# Patient Record
Sex: Female | Born: 1965
Health system: Southern US, Community
[De-identification: ages and names within clinical notes are randomized; demographics above are authoritative.]

## PROBLEM LIST (undated history)

## (undated) DIAGNOSIS — F32A Depression, unspecified: Secondary | ICD-10-CM

## (undated) DIAGNOSIS — K219 Gastro-esophageal reflux disease without esophagitis: Secondary | ICD-10-CM

## (undated) DIAGNOSIS — K5140 Inflammatory polyps of colon without complications: Secondary | ICD-10-CM

## (undated) DIAGNOSIS — I10 Essential (primary) hypertension: Secondary | ICD-10-CM

## (undated) DIAGNOSIS — C44310 Basal cell carcinoma of skin of unspecified parts of face: Secondary | ICD-10-CM

## (undated) DIAGNOSIS — G4730 Sleep apnea, unspecified: Secondary | ICD-10-CM

## (undated) DIAGNOSIS — C4359 Malignant melanoma of other part of trunk: Secondary | ICD-10-CM

## (undated) DIAGNOSIS — F329 Major depressive disorder, single episode, unspecified: Secondary | ICD-10-CM

## (undated) HISTORY — DX: Basal cell carcinoma of skin of unspecified parts of face: C44.310

## (undated) HISTORY — DX: Malignant melanoma of other part of trunk: C43.59

## (undated) HISTORY — DX: Inflammatory polyps of colon without complications: K51.40

## (undated) HISTORY — DX: Gastro-esophageal reflux disease without esophagitis: K21.9

## (undated) HISTORY — DX: Depression, unspecified: F32.A

## (undated) HISTORY — DX: Essential (primary) hypertension: I10

## (undated) HISTORY — DX: Major depressive disorder, single episode, unspecified: F32.9

## (undated) HISTORY — DX: Sleep apnea, unspecified: G47.30

---
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
## 2003-06-21 HISTORY — PX: CHOLECYSTECTOMY: SHX55

## 2008-11-17 ENCOUNTER — Ambulatory Visit: Payer: Self-pay | Admitting: Unknown Physician Specialty

## 2009-04-18 ENCOUNTER — Ambulatory Visit: Payer: Self-pay | Admitting: Internal Medicine

## 2009-04-29 ENCOUNTER — Ambulatory Visit: Payer: Self-pay | Admitting: Internal Medicine

## 2009-07-14 ENCOUNTER — Ambulatory Visit: Payer: Self-pay | Admitting: Internal Medicine

## 2009-08-12 ENCOUNTER — Ambulatory Visit: Payer: Self-pay | Admitting: Family Medicine

## 2010-01-26 ENCOUNTER — Ambulatory Visit: Payer: Self-pay | Admitting: Sports Medicine

## 2014-04-25 LAB — HM COLONOSCOPY

## 2014-06-24 DIAGNOSIS — R159 Full incontinence of feces: Secondary | ICD-10-CM | POA: Insufficient documentation

## 2014-06-24 DIAGNOSIS — C44319 Basal cell carcinoma of skin of other parts of face: Secondary | ICD-10-CM | POA: Insufficient documentation

## 2014-06-24 DIAGNOSIS — K6289 Other specified diseases of anus and rectum: Secondary | ICD-10-CM | POA: Insufficient documentation

## 2014-12-25 ENCOUNTER — Ambulatory Visit: Payer: Self-pay | Admitting: Urology

## 2014-12-29 ENCOUNTER — Ambulatory Visit: Payer: Self-pay

## 2015-11-02 ENCOUNTER — Ambulatory Visit (INDEPENDENT_AMBULATORY_CARE_PROVIDER_SITE_OTHER): Payer: 59 | Admitting: Internal Medicine

## 2015-11-02 ENCOUNTER — Encounter: Payer: Self-pay | Admitting: Internal Medicine

## 2015-11-02 VITALS — BP 134/80 | HR 84 | Ht 64.0 in | Wt 194.0 lb

## 2015-11-02 DIAGNOSIS — R05 Cough: Secondary | ICD-10-CM

## 2015-11-02 DIAGNOSIS — G4733 Obstructive sleep apnea (adult) (pediatric): Secondary | ICD-10-CM | POA: Diagnosis not present

## 2015-11-02 DIAGNOSIS — R0602 Shortness of breath: Secondary | ICD-10-CM | POA: Diagnosis not present

## 2015-11-02 DIAGNOSIS — Z86010 Personal history of colonic polyps: Secondary | ICD-10-CM | POA: Insufficient documentation

## 2015-11-02 DIAGNOSIS — G4730 Sleep apnea, unspecified: Secondary | ICD-10-CM | POA: Diagnosis not present

## 2015-11-02 DIAGNOSIS — R059 Cough, unspecified: Secondary | ICD-10-CM | POA: Insufficient documentation

## 2015-11-02 DIAGNOSIS — Z79899 Other long term (current) drug therapy: Secondary | ICD-10-CM | POA: Insufficient documentation

## 2015-11-02 MED ORDER — FLUTICASONE PROPIONATE HFA 110 MCG/ACT IN AERO: 2.0000 | INHALATION_SPRAY | Freq: Two times a day (BID) | RESPIRATORY_TRACT | Status: DC

## 2015-11-02 MED ORDER — GLYCOPYRROLATE-FORMOTEROL 9-4.8 MCG/ACT IN AERO: 2.0000 | INHALATION_SPRAY | Freq: Two times a day (BID) | RESPIRATORY_TRACT | Status: AC

## 2015-11-02 MED ORDER — GLYCOPYRROLATE-FORMOTEROL 9-4.8 MCG/ACT IN AERO: 2.0000 | INHALATION_SPRAY | Freq: Two times a day (BID) | RESPIRATORY_TRACT | Status: DC

## 2015-11-02 NOTE — Patient Instructions (Signed)

## 2015-11-02 NOTE — Progress Notes (Deleted)
.  [id number]      REVIEW OF SYSTEMS   Review of Systems  Constitutional: Negative for fever, chills, weight loss, malaise/fatigue and diaphoresis.  HENT: Negative for congestion and hearing loss.   Eyes: Negative for blurred vision and double vision.  Respiratory: Positive for cough, shortness of breath and wheezing.   Cardiovascular: Negative for chest pain, palpitations and orthopnea.  Gastrointestinal: Negative for heartburn, nausea, vomiting, abdominal pain, diarrhea, constipation and blood in stool.  Genitourinary: Negative for dysuria and urgency.  Musculoskeletal: Negative for myalgias, back pain and neck pain.  Skin: Negative for rash.  Neurological: Negative for dizziness, tingling, tremors, weakness and headaches.  Endo/Heme/Allergies: Does not bruise/bleed easily.  Psychiatric/Behavioral: Negative for depression, suicidal ideas and substance abuse.  All other systems reviewed and are negative.    VS: BP 134/80 mmHg  Pulse 84  Ht 5\' 4"  (1.626 m)  Wt 194 lb (87.998 kg)  BMI 33.28 kg/m2  SpO2 97%     PHYSICAL EXAM  Physical Exam  Constitutional: She is oriented to person, place, and time. She appears well-developed and well-nourished. She appears distressed.  HENT:  Head: Normocephalic and atraumatic.  Mouth/Throat: No oropharyngeal exudate.  Eyes: EOM are normal. Pupils are equal, round, and reactive to light. No scleral icterus.  Neck: Normal range of motion. Neck supple.  Cardiovascular: Normal rate, regular rhythm and normal heart sounds.   No murmur heard. Pulmonary/Chest: No stridor. She is in respiratory distress. She has wheezes.  Abdominal: Soft. Bowel sounds are normal. She exhibits no distension. There is no tenderness. There is no rebound.  Musculoskeletal: Normal range of motion. She exhibits no edema.  Neurological: She is alert and oriented to person, place, and time. She displays normal reflexes. Coordination normal.  Skin: Skin is warm. She is  diaphoretic.  Psychiatric: She has a normal mood and affect.

## 2015-11-02 NOTE — Progress Notes (Signed)
[facility] Consultation      Date: 11/02/2015,   MRN# [id number] CHAIRTY Terrell [birth year] Code Status:  Code Status History    This patient does not have a recorded code status. Please follow your organizational policy for patients in this situation.     Hosp day:@LENGTHOFSTAYDAYS @ Referring MD: @ATDPROV @     PCP:      AdmissionWeight: 194 lb (87.998 kg)                 CurrentWeight: 194 lb (87.998 kg) Brandi Terrell is a 50 y.o. old female seen in consultation for cough at the request of [place]:  Cough/SOB   HISTORY OF PRESENT ILLNESS  50 yo white female seen today for chronic SOB and DOE. Her symptoms have been going on for many years Has has been noticing that it has been progressively worsening over the past several months  No associated leg swelling. No sign so infection at this time  Patient had recent, what seems to be a viral URI 2 months ago and now has associated  residual cough with productive white phlegm with intermittent wheezing Patient has known allergic rhinitis and is NON compliant with antihistamine and intranasal steroids Patient has known dx of OSA and is noncompliant with CPAP  patient had been given albuterol inhaler which does not seem to work, patient has been given prednisone and abx and that seemed to have helped Patient notes that she has seen ENT for allergies and is noncompliant with their recs She is non-smoker, however has been exposed to second hand smoke for 20 years Patient also has GERD takes PPI, also on ARB for HTN  She is nonsmoker, howevere  PAST MEDICAL HISTORY   Past Medical History  Diagnosis Date  . Hypertension   . Sleep apnea      SURGICAL HISTORY   Past Surgical History  Procedure Laterality Date  . Cholecystectomy    . Appendectomy       FAMILY HISTORY   Family History  Problem Relation Age of Onset  . Hypertension Mother   . Skin cancer Father     . Hypertension Father   . Stroke Maternal Grandmother   . Stroke Paternal Grandmother      SOCIAL HISTORY   Social History  Substance Use Topics  . Smoking status: Never Smoker   . Smokeless tobacco: Never Used  . Alcohol Use: Yes     Comment: occ  second hand smoke exposure   MEDICATIONS    Home Medication:  Current Outpatient Rx  Name  Route  Sig  Dispense  Refill  . amLODipine-valsartan (EXFORGE) 10-160 MG tablet   Oral   Take by mouth.         . Aspirin-Acetaminophen-Caffeine (EXCEDRIN EXTRA STRENGTH PO)   Oral   Take by mouth.         . estradiol (VIVELLE-DOT) 0.1 MG/24HR patch      PLACE 1 PATCH ONTO THE SKIN TWICE A WEEK.      11   . fluticasone (FLONASE) 50 MCG/ACT nasal spray   Each Nare   Place into both nostrils daily.         Marland Kitchen lamoTRIgine (LAMICTAL) 100 MG tablet   Oral   Take 100 mg by mouth.         . metoprolol succinate (TOPROL-XL) 100 MG 24 hr tablet   Oral   Take 100 mg by mouth.         Marland Kitchen  progesterone (PROMETRIUM) 100 MG capsule   Oral   Take 100 mg by mouth.         . zolpidem (AMBIEN) 5 MG tablet   Oral   Take 7.5 mg by mouth.         . fluticasone (FLOVENT HFA) 110 MCG/ACT inhaler   Inhalation   Inhale 2 puffs into the lungs 2 (two) times daily.   1 Inhaler   2   . Glycopyrrolate-Formoterol (BEVESPI AEROSPHERE) 9-4.8 MCG/ACT AERO   Inhalation   Inhale 2 puffs into the lungs 2 (two) times daily.   4.8 g   2   . Glycopyrrolate-Formoterol (BEVESPI AEROSPHERE) 9-4.8 MCG/ACT AERO   Inhalation   Inhale 2 puffs into the lungs 2 (two) times daily.   4.8 g   0     Current Medication:  Current outpatient prescriptions:  .  amLODipine-valsartan (EXFORGE) 10-160 MG tablet, Take by mouth., Disp: , Rfl:  .  Aspirin-Acetaminophen-Caffeine (EXCEDRIN EXTRA STRENGTH PO), Take by mouth., Disp: , Rfl:  .  estradiol (VIVELLE-DOT) 0.1 MG/24HR patch, PLACE 1 PATCH ONTO THE SKIN TWICE A WEEK., Disp: , Rfl: 11 .   fluticasone (FLONASE) 50 MCG/ACT nasal spray, Place into both nostrils daily., Disp: , Rfl:  .  lamoTRIgine (LAMICTAL) 100 MG tablet, Take 100 mg by mouth., Disp: , Rfl:  .  metoprolol succinate (TOPROL-XL) 100 MG 24 hr tablet, Take 100 mg by mouth., Disp: , Rfl:  .  progesterone (PROMETRIUM) 100 MG capsule, Take 100 mg by mouth., Disp: , Rfl:  .  zolpidem (AMBIEN) 5 MG tablet, Take 7.5 mg by mouth., Disp: , Rfl:  .  fluticasone (FLOVENT HFA) 110 MCG/ACT inhaler, Inhale 2 puffs into the lungs 2 (two) times daily., Disp: 1 Inhaler, Rfl: 2 .  Glycopyrrolate-Formoterol (BEVESPI AEROSPHERE) 9-4.8 MCG/ACT AERO, Inhale 2 puffs into the lungs 2 (two) times daily., Disp: 4.8 g, Rfl: 2 .  Glycopyrrolate-Formoterol (BEVESPI AEROSPHERE) 9-4.8 MCG/ACT AERO, Inhale 2 puffs into the lungs 2 (two) times daily., Disp: 4.8 g, Rfl: 0    ALLERGIES   Sulfa antibiotics and Cephalosporins     REVIEW OF SYSTEMS   Review of Systems  Constitutional: Positive for malaise/fatigue. Negative for fever, chills, weight loss and diaphoresis.  HENT: Positive for congestion and ear pain. Negative for hearing loss.   Eyes: Negative for blurred vision and double vision.  Respiratory: Positive for cough, sputum production, shortness of breath and wheezing. Negative for hemoptysis.   Cardiovascular: Negative for chest pain, palpitations, orthopnea and leg swelling.  Gastrointestinal: Negative for heartburn, nausea, vomiting and abdominal pain.  Genitourinary: Negative for dysuria and urgency.  Musculoskeletal: Negative for myalgias.  Skin: Negative for rash.  Neurological: Positive for headaches. Negative for dizziness and weakness.  Endo/Heme/Allergies: Does not bruise/bleed easily.  Psychiatric/Behavioral: Negative for depression, suicidal ideas and substance abuse. The patient is nervous/anxious.   All other systems reviewed and are negative.    VS: BP 134/80 mmHg  Pulse 84  Ht 5\' 4"  (1.626 m)  Wt 194 lb  (87.998 kg)  BMI 33.28 kg/m2  SpO2 97%     PHYSICAL EXAM  Physical Exam  Constitutional: She is oriented to person, place, and time. She appears well-developed and well-nourished. No distress.  HENT:  Head: Normocephalic and atraumatic.  Mouth/Throat: No oropharyngeal exudate.  Eyes: EOM are normal. Pupils are equal, round, and reactive to light. No scleral icterus.  Neck: Normal range of motion. Neck supple.  Cardiovascular: Normal rate, regular rhythm and  normal heart sounds.   No murmur heard. Pulmonary/Chest: No stridor. No respiratory distress. She has no wheezes. She has no rales.  Abdominal: Soft. Bowel sounds are normal.  Musculoskeletal: Normal range of motion. She exhibits no edema.  Neurological: She is alert and oriented to person, place, and time. No cranial nerve deficit.  Skin: Skin is warm. She is not diaphoretic.  Psychiatric: She has a normal mood and affect.            ASSESSMENT/PLAN   50 yo white female seen today for cough with signs and symptoms of allergic rhinitis, with probable underlying reactive airways disease or COPD  1.will need to check PFT/6MWT 2.check ECHO and will consider cardiology consult for stress testing 3.check ONO, recommend restarting CPAP therapy 4.continue PPI 5.recommend antihisamine daily and ntramnasal steroids daily 6.albuterol as needed 7.will start LABA/AC with Bevespi 8.will start  Inhaled steroids with Flovent  Follow up in 1 month for re-assessment, will also need to consider stopping ARB of symptoms persist    The Patient requires high complexity decision making for assessment and support, frequent evaluation and titration of therapies, application of advanced monitoring technologies and extensive interpretation of multiple databases.   Patient  satisfied with Plan of action and management. All questions answered  Corrin Parker, M.D.  [facility]  Medical Director [place] Director [facility] Cardio-Pulmonary Department

## 2015-11-06 ENCOUNTER — Encounter: Payer: Self-pay | Admitting: Urology

## 2015-11-08 ENCOUNTER — Encounter: Payer: Self-pay | Admitting: Internal Medicine

## 2015-11-09 NOTE — Telephone Encounter (Signed)
Pt was not able to send mychart message directly to you.

## 2015-11-17 ENCOUNTER — Other Ambulatory Visit: Payer: 59

## 2015-11-17 ENCOUNTER — Telehealth: Payer: Self-pay

## 2015-11-17 DIAGNOSIS — G4730 Sleep apnea, unspecified: Secondary | ICD-10-CM

## 2015-11-17 NOTE — Telephone Encounter (Signed)
Pt informed of need for 2L 02 @ bedtime. Order placed. Nothing further needed.

## 2015-11-18 ENCOUNTER — Other Ambulatory Visit: Payer: Self-pay

## 2015-11-18 DIAGNOSIS — J449 Chronic obstructive pulmonary disease, unspecified: Secondary | ICD-10-CM

## 2015-11-20 ENCOUNTER — Other Ambulatory Visit: Payer: Self-pay

## 2015-11-20 ENCOUNTER — Ambulatory Visit (INDEPENDENT_AMBULATORY_CARE_PROVIDER_SITE_OTHER): Payer: 59

## 2015-11-20 DIAGNOSIS — R0602 Shortness of breath: Secondary | ICD-10-CM | POA: Diagnosis not present

## 2015-11-25 ENCOUNTER — Ambulatory Visit (INDEPENDENT_AMBULATORY_CARE_PROVIDER_SITE_OTHER): Payer: 59 | Admitting: *Deleted

## 2015-11-25 DIAGNOSIS — R0600 Dyspnea, unspecified: Secondary | ICD-10-CM

## 2015-11-25 DIAGNOSIS — R0602 Shortness of breath: Secondary | ICD-10-CM | POA: Diagnosis not present

## 2015-11-25 DIAGNOSIS — J449 Chronic obstructive pulmonary disease, unspecified: Secondary | ICD-10-CM

## 2015-11-25 LAB — PULMONARY FUNCTION TEST
DL/VA % PRED: 82 %
DL/VA: 3.98 ml/min/mmHg/L
DLCO UNC % PRED: 110 %
DLCO unc: 26.8 ml/min/mmHg
FEF 25-75 PRE: 2.6 L/s
FEF 25-75 Post: 1.52 L/sec
FEF2575-%CHANGE-POST: -41 %
FEF2575-%PRED-PRE: 97 %
FEF2575-%Pred-Post: 57 %
FEV1-%CHANGE-POST: -24 %
FEV1-%PRED-POST: 67 %
FEV1-%Pred-Pre: 89 %
FEV1-PRE: 2.46 L
FEV1-Post: 1.85 L
FEV1FVC-%Change-Post: -22 %
FEV1FVC-%Pred-Pre: 102 %
FEV6-%CHANGE-POST: -1 %
FEV6-%PRED-POST: 85 %
FEV6-%PRED-PRE: 86 %
FEV6-POST: 2.9 L
FEV6-PRE: 2.95 L
FEV6FVC-%PRED-PRE: 103 %
FEV6FVC-%Pred-Post: 103 %
FVC-%CHANGE-POST: -3 %
FVC-%Pred-Post: 82 %
FVC-%Pred-Pre: 85 %
FVC-Post: 2.9 L
FVC-Pre: 2.99 L
POST FEV1/FVC RATIO: 64 %
POST FEV6/FVC RATIO: 100 %
PRE FEV6/FVC RATIO: 100 %
Pre FEV1/FVC ratio: 82 %

## 2015-11-25 NOTE — Progress Notes (Signed)
PFT performed today with Nitrogen washout. 

## 2015-11-25 NOTE — Progress Notes (Signed)
SMW performed today. 

## 2015-12-01 ENCOUNTER — Encounter: Payer: Self-pay | Admitting: Internal Medicine

## 2015-12-01 ENCOUNTER — Ambulatory Visit (INDEPENDENT_AMBULATORY_CARE_PROVIDER_SITE_OTHER): Payer: 59 | Admitting: Internal Medicine

## 2015-12-01 VITALS — BP 124/88 | HR 78 | Ht 64.0 in | Wt 190.0 lb

## 2015-12-01 DIAGNOSIS — J45909 Unspecified asthma, uncomplicated: Secondary | ICD-10-CM

## 2015-12-01 DIAGNOSIS — I5032 Chronic diastolic (congestive) heart failure: Secondary | ICD-10-CM

## 2015-12-01 MED ORDER — CETIRIZINE HCL 10 MG PO TABS: 10.0000 mg | ORAL_TABLET | Freq: Every day | ORAL | Status: DC

## 2015-12-01 NOTE — Progress Notes (Signed)
[facility] Consultation      Date: 12/01/2015,   MRN# [id number] Brandi Terrell [birth year] Code Status:  Code Status History    This patient does not have a recorded code status. Please follow your organizational policy for patients in this situation.     Hosp day:@LENGTHOFSTAYDAYS @ Referring MD: @ATDPROV @     PCP:      Admission                  Current  Brandi Terrell is a 50 y.o. old female seen in consultation for cough at the request of [place]:  Follow up Cough/SOB   HISTORY OF PRESENT ILLNESS  50 yo white female seen today for chronic SOB and DOE. Her symptoms have been going on for many years Has has been noticing that it has been progressively worsening over the past several months but has improved since last visit Cough still persistent but better,   ONO+ uses oxygen at night, has OSA but noncompliant ECHO shows grad 1 diastolic dysfunction  PFT 11/25/15 Ratio 82% FEV1 89% FVC 85% DLCO 110% RV 38% TLC 67%  6MWT WNL  No associated leg swelling. No sign so infection at this time  She is non-smoker, however has been exposed to second hand smoke for 20 years Patient also has GERD takes PPI, also on ARB for HTN  She is nonsmoker, howevere   Current Medication:  Current outpatient prescriptions:  .  amLODipine-valsartan (EXFORGE) 10-160 MG tablet, Take by mouth., Disp: , Rfl:  .  Aspirin-Acetaminophen-Caffeine (EXCEDRIN EXTRA STRENGTH PO), Take by mouth., Disp: , Rfl:  .  estradiol (VIVELLE-DOT) 0.1 MG/24HR patch, PLACE 1 PATCH ONTO THE SKIN TWICE A WEEK., Disp: , Rfl: 11 .  fluticasone (FLONASE) 50 MCG/ACT nasal spray, Place into both nostrils daily., Disp: , Rfl:  .  fluticasone (FLOVENT HFA) 110 MCG/ACT inhaler, Inhale 2 puffs into the lungs 2 (two) times daily., Disp: 1 Inhaler, Rfl: 2 .  Glycopyrrolate-Formoterol (BEVESPI AEROSPHERE) 9-4.8 MCG/ACT AERO, Inhale 2 puffs into the lungs 2 (two) times  daily., Disp: 4.8 g, Rfl: 2 .  lamoTRIgine (LAMICTAL) 100 MG tablet, Take 100 mg by mouth., Disp: , Rfl:  .  metoprolol succinate (TOPROL-XL) 100 MG 24 hr tablet, Take 100 mg by mouth., Disp: , Rfl:  .  progesterone (PROMETRIUM) 100 MG capsule, Take 100 mg by mouth., Disp: , Rfl:  .  zolpidem (AMBIEN) 5 MG tablet, Take 7.5 mg by mouth., Disp: , Rfl:     ALLERGIES   Sulfa antibiotics and Cephalosporins     REVIEW OF SYSTEMS   Review of Systems  Constitutional: Negative for fever, chills, weight loss, malaise/fatigue and diaphoresis.  HENT: Positive for congestion.   Eyes: Negative for blurred vision and double vision.  Respiratory: Positive for cough and shortness of breath. Negative for hemoptysis, sputum production and wheezing.   Cardiovascular: Negative for chest pain, palpitations, orthopnea and leg swelling.  Gastrointestinal: Negative for heartburn, nausea and vomiting.  Genitourinary: Negative for urgency and frequency.  Skin: Negative for rash.  All other systems reviewed and are negative.   BP 124/88 mmHg  Pulse 78  Ht 5\' 4"  (1.626 m)  Wt 190 lb (86.183 kg)  BMI 32.60 kg/m2  SpO2 97%    PHYSICAL EXAM  Physical Exam  Constitutional: She is oriented to person, place, and time. No distress.  HENT:  Mouth/Throat: No oropharyngeal exudate.  Cardiovascular: Normal rate, regular rhythm  and normal heart sounds.   No murmur heard. Pulmonary/Chest: Effort normal and breath sounds normal. No stridor. No respiratory distress. She has no wheezes. She has no rales.  Musculoskeletal: Normal range of motion. She exhibits no edema.  Neurological: She is alert and oriented to person, place, and time. No cranial nerve deficit.  Skin: Skin is warm. She is not diaphoretic.  Psychiatric: She has a normal mood and affect.        ASSESSMENT/PLAN   50 yo white female seen today for cough with signs and symptoms of allergic rhinitis, with  reactive airways disease with  recurrent bouts of URI, assocaited with noncompliant OSA with chronic resp failure with nocturnal hypoxia   1.continue bevespi and FLovent 2.consider cardiology consult for stress testing and grade 1 diastolic dysfunction 3.continue PPI 4.recommend antihisamine daily with Zyrtec-prescription given 5.will obtain CT chest to assess for underlying ILD 6.recommen ENT referral 7.patient to be assessed for dental appliance for OSA   Follow up in 2 month for re-assessment   The Patient requires high complexity decision making for assessment and support, frequent evaluation and titration of therapies, application of advanced monitoring technologies and extensive interpretation of multiple databases.   Patient  satisfied with Plan of action and management. All questions answered  Corrin Parker, M.D.  [facility]  Medical Director [place] Director [facility] Cardio-Pulmonary Department

## 2015-12-01 NOTE — Patient Instructions (Signed)

## 2015-12-24 ENCOUNTER — Ambulatory Visit (INDEPENDENT_AMBULATORY_CARE_PROVIDER_SITE_OTHER): Payer: 59 | Admitting: Cardiology

## 2015-12-24 ENCOUNTER — Encounter: Payer: Self-pay | Admitting: Cardiology

## 2015-12-24 VITALS — BP 120/80 | HR 77 | Ht 64.0 in | Wt 190.8 lb

## 2015-12-24 DIAGNOSIS — E669 Obesity, unspecified: Secondary | ICD-10-CM

## 2015-12-24 DIAGNOSIS — R079 Chest pain, unspecified: Secondary | ICD-10-CM

## 2015-12-24 DIAGNOSIS — R0602 Shortness of breath: Secondary | ICD-10-CM | POA: Diagnosis not present

## 2015-12-24 DIAGNOSIS — I10 Essential (primary) hypertension: Secondary | ICD-10-CM | POA: Diagnosis not present

## 2015-12-24 NOTE — Progress Notes (Signed)
Cardiology Office Note   Date:  12/24/2015   ID:  Brandi Terrell, DOB [birth year], MRN [id number]  Referring Doctor:  Ezequiel Kayser, MD   Cardiologist:   Wende Bushy, MD   Reason for consultation:  Evaluation for shortness of breath    History of Present Illness: Brandi Terrell is a 50 y.o. female who presents for shortness of breath  Shortness of breath has been going on for 2 years. This is moderate to severe in intensity, mainly in the chest, none radiating, minutes at a time, resolved with rest. Shortness of breath occurs mainly with exertion. Also happens with bending downwards. Occurs with walking up hill or walking faster than her usual pace. Associated with chest tightness.  Beginning of this year, she experienced prolonged cough productive of phlegm. He ended up seeing pulmonary service. Also during this time, she was diagnosed with sleep apnea. She could not tolerate CPAP. She is in the process of consulting with ENT and dental service to see if a dental orthotic will help with her sleep apnea.  ROS:  Please see the history of present illness. Aside from mentioned under HPI, all other systems are reviewed and negative.     Past Medical History  Diagnosis Date  . Hypertension   . Sleep apnea     Past Surgical History  Procedure Laterality Date  . Cholecystectomy    . Appendectomy       reports that she has never smoked. She has never used smokeless tobacco. She reports that she drinks alcohol. She reports that she does not use illicit drugs.   family history includes Hypertension in her father and mother; Skin cancer in her father; Stroke in her maternal grandmother and paternal grandmother.   Outpatient Prescriptions Prior to Visit  Medication Sig Dispense Refill  . amLODipine-valsartan (EXFORGE) 10-160 MG tablet Take by mouth.    . cetirizine (ZYRTEC) 10 MG tablet Take 1 tablet (10 mg total) by mouth at bedtime. 30 tablet 6  . estradiol (VIVELLE-DOT) 0.1  MG/24HR patch PLACE 1 PATCH ONTO THE SKIN TWICE A WEEK.  11  . fluticasone (FLOVENT HFA) 110 MCG/ACT inhaler Inhale 2 puffs into the lungs 2 (two) times daily. 1 Inhaler 2  . Glycopyrrolate-Formoterol (BEVESPI AEROSPHERE) 9-4.8 MCG/ACT AERO Inhale 2 puffs into the lungs 2 (two) times daily. 4.8 g 2  . lamoTRIgine (LAMICTAL) 100 MG tablet Take 100 mg by mouth.    . metoprolol succinate (TOPROL-XL) 100 MG 24 hr tablet Take 100 mg by mouth.    . zolpidem (AMBIEN) 5 MG tablet Take 7.5 mg by mouth.    . progesterone (PROMETRIUM) 100 MG capsule Take 100 mg by mouth.    . Aspirin-Acetaminophen-Caffeine (EXCEDRIN EXTRA STRENGTH PO) Take by mouth.    . fluticasone (FLONASE) 50 MCG/ACT nasal spray Place into both nostrils daily.    Marland Kitchen loratadine (CLARITIN) 10 MG tablet Take 10 mg by mouth daily.     No facility-administered medications prior to visit.     Allergies: Sulfa antibiotics and Cephalosporins    PHYSICAL EXAM: VS:  BP 120/80 mmHg  Pulse 77  Ht 5\' 4"  (1.626 m)  Wt 190 lb 12.8 oz (86.546 kg)  BMI 32.73 kg/m2 , Body mass index is 32.73 kg/(m^2). Wt Readings from Last 3 Encounters:  12/24/15 190 lb 12.8 oz (86.546 kg)  12/01/15 190 lb (86.183 kg)  11/02/15 194 lb (87.998 kg)    GENERAL:  well developed, well nourished, obese, not in acute  distress HEENT: normocephalic, pink conjunctivae, anicteric sclerae, no xanthelasma, normal dentition, oropharynx clear NECK:  no neck vein engorgement, JVP normal, no hepatojugular reflux, carotid upstroke brisk and symmetric, no bruit, no thyromegaly, no lymphadenopathy LUNGS:  good respiratory effort, clear to auscultation bilaterally CV:  PMI not displaced, no thrills, no lifts, S1 and S2 within normal limits, no palpable S3 or S4, no murmurs, no rubs, no gallops ABD:  Soft, nontender, nondistended, normoactive bowel sounds, no abdominal aortic bruit, no hepatomegaly, no splenomegaly MS: nontender back, no kyphosis, no scoliosis, no joint  deformities EXT:  2+ DP/PT pulses, no edema, no varicosities, no cyanosis, no clubbing SKIN: warm, nondiaphoretic, normal turgor, no ulcers NEUROPSYCH: alert, oriented to person, place, and time, sensory/motor grossly intact, normal mood, appropriate affect  Recent Labs: No results found for requested labs within last 365 days.   Lipid Panel No results found for: CHOL, TRIG, HDL, CHOLHDL, VLDL, LDLCALC, LDLDIRECT   Other studies Reviewed:  EKG:  The ekg from 12/24/2015 was personally reviewed by me and it revealed sinus rhythm, 77 BPM. Nonspecific ST-T wave changes.  Additional studies/ records that were reviewed personally reviewed by me today include:  Echo 11/20/2015: Left ventricle: The cavity size was normal. Systolic function was  normal. The estimated ejection fraction was in the range of 60%  to 65%. Wall motion was normal; there were no regional wall  motion abnormalities. Doppler parameters are consistent with  abnormal left ventricular relaxation (grade 1 diastolic  dysfunction). - Aortic valve: There was trivial regurgitation. - Left atrium: The atrium was mildly dilated. - Right ventricle: Systolic function was normal. - Pulmonary arteries: Systolic pressure was within the normal  range.  ASSESSMENT AND PLAN:  Shortness of breath Chest tightness No evidence of congestive heart failure on exam and on history. Risk factors for seen right ear include hypertension, possible family history. Recommend further evaluation with nuclear stress testing. Will attempt exercise protocol. If patient unable to achieve target heart rate, we will need to switch to pharmacologic nuclear stress test.  Hypertension Blood pressure within normal limits now. Recommend blood pressure log. Goal is less than 140/80.  Aortic insufficiency Trivial AI noted on echo. No appreciable murmur. Likely related to blood pressure control.  Obesity Body mass index is 32.73 kg/(m^2).Marland Kitchen  Recommend aggressive weight loss through diet and increased physical activity. Once cardiac workup is done  Current medicines are reviewed at length with the patient today.  The patient does not have concerns regarding medicines.  Labs/ tests ordered today include:  Orders Placed This Encounter  Procedures  . NM Myocar Multi W/Spect W/Wall Motion / EF  . EKG 12-Lead    I had a lengthy and detailed discussion with the patient regarding diagnoses, prognosis, diagnostic options, treatment options.   I counseled the patient on importance of lifestyle modification including heart healthy diet, regular physical activity.   Disposition:   FU with undersigned after tests   Signed, Wende Bushy, MD  12/24/2015 12:36 PM    [place]

## 2015-12-24 NOTE — Patient Instructions (Addendum)
Medication Instructions:  Your physician recommends that you continue on your current medications as directed. Please refer to the Current Medication list given to you today.   Labwork: None ordered  Testing/Procedures: [place]  Your caregiver has ordered a Stress Test with nuclear imaging. The purpose of this test is to evaluate the blood supply to your heart muscle. Cardiac stress tests are done to find areas of poor blood flow to the heart by determining the extent of coronary artery disease (CAD).    Please note: these test may take anywhere between 2-4 hours to complete  PLEASE REPORT TO [place] AT THE FIRST DESK WILL DIRECT YOU WHERE TO GO  Date of Procedure:_Wednesday January 06, 2016 at 07:30AM__  Arrival Time for Procedure:_Arrive at 07:15AM to register__  Instructions regarding medication:   __X__ : Hold diabetes medication morning of procedure  __X__:  Hold Metoprolol the night before procedure and morning of procedure    PLEASE NOTIFY THE OFFICE AT LEAST 24 HOURS IN ADVANCE IF YOU ARE UNABLE TO KEEP YOUR APPOINTMENT.  [phone] AND  PLEASE NOTIFY NUCLEAR MEDICINE AT [facility] AT LEAST 24 HOURS IN ADVANCE IF YOU ARE UNABLE TO KEEP YOUR APPOINTMENT. [phone]  How to prepare for your Myoview test:   Do not eat or drink after midnight  No caffeine for 24 hours prior to test  No smoking 24 hours prior to test.  Your medication may be taken with water.  If your doctor stopped a medication because of this test, do not take that medication.  Ladies, please do not wear dresses.  Skirts or pants are appropriate. Please wear a short sleeve shirt.  No perfume, cologne or lotion.  Wear comfortable walking shoes. No heels!   Follow-Up: Your physician recommends that you schedule a follow-up appointment after testing with Dr. Yvone Neu.  It was a pleasure seeing you today here in the office. Please do not hesitate to give [name] a call  back if you have any further questions. [place], BSN      Any Other Special Instructions Will Be Listed Below (If Applicable).     If you need a refill on your cardiac medications before your next appointment, please call your pharmacy.  Cardiac Nuclear Scanning A cardiac nuclear scan is used to check your heart for problems, such as the following:  A portion of the heart is not getting enough blood.  Part of the heart muscle has died, which happens with a heart attack.  The heart wall is not working normally.  In this test, a radioactive dye (tracer) is injected into your bloodstream. After the tracer has traveled to your heart, a scanning device is used to measure how much of the tracer is absorbed by or distributed to various areas of your heart. LET [facility] CARE PROVIDER KNOW ABOUT:  Any allergies you have.  All medicines you are taking, including vitamins, herbs, eye drops, creams, and over-the-counter medicines.  Previous problems you or members of your family have had with the use of anesthetics.  Any blood disorders you have.  Previous surgeries you have had.  Medical conditions you have.  RISKS AND COMPLICATIONS Generally, this is a safe procedure. However, as with any procedure, problems can occur. Possible problems include:   Serious chest pain.  Rapid heartbeat.  Sensation of warmth in your chest. This usually passes quickly. BEFORE THE PROCEDURE Ask your health care provider about changing or stopping your  regular medicines. PROCEDURE This procedure is usually done at a hospital and takes 2-4 hours.  An IV tube is inserted into one of your veins.  Your health care provider will inject a small amount of radioactive tracer through the tube.  You will then wait for 20-40 minutes while the tracer travels through your bloodstream.  You will lie down on an exam table so images of your heart can be taken. Images will be taken for  about 15-20 minutes.  You will exercise on a treadmill or stationary bike. While you exercise, your heart activity will be monitored with an electrocardiogram (ECG), and your blood pressure will be checked.  If you are unable to exercise, you may be given a medicine to make your heart beat faster.  When blood flow to your heart has peaked, tracer will again be injected through the IV tube.  After 20-40 minutes, you will get back on the exam table and have more images taken of your heart.  When the procedure is over, your IV tube will be removed. AFTER THE PROCEDURE  You will likely be able to leave shortly after the test. Unless your health care provider tells you otherwise, you may return to your normal schedule, including diet, activities, and medicines.  Make sure you find out how and when you will get your test results.   This information is not intended to replace advice given to you by your health care provider. Make sure you discuss any questions you have with your health care provider.   Document Released: 07/01/2004 Document Revised: 06/11/2013 Document Reviewed: 05/15/2013 Elsevier Interactive Patient Education [employer].

## 2016-01-06 ENCOUNTER — Encounter
Admission: RE | Admit: 2016-01-06 | Discharge: 2016-01-06 | Disposition: A | Discharge status: Home / Self Care | Payer: 59 | Source: Ambulatory Visit | Attending: Cardiology | Admitting: Cardiology

## 2016-01-06 DIAGNOSIS — R079 Chest pain, unspecified: Secondary | ICD-10-CM | POA: Insufficient documentation

## 2016-01-06 MED ORDER — TECHNETIUM TC 99M TETROFOSMIN IV KIT
12.0000 | PACK | Freq: Once | INTRAVENOUS | Status: AC | PRN
  Administered 2016-01-06: 12.57 via INTRAVENOUS

## 2016-01-06 MED ORDER — TECHNETIUM TC 99M TETROFOSMIN IV KIT
33.0000 | PACK | Freq: Once | INTRAVENOUS | Status: AC | PRN
  Administered 2016-01-06: 31.65 via INTRAVENOUS

## 2016-01-07 LAB — NM MYOCAR MULTI W/SPECT W/WALL MOTION / EF
CHL CUP NUCLEAR SRS: 12
CHL CUP NUCLEAR SSS: 11
CHL CUP STRESS STAGE 1 GRADE: 0 %
CHL CUP STRESS STAGE 1 HR: 93 {beats}/min
CHL CUP STRESS STAGE 3 HR: 123 {beats}/min
CHL CUP STRESS STAGE 4 SBP: 185 mmHg
CHL CUP STRESS STAGE 4 SPEED: 2.5 mph
CHL CUP STRESS STAGE 5 GRADE: 0 %
CHL CUP STRESS STAGE 5 HR: 131 {beats}/min
CHL CUP STRESS STAGE 6 DBP: 73 mmHg
CHL CUP STRESS STAGE 6 SPEED: 0 mph
CSEPED: 7 min
CSEPEDS: 1 s
CSEPEW: 7 METS
CSEPPBP: 185 mmHg
CSEPPHR: 150 {beats}/min
CSEPPMHR: 89 %
LV sys vol: 21 mL
LVDIAVOL: 56 mL (ref 46–106)
NUC STRESS TID: 0.95
Percent HR: 89 %
Rest HR: 83 {beats}/min
SDS: 1
Stage 1 Speed: 0 mph
Stage 2 Grade: 0 %
Stage 2 HR: 93 {beats}/min
Stage 2 Speed: 0 mph
Stage 3 DBP: 67 mmHg
Stage 3 Grade: 10 %
Stage 3 SBP: 162 mmHg
Stage 3 Speed: 1.7 mph
Stage 4 DBP: 60 mmHg
Stage 4 Grade: 12 %
Stage 4 HR: 150 {beats}/min
Stage 5 Speed: 0 mph
Stage 6 Grade: 0 %
Stage 6 HR: 96 {beats}/min
Stage 6 SBP: 145 mmHg

## 2016-01-20 ENCOUNTER — Ambulatory Visit (INDEPENDENT_AMBULATORY_CARE_PROVIDER_SITE_OTHER): Payer: 59 | Admitting: Cardiology

## 2016-01-20 ENCOUNTER — Encounter: Payer: Self-pay | Admitting: Cardiology

## 2016-01-20 VITALS — BP 120/82 | HR 72 | Ht 64.0 in | Wt 185.8 lb

## 2016-01-20 DIAGNOSIS — I10 Essential (primary) hypertension: Secondary | ICD-10-CM | POA: Diagnosis not present

## 2016-01-20 DIAGNOSIS — R0602 Shortness of breath: Secondary | ICD-10-CM

## 2016-01-20 DIAGNOSIS — I351 Nonrheumatic aortic (valve) insufficiency: Secondary | ICD-10-CM | POA: Diagnosis not present

## 2016-01-20 DIAGNOSIS — E669 Obesity, unspecified: Secondary | ICD-10-CM

## 2016-01-20 NOTE — Patient Instructions (Signed)
Follow-Up: Your physician wants you to follow-up in: 1 year with Dr. Yvone Neu. You will receive a reminder letter in the mail two months in advance. If you don't receive a letter, please call our office to schedule the follow-up appointment.  It was a pleasure seeing you today here in the office. Please do not hesitate to give [name] a call back if you have any further questions. [place], BSN

## 2016-01-20 NOTE — Progress Notes (Signed)
Cardiology Office Note   Date:  01/20/2016   ID:  Brandi Terrell, DOB [birth year], MRN [id number]  Referring Doctor:  Ezequiel Kayser, MD   Cardiologist:   Wende Bushy, MD   Reason for consultation:  Evaluation for shortness of breath    History of Present Illness: Brandi Terrell is a 50 y.o. female who presents for follow-up after testing.  Patient during overall quite well. She continues to experience some mild shortness of breath with exertion. She admits to not having regular exercise. No chest pain  She continues to have some cough with eating which she thinks is related to her GERD.  In terms of sleep apnea, she will be going prescription for dental orthotic for this pain she follows up with ENT and neurology.  Patient denies palpitations, loss of consciousness, PND, orthopnea, edema.  ROS:  Please see the history of present illness. Aside from mentioned under HPI, all other systems are reviewed and negative.     Past Medical History:  Diagnosis Date  . Hypertension   . Sleep apnea     Past Surgical History:  Procedure Laterality Date  . APPENDECTOMY    . CHOLECYSTECTOMY       reports that she has never smoked. She has never used smokeless tobacco. She reports that she drinks alcohol. She reports that she does not use drugs.   family history includes Hypertension in her father and mother; Skin cancer in her father; Stroke in her maternal grandmother and paternal grandmother.   Outpatient Medications Prior to Visit  Medication Sig Dispense Refill  . amLODipine-valsartan (EXFORGE) 10-160 MG tablet Take by mouth.    . cetirizine (ZYRTEC) 10 MG tablet Take 1 tablet (10 mg total) by mouth at bedtime. 30 tablet 6  . estradiol (VIVELLE-DOT) 0.1 MG/24HR patch PLACE 1 PATCH ONTO THE SKIN TWICE A WEEK.  11  . fluticasone (FLOVENT HFA) 110 MCG/ACT inhaler Inhale 2 puffs into the lungs 2 (two) times daily. 1 Inhaler 2  . Glycopyrrolate-Formoterol (BEVESPI AEROSPHERE)  9-4.8 MCG/ACT AERO Inhale 2 puffs into the lungs 2 (two) times daily. 4.8 g 2  . lamoTRIgine (LAMICTAL) 100 MG tablet Take 100 mg by mouth.    . metoprolol succinate (TOPROL-XL) 100 MG 24 hr tablet Take 100 mg by mouth.    . triamcinolone (NASACORT) 55 MCG/ACT AERO nasal inhaler Place 2 sprays into the nose daily.    Marland Kitchen zolpidem (AMBIEN) 5 MG tablet Take 7.5 mg by mouth.    . progesterone (PROMETRIUM) 100 MG capsule Take 100 mg by mouth.     No facility-administered medications prior to visit.      Allergies: Sulfa antibiotics and Cephalosporins    PHYSICAL EXAM: VS:  BP 120/82 (BP Location: Left Arm, Patient Position: Sitting, Cuff Size: Normal)   Pulse 72   Ht 5\' 4"  (1.626 m)   Wt 185 lb 12 oz (84.3 kg)   LMP 12/31/2015 (Exact Date)   BMI 31.88 kg/m  , Body mass index is 31.88 kg/m. Wt Readings from Last 3 Encounters:  01/20/16 185 lb 12 oz (84.3 kg)  12/24/15 190 lb 12.8 oz (86.5 kg)  12/01/15 190 lb (86.2 kg)    GENERAL:  well developed, well nourished, obese, not in acute distress HEENT: normocephalic, pink conjunctivae, anicteric sclerae, no xanthelasma, normal dentition, oropharynx clear NECK:  no neck vein engorgement, JVP normal, no hepatojugular reflux, carotid upstroke brisk and symmetric, no bruit, no thyromegaly, no lymphadenopathy LUNGS:  good respiratory effort,  clear to auscultation bilaterally CV:  PMI not displaced, no thrills, no lifts, S1 and S2 within normal limits, no palpable S3 or S4, no murmurs, no rubs, no gallops ABD:  Soft, nontender, nondistended, normoactive bowel sounds, no abdominal aortic bruit, no hepatomegaly, no splenomegaly MS: nontender back, no kyphosis, no scoliosis, no joint deformities EXT:  2+ DP/PT pulses, no edema, no varicosities, no cyanosis, no clubbing SKIN: warm, nondiaphoretic, normal turgor, no ulcers NEUROPSYCH: alert, oriented to person, place, and time, sensory/motor grossly intact, normal mood, appropriate affect  Recent  Labs: No results found for requested labs within last 8760 hours.   Lipid Panel No results found for: CHOL, TRIG, HDL, CHOLHDL, VLDL, LDLCALC, LDLDIRECT   Other studies Reviewed:  EKG:  The ekg from 12/24/2015 was personally reviewed by me and it revealed sinus rhythm, 77 BPM. Nonspecific ST-T wave changes.  Additional studies/ records that were reviewed personally reviewed by me today include:  Echo 11/20/2015: Left ventricle: The cavity size was normal. Systolic function was  normal. The estimated ejection fraction was in the range of 60%  to 65%. Wall motion was normal; there were no regional wall  motion abnormalities. Doppler parameters are consistent with  abnormal left ventricular relaxation (grade 1 diastolic  dysfunction). - Aortic valve: There was trivial regurgitation. - Left atrium: The atrium was mildly dilated. - Right ventricle: Systolic function was normal. - Pulmonary arteries: Systolic pressure was within the normal  range.  Nuclear stress test 01/07/2016: Exercise myocardial perfusion imaging study with no significant  Ischemia Breast attenuation artifact noted, also with GI uptake artifact Normal wall motion, EF estimated at 70% No EKG changes concerning for ischemia at peak stress or in recovery. Target heart rate achieved, 89% of maximum predicted heart rate, exercised for 7 minutes, achieved 7 METS Low risk scan  ASSESSMENT AND PLAN:  Shortness of breath No evidence of congestive heart failure on exam and on history. Risk factors for CAD include hypertension, possible family history. No evidence of ischemia on exercise nuclear stress test. Discussed findings of this. Likelihood of clinically significant CAD based on normal stresses is low. Patient reassured Likely her symptoms are from deconditioning. Recommend lifestyle changes including increasing physical activity for at least 30 minutes a day, at least 4 times a week. Patient verbalized  understanding and agreed with plan.  Hypertension Blood pressure within normal limits now. Recommend blood pressure log. Goal is less than 140/80.  Aortic insufficiency Trivial AI noted on echo. No appreciable murmur. Likely related to blood pressure control. Follow-up in a year.  Obesity Body mass index is 31.88 kg/m.Marland Kitchen Recommend aggressive weight loss through diet and increased physical activity.   Current medicines are reviewed at length with the patient today.  The patient does not have concerns regarding medicines.  Labs/ tests ordered today include:  No orders of the defined types were placed in this encounter.   I had a lengthy and detailed discussion with the patient regarding diagnoses, prognosis, diagnostic options, treatment options. I spent at least 40 minutes with the patient today and more than 50% of the time was spent counseling the patient and coordinating care.   I counseled the patient on importance of lifestyle modification including heart healthy diet, regular physical activity.   Disposition:   FU with undersigned in one year   Signed, Wende Bushy, MD  01/20/2016 10:55 AM    [place]

## 2016-01-21 ENCOUNTER — Ambulatory Visit
Admission: RE | Admit: 2016-01-21 | Discharge: 2016-01-21 | Disposition: A | Discharge status: Home / Self Care | Payer: 59 | Source: Ambulatory Visit | Attending: Internal Medicine | Admitting: Internal Medicine

## 2016-01-21 DIAGNOSIS — J45909 Unspecified asthma, uncomplicated: Secondary | ICD-10-CM | POA: Diagnosis not present

## 2016-01-21 DIAGNOSIS — M419 Scoliosis, unspecified: Secondary | ICD-10-CM | POA: Diagnosis not present

## 2016-02-03 ENCOUNTER — Ambulatory Visit (INDEPENDENT_AMBULATORY_CARE_PROVIDER_SITE_OTHER): Payer: 59 | Admitting: Internal Medicine

## 2016-02-03 ENCOUNTER — Encounter: Payer: Self-pay | Admitting: Internal Medicine

## 2016-02-03 VITALS — BP 130/76 | HR 80 | Ht 64.0 in | Wt 188.6 lb

## 2016-02-03 DIAGNOSIS — J45909 Unspecified asthma, uncomplicated: Secondary | ICD-10-CM | POA: Diagnosis not present

## 2016-02-03 MED ORDER — FLUTICASONE FUROATE-VILANTEROL 100-25 MCG/INH IN AEPB: 1.0000 | INHALATION_SPRAY | Freq: Every day | RESPIRATORY_TRACT | 5 refills | Status: DC

## 2016-02-03 MED ORDER — FLUTICASONE FUROATE-VILANTEROL 100-25 MCG/INH IN AEPB: 1.0000 | INHALATION_SPRAY | Freq: Every day | RESPIRATORY_TRACT | 0 refills | Status: AC

## 2016-02-03 NOTE — Patient Instructions (Signed)
Stop bevespi Start BReo Follow up with ENT     Asthma, Acute Bronchospasm Acute bronchospasm caused by asthma is also referred to as an asthma attack. Bronchospasm means your air passages become narrowed. The narrowing is caused by inflammation and tightening of the muscles in the air tubes (bronchi) in your lungs. This can make it hard to breathe or cause you to wheeze and cough. CAUSES Possible triggers are:  Animal dander from the skin, hair, or feathers of animals.  Dust mites contained in house dust.  Cockroaches.  Pollen from trees or grass.  Mold.  Cigarette or tobacco smoke.  Air pollutants such as dust, household cleaners, hair sprays, aerosol sprays, paint fumes, strong chemicals, or strong odors.  Cold air or weather changes. Cold air may trigger inflammation. Winds increase molds and pollens in the air.  Strong emotions such as crying or laughing hard.  Stress.  Certain medicines such as aspirin or beta-blockers.  Sulfites in foods and drinks, such as dried fruits and wine.  Infections or inflammatory conditions, such as a flu, cold, or inflammation of the nasal membranes (rhinitis).  Gastroesophageal reflux disease (GERD). GERD is a condition where stomach acid backs up into your esophagus.  Exercise or strenuous activity. SIGNS AND SYMPTOMS   Wheezing.  Excessive coughing, particularly at night.  Chest tightness.  Shortness of breath. DIAGNOSIS  Your health care provider will ask you about your medical history and perform a physical exam. A chest X-ray or blood testing may be performed to look for other causes of your symptoms or other conditions that may have triggered your asthma attack. TREATMENT  Treatment is aimed at reducing inflammation and opening up the airways in your lungs. Most asthma attacks are treated with inhaled medicines. These include quick relief or rescue medicines (such as bronchodilators) and controller medicines (such as  inhaled corticosteroids). These medicines are sometimes given through an inhaler or a nebulizer. Systemic steroid medicine taken by mouth or given through an IV tube also can be used to reduce the inflammation when an attack is moderate or severe. Antibiotic medicines are only used if a bacterial infection is present.  HOME CARE INSTRUCTIONS   Rest.  Drink plenty of liquids. This helps the mucus to remain thin and be easily coughed up. Only use caffeine in moderation and do not use alcohol until you have recovered from your illness.  Do not smoke. Avoid being exposed to secondhand smoke.  You play a critical role in keeping yourself in good health. Avoid exposure to things that cause you to wheeze or to have breathing problems.  Keep your medicines up-to-date and available. Carefully follow your health care provider's treatment plan.  Take your medicine exactly as prescribed.  When pollen or pollution is bad, keep windows closed and use an air conditioner or go to places with air conditioning.  Asthma requires careful medical care. See your health care provider for a follow-up as advised. If you are more than [redacted] weeks pregnant and you were prescribed any new medicines, let your obstetrician know about the visit and how you are doing. Follow up with your health care provider as directed.  After you have recovered from your asthma attack, make an appointment with your outpatient doctor to talk about ways to reduce the likelihood of future attacks. If you do not have a doctor who manages your asthma, make an appointment with a primary care doctor to discuss your asthma. SEEK IMMEDIATE MEDICAL CARE IF:   You are  getting worse.  You have trouble breathing. If severe, call your local emergency services (911 in the [place]  You develop chest pain or discomfort.  You are vomiting.  You are not able to keep fluids down.  You are coughing up yellow, green, brown, or bloody sputum.  You have a  fever and your symptoms suddenly get worse.  You have trouble swallowing. MAKE SURE YOU:   Understand these instructions.  Will watch your condition.  Will get help right away if you are not doing well or get worse.   This information is not intended to replace advice given to you by your health care provider. Make sure you discuss any questions you have with your health care provider.   Document Released: 09/21/2006 Document Revised: 06/11/2013 Document Reviewed: 12/12/2012 Elsevier Interactive Patient Education [employer].

## 2016-02-03 NOTE — Progress Notes (Signed)
[facility] Consultation      Date: 02/03/2016,   MRN# [id number] Brandi Terrell September 13, 1965 Code Status:  Code Status History    This patient does not have a recorded code status. Please follow your organizational policy for patients in this situation.     Hosp day:@LENGTHOFSTAYDAYS @ Referring MD: @ATDPROV @     PCP:      AdmissionWeight: 188 lb 9.6 oz (85.5 kg)                 CurrentWeight: 188 lb 9.6 oz (85.5 kg) Brandi Terrell is a 50 y.o. old female seen in consultation for cough at the request of [place]:  Follow up Cough/SOB   HISTORY OF PRESENT ILLNESS  50 yo white female seen today for chronic SOB and DOE. Her symptoms have been going on for many years Has has been noticing that it has been progressively worsening over the past several months but has improved since last visit Cough still persistent but better, SOB better, still with hoarse voice patient has seen ENT -will need to see [facility]  Exposed to mold at home Takes inhalers daily whne she is supposed to take them twice daily  ONO+ uses oxygen at night, has OSA but noncompliant with oxygen and CPAP ECHO shows grad 1 diastolic dysfunction  PFT 11/25/15 Ratio 82% FEV1 89% FVC 85% DLCO 110% RV 38% TLC 67%  6MWT WNL  No associated leg swelling. No sign so infection at this time  She is non-smoker, however has been exposed to second hand smoke for 20 years Patient also has GERD takes PPI, also on ARB for HTN  She is nonsmoker, howevere   Current Medication:  Current Outpatient Prescriptions:  .  amLODipine-valsartan (EXFORGE) 10-160 MG tablet, Take by mouth., Disp: , Rfl:  .  cetirizine (ZYRTEC) 10 MG tablet, Take 1 tablet (10 mg total) by mouth at bedtime., Disp: 30 tablet, Rfl: 6 .  estradiol (VIVELLE-DOT) 0.1 MG/24HR patch, PLACE 1 PATCH ONTO THE SKIN TWICE A WEEK., Disp: , Rfl: 11 .  fluticasone (FLOVENT HFA) 110 MCG/ACT inhaler, Inhale 2 puffs into  the lungs 2 (two) times daily., Disp: 1 Inhaler, Rfl: 2 .  Glycopyrrolate-Formoterol (BEVESPI AEROSPHERE) 9-4.8 MCG/ACT AERO, Inhale 2 puffs into the lungs 2 (two) times daily., Disp: 4.8 g, Rfl: 2 .  lamoTRIgine (LAMICTAL) 100 MG tablet, Take 100 mg by mouth., Disp: , Rfl:  .  metoprolol succinate (TOPROL-XL) 100 MG 24 hr tablet, Take 100 mg by mouth., Disp: , Rfl:  .  omeprazole (PRILOSEC) 20 MG capsule, Take 20 mg by mouth daily., Disp: , Rfl:  .  triamcinolone (NASACORT) 55 MCG/ACT AERO nasal inhaler, Place 2 sprays into the nose daily., Disp: , Rfl:  .  zolpidem (AMBIEN) 5 MG tablet, Take 7.5 mg by mouth., Disp: , Rfl:  .  progesterone (PROMETRIUM) 100 MG capsule, Take 100 mg by mouth., Disp: , Rfl:     ALLERGIES   Sulfa antibiotics and Cephalosporins     REVIEW OF SYSTEMS   Review of Systems  Constitutional: Negative for chills, diaphoresis, fever, malaise/fatigue and weight loss.  HENT: Positive for congestion.   Eyes: Negative for blurred vision and double vision.  Respiratory: Positive for cough and shortness of breath. Negative for hemoptysis, sputum production and wheezing.   Cardiovascular: Negative for chest pain, palpitations, orthopnea and leg swelling.  Gastrointestinal: Negative for heartburn, nausea and vomiting.  Genitourinary: Negative for frequency and  urgency.  Skin: Negative for rash.  All other systems reviewed and are negative.   BP 130/76 (BP Location: Left Arm, Cuff Size: Normal)   Pulse 80   Ht 5\' 4"  (1.626 m)   Wt 188 lb 9.6 oz (85.5 kg)   LMP 12/31/2015 (Exact Date)   SpO2 100%   BMI 32.37 kg/m     PHYSICAL EXAM  Physical Exam  Constitutional: She is oriented to person, place, and time. No distress.  HENT:  Mouth/Throat: No oropharyngeal exudate.  Cardiovascular: Normal rate, regular rhythm and normal heart sounds.   No murmur heard. Pulmonary/Chest: Effort normal and breath sounds normal. No stridor. No respiratory distress. She has no  wheezes. She has no rales.  Musculoskeletal: Normal range of motion. She exhibits no edema.  Neurological: She is alert and oriented to person, place, and time. No cranial nerve deficit.  Skin: Skin is warm. She is not diaphoretic.  Psychiatric: She has a normal mood and affect.     CT chest 8/3 images  reviewed No abnormal findings at this time   ASSESSMENT/PLAN   50 yo white female seen today for cough with signs and symptoms of allergic rhinitis, with  reactive airways disease with recurrent bouts of URI, assocaited with noncompliant OSA with chronic resp failure with nocturnal hypoxia   Patient is NON compliant with oxygen as well  1.Stop bevespi and FLovent, start BREO once daily 2.continue PPI 3.antihisamine daily with Zyrtec- 4.follow up ENt recs-will see vocal cord specialist at [facility] pending 5.patient to be assessed for dental appliance for OSA at [facility]   Follow up in 6 month for re-assessment   The Patient requires high complexity decision making for assessment and support, frequent evaluation and titration of therapies, application of advanced monitoring technologies and extensive interpretation of multiple databases.   Patient  satisfied with Plan of action and management. All questions answered  Corrin Parker, M.D.  [facility]  Medical Director [place] Director [facility]

## 2016-02-03 NOTE — Progress Notes (Signed)
Patient ID: Brandi Terrell, female   DOB: 1965/12/15, 50 y.o.   MRN: [id number] Patient seen in the office today and instructed on use of breo ellipta.  Patient expressed understanding and demonstrated technique.

## 2016-02-03 NOTE — Addendum Note (Signed)
Addended by: Maryanna Shape A on: 02/03/2016 09:25 AM   Modules accepted: Orders

## 2016-02-10 ENCOUNTER — Telehealth: Payer: Self-pay | Admitting: Internal Medicine

## 2016-02-10 DIAGNOSIS — J45909 Unspecified asthma, uncomplicated: Secondary | ICD-10-CM

## 2016-02-10 NOTE — Telephone Encounter (Signed)
Received VM form pt asking to call back. I called back had to LM.

## 2016-02-10 NOTE — Telephone Encounter (Signed)
LMOM for pt to call back.

## 2016-02-10 NOTE — Telephone Encounter (Signed)
Pt would like to discuss oxygen that was prescribed for her. States she would like to d/c it. Please call.

## 2016-02-11 NOTE — Telephone Encounter (Signed)
Pt states she has called [employer] and told them to come pick up the O2. She states she can't tolerate it and it has caused nose irritation. She also states she seen ENT and they have referred for a dental appliance. Informed pt I would send d/c order for O2 and to keep her f/u appt as scheduled. DK informed. Nothing further needed.

## 2016-03-09 LAB — HM PAP SMEAR: HM PAP: NEGATIVE

## 2016-06-27 ENCOUNTER — Other Ambulatory Visit: Payer: Self-pay | Admitting: *Deleted

## 2016-06-27 DIAGNOSIS — J45909 Unspecified asthma, uncomplicated: Secondary | ICD-10-CM

## 2016-06-27 MED ORDER — CETIRIZINE HCL 10 MG PO TABS: 10.0000 mg | ORAL_TABLET | Freq: Every day | ORAL | 6 refills | Status: DC

## 2016-08-12 ENCOUNTER — Ambulatory Visit: Payer: 59 | Admitting: Internal Medicine

## 2016-08-23 ENCOUNTER — Ambulatory Visit (INDEPENDENT_AMBULATORY_CARE_PROVIDER_SITE_OTHER): Payer: 59 | Admitting: Internal Medicine

## 2016-08-23 ENCOUNTER — Encounter: Payer: Self-pay | Admitting: Internal Medicine

## 2016-08-23 VITALS — BP 126/82 | HR 80 | Wt 192.0 lb

## 2016-08-23 DIAGNOSIS — K219 Gastro-esophageal reflux disease without esophagitis: Secondary | ICD-10-CM

## 2016-08-23 NOTE — Patient Instructions (Signed)
Referral to GI Continue Prilosec Continue Zantac continue zyrtec

## 2016-08-23 NOTE — Progress Notes (Signed)
Brandi Terrell Pul[facility] Medicine Consultation      Date: 08/23/2016,   MRN# [id number] Brandi Terrell 11-15-1965 Code Status:  Code Status History    This patient does not have a recorded code status. Please follow your organizational policy for patients in this situation.     Hosp day:@LENGTHOFSTAYDAYS @ Referring MD: @ATDPROV @     PCP:      AdmissionWeight: 192 lb (87.1 kg)                 CurrentWeight: 192 lb (87.1 kg) Brandi Terrell is a 51 y.o. old female seen in consultation for cough at the request of [facility]  Synopsis: patient seen for chronic SOB and DOE with voice changes and vocal cord weakness with recurrent bouts of URI's Overall, her resp status is stable and most of her symptoms are from her vocal cord disease Patient is noncompliant with nocturnal oxygen and noncompliant with CPAP   CHIEF COMPLAINT:  Follow up Vocal cord dysfunction   HISTORY OF PRESENT ILLNESS  Patient dx with chronic VCD by ENT at [facility] Had been referred to speech therapy Patient has NL PFT's Inhalers do NOT help  ONO+ uses oxygen at night, has OSA but noncompliant with oxygen and CPAP ECHO shows grad 1 diastolic dysfunction  PFT 11/25/15 Ratio 82% FEV1 89% FVC 85% DLCO 110% RV 38% TLC 67%  6MWT WNL  No associated leg swelling. No sign so infection at this time  She is non-smoker, however has been exposed to second hand smoke for 20 years Patient also has GERD takes PPI, also on ARB for HTN  She is nonsmoker, howevere   Current Medication:  Current Outpatient Prescriptions:  .  amLODipine-valsartan (EXFORGE) 10-160 MG tablet, Take by mouth., Disp: , Rfl:  .  cetirizine (ZYRTEC) 10 MG tablet, Take 1 tablet (10 mg total) by mouth at bedtime., Disp: 30 tablet, Rfl: 6 .  estradiol (VIVELLE-DOT) 0.1 MG/24HR patch, PLACE 1 PATCH ONTO THE SKIN TWICE A WEEK., Disp: , Rfl: 11 .  lamoTRIgine (LAMICTAL) 100 MG tablet, Take 100 mg by mouth., Disp: , Rfl:  .  metoprolol succinate  (TOPROL-XL) 100 MG 24 hr tablet, Take 100 mg by mouth., Disp: , Rfl:  .  omeprazole (PRILOSEC) 20 MG capsule, Take 20 mg by mouth daily., Disp: , Rfl:  .  ranitidine (ZANTAC) 150 MG tablet, Take 150 mg by mouth 2 (two) times daily., Disp: , Rfl:  .  zolpidem (AMBIEN) 5 MG tablet, Take 7.5 mg by mouth., Disp: , Rfl:  .  progesterone (PROMETRIUM) 100 MG capsule, Take 100 mg by mouth., Disp: , Rfl:     ALLERGIES   Sulfa antibiotics and Cephalosporins     REVIEW OF SYSTEMS   Review of Systems  Constitutional: Negative for chills, diaphoresis, fever, malaise/fatigue and weight loss.  HENT: Positive for congestion.   Eyes: Negative for blurred vision and double vision.  Respiratory: Positive for cough. Negative for hemoptysis, sputum production, shortness of breath and wheezing.   Cardiovascular: Negative for chest pain, palpitations, orthopnea and leg swelling.  Gastrointestinal: Negative for heartburn, nausea and vomiting.  Genitourinary: Negative for frequency and urgency.  Skin: Negative for rash.  All other systems reviewed and are negative.   BP 126/82 (BP Location: Right Arm, Cuff Size: Normal)   Pulse 80   Wt 192 lb (87.1 kg)   SpO2 96%   BMI 32.96 kg/m     PHYSICAL EXAM  Physical Exam  Constitutional: She is oriented  to person, place, and time. No distress.  HENT:  Mouth/Throat: No oropharyngeal exudate.  Cardiovascular: Normal rate, regular rhythm and normal heart sounds.   No murmur heard. Pulmonary/Chest: Effort normal and breath sounds normal. No stridor. No respiratory distress. She has no wheezes. She has no rales.  Musculoskeletal: Normal range of motion. She exhibits no edema.  Neurological: She is alert and oriented to person, place, and time. No cranial nerve deficit.  Skin: Skin is warm. She is not diaphoretic.  Psychiatric: She has a normal mood and affect.     CT chest 01/21/16 images  reviewed No abnormal findings at this time   ASSESSMENT/PLAN     51 yo white female seen today for follow up chronic cough with signs and symptoms of allergic rhinitis, with  reactive airways disease with recurrent bouts of URI leading to vocal cord dysfunction/weakness, assocaited with noncompliant OSA with chronic resp failure with nocturnal hypoxia   Patient is NON compliant with oxygen as well  1.no inhalers needed at this time 2.continue PPI-will need GI referral for long term GERD and uncontrolled reflux 3.antihisamine daily with Zyrtec- 4.follow up ENT recs-will see vocal cord specialist at [facility]  5.patient to be assessed for dental appliance for OSA at [facility]   Follow up in 6 month for re-assessment   Patient  satisfied with Plan of action and management. All questions answered  Brandi Terrell, M.D.  [facility]  Medical Director [place] Director [facility] Cardio-Pulmonary Department

## 2016-08-24 ENCOUNTER — Ambulatory Visit (INDEPENDENT_AMBULATORY_CARE_PROVIDER_SITE_OTHER): Payer: 59 | Admitting: Gastroenterology

## 2016-08-24 ENCOUNTER — Encounter: Payer: Self-pay | Admitting: Gastroenterology

## 2016-08-24 VITALS — BP 142/86 | Ht 64.0 in | Wt 195.6 lb

## 2016-08-24 DIAGNOSIS — K219 Gastro-esophageal reflux disease without esophagitis: Secondary | ICD-10-CM

## 2016-08-24 NOTE — Progress Notes (Signed)
Gastroenterology Consultation  Referring Provider:     Ezequiel Kayser, MD Primary Care Physician:  Ezequiel Kayser, MD Primary Gastroenterologist:  Dr. Jonathon Bellows  Reason for Consultation:     GERD        HPI:   Brandi Terrell is a 51 y.o. y/o female referred for consultation & management  by Dr. Raechel Ache, DAVID, MD.    She has been referred for GERD.She was seen yesterday by Dr Mortimer Fries for cough . She has previously had her reflux managed by her ENT Dr Beverely Risen.   Reflux:  Onset : many years  Symptoms: after she eats she coughs a lot , at night when she lays down feels a fullness in her chest,when she swallows her saliva, feels better for a few seconds.She also notes a dripping sensation at the back of her throat   Recent weight gain: yes - 20 lbs over the past 4-5 years  Medications: She is on Prilosec  Twice day 20 mg , zantac in the evening. Says in 03/2016 the dosage of prilosec was increased and zantac was added. The symptoms were better yesterday - was recommended to cut down .  Narcotics or anticholinergics use : none PPI /H2 blockers or Antacid  use and timing :takes it at 6.30 am and at 2 pm , she takes it on an empty stomach in the AM and after lunch in the PM Dinner time : later in the day between 6.30-7.30 pm , bed time between 10-11, she does have a snack before bedtime.   Prior EGD: none  Family history of esophageal cancer:none.     Past Medical History:  Diagnosis Date  . Hypertension   . Sleep apnea     Past Surgical History:  Procedure Laterality Date  . APPENDECTOMY    . CHOLECYSTECTOMY      Prior to Admission medications   Medication Sig Start Date End Date Taking? Authorizing Provider  amLODipine-valsartan (EXFORGE) 10-160 MG tablet Take by mouth.    Historical Provider, MD  cetirizine (ZYRTEC) 10 MG tablet Take 1 tablet (10 mg total) by mouth at bedtime. 06/27/16   Flora Lipps, MD  estradiol (VIVELLE-DOT) 0.1 MG/24HR patch PLACE 1 PATCH ONTO THE SKIN TWICE A  WEEK. 10/05/15   Historical Provider, MD  lamoTRIgine (LAMICTAL) 100 MG tablet Take 100 mg by mouth.    Historical Provider, MD  metoprolol succinate (TOPROL-XL) 100 MG 24 hr tablet Take 100 mg by mouth. 10/27/14   Historical Provider, MD  omeprazole (PRILOSEC) 20 MG capsule Take 20 mg by mouth daily.    Historical Provider, MD  progesterone (PROMETRIUM) 100 MG capsule Take 100 mg by mouth. 12/17/14 12/17/15  Historical Provider, MD  ranitidine (ZANTAC) 150 MG tablet Take 150 mg by mouth 2 (two) times daily.    Historical Provider, MD  zolpidem (AMBIEN) 5 MG tablet Take 7.5 mg by mouth.    Historical Provider, MD    Family History  Problem Relation Age of Onset  . Hypertension Mother   . Skin cancer Father   . Hypertension Father   . Stroke Maternal Grandmother   . Stroke Paternal Grandmother      Social History  Substance Use Topics  . Smoking status: Never Smoker  . Smokeless tobacco: Never Used  . Alcohol use Yes     Comment: occ    Allergies as of 08/24/2016 - Review Complete 08/23/2016  Allergen Reaction Noted  . Sulfa antibiotics Hives 11/02/2015  . Cephalosporins Itching and  Rash 11/02/2015    Review of Systems:    All systems reviewed and negative except where noted in HPI.   Physical Exam:  There were no vitals taken for this visit. No LMP recorded. Psych:  Alert and cooperative. Normal mood and affect. General:   Alert,  Well-developed, well-nourished, pleasant and cooperative in NAD Head:  Normocephalic and atraumatic. Eyes:  Sclera clear, no icterus.   Conjunctiva pink. Ears:  Normal auditory acuity. Nose:  No deformity, discharge, or lesions. Mouth:  No deformity or lesions,oropharynx pink & moist. Neck:  Supple; no masses or thyromegaly. Lungs:  Respirations even and unlabored.  Clear throughout to auscultation.   No wheezes, crackles, or rhonchi. No acute distress. Heart:  Regular rate and rhythm; no murmurs, clicks, rubs, or gallops. Abdomen:  Normal bowel  sounds.  No bruits.  Soft, non-tender and non-distended without masses, hepatosplenomegaly or hernias noted.  No guarding or rebound tenderness.    Msk:  Symmetrical without gross deformities. Good, equal movement & strength bilaterally. Pulses:  Normal pulses noted. Extremities:  No clubbing or edema.  No cyanosis. Neurologic:  Alert and oriented x3;  grossly normal neurologically. Skin:  Intact without significant lesions or rashes. No jaundice. Lymph Nodes:  No significant cervical adenopathy. Psych:  Alert and cooperative. Normal mood and affect.  Imaging Studies: No results found.  Assessment and Plan:   Brandi Terrell is a 51 y.o. y/o female has been referred for GERD. The fact that her symotoms are much better with PPI use confirms she has GERD.   Counseled on life style changes, Discussed the risks and benefits of long term PPI use including but not limited to bone loss, chronic kidney disease, infections , low magnesium . Aim to use at the lowest dose for the shortest period of time   Plan   1. Continue Prilosec 20 mg in the morning before meals 2. Stop afternoon dose of Prilosec 3. Continue zantac at night  4. Stop snacks before bed time 5. Use a wedge pillow at night  6. In 6-8 weeks if doing better will plan to stop the morning dose of Prilosec and continue on Zantac only at night.  7. Lose weight    Follow up in 8 weeks   Dr Jonathon Bellows MD

## 2016-10-05 ENCOUNTER — Ambulatory Visit: Payer: 59 | Admitting: Gastroenterology

## 2016-10-24 DIAGNOSIS — E538 Deficiency of other specified B group vitamins: Secondary | ICD-10-CM | POA: Insufficient documentation

## 2016-11-03 ENCOUNTER — Encounter: Payer: Self-pay | Admitting: Gastroenterology

## 2016-11-03 ENCOUNTER — Ambulatory Visit (INDEPENDENT_AMBULATORY_CARE_PROVIDER_SITE_OTHER): Payer: 59 | Admitting: Gastroenterology

## 2016-11-03 VITALS — BP 124/74 | HR 67 | Temp 98.3°F | Ht 64.0 in | Wt 197.0 lb

## 2016-11-03 DIAGNOSIS — K219 Gastro-esophageal reflux disease without esophagitis: Secondary | ICD-10-CM

## 2016-11-03 DIAGNOSIS — F331 Major depressive disorder, recurrent, moderate: Secondary | ICD-10-CM | POA: Insufficient documentation

## 2016-11-03 NOTE — Progress Notes (Signed)
Primary Care Physician: Ezequiel Kayser, MD  Primary Gastroenterologist:  Dr. Jonathon Bellows   Chief Complaint  Patient presents with  . Follow-up    HPI: Brandi Terrell is a 51 y.o. female   She is here today for follow up . She was initially seen on 08/24/16 for GERD whichg has been ongoing for many years, recent weight gain. She is on Prilosec  Twice day 20 mg , zantac in the evening. Says in 03/2016 the dosage of prilosec was increased and zantac was added.    Interval history   08/24/2016-  11/03/2016   Tried prilosec 20 mg in am and zantac 150 mg at night ,doing well and symptoms are well controlled ,  Night time snack is still ongoing , wedge pillow is being used which she feels is working really well, weight has remained stable or rather gained 2 lbs.    Current Outpatient Prescriptions  Medication Sig Dispense Refill  . ALPRAZolam (XANAX) 0.5 MG tablet take 1 tablet by mouth daily if needed for anxiety    . amLODipine-valsartan (EXFORGE) 10-160 MG tablet Take by mouth.    . cetirizine (ZYRTEC) 10 MG tablet Take 1 tablet (10 mg total) by mouth at bedtime. 30 tablet 6  . estradiol (VIVELLE-DOT) 0.1 MG/24HR patch PLACE 1 PATCH ONTO THE SKIN TWICE A WEEK.  11  . etodolac (LODINE) 500 MG tablet Take by mouth.    . lamoTRIgine (LAMICTAL) 100 MG tablet Take 100 mg by mouth.    . metoprolol succinate (TOPROL-XL) 100 MG 24 hr tablet Take 100 mg by mouth.    . Multiple Vitamin (MULTI-VITAMINS) TABS Take by mouth.    Marland Kitchen olopatadine (PATANOL) 0.1 % ophthalmic solution apply 1 drop into both eyes twice a day if needed  0  . omeprazole (PRILOSEC) 20 MG capsule Take 20 mg by mouth daily.    . ranitidine (ZANTAC) 150 MG capsule   0  . zolpidem (AMBIEN) 5 MG tablet take 1 and 1/2 tablet by mouth at bedtime (INSURANCE PAYS FOR 30 EVERY 30 DAYS)  0  . progesterone (PROMETRIUM) 100 MG capsule Take 100 mg by mouth.     No current facility-administered medications for this visit.     Allergies as  of 11/03/2016 - Review Complete 11/03/2016  Allergen Reaction Noted  . Sulfa antibiotics Hives 11/02/2015  . Levofloxacin Diarrhea 09/01/2015  . Cephalosporins Itching and Rash 11/02/2015    ROS:  General: Negative for anorexia, weight loss, fever, chills, fatigue, weakness. ENT: Negative for hoarseness, difficulty swallowing , nasal congestion. CV: Negative for chest pain, angina, palpitations, dyspnea on exertion, peripheral edema.  Respiratory: Negative for dyspnea at rest, dyspnea on exertion, cough, sputum, wheezing.  GI: See history of present illness. GU:  Negative for dysuria, hematuria, urinary incontinence, urinary frequency, nocturnal urination.  Endo: Negative for unusual weight change.    Physical Examination:   BP 124/74 (BP Location: Left Arm, Patient Position: Sitting, Cuff Size: Large)   Pulse 67   Temp 98.3 F (36.8 C) (Oral)   Ht 5\' 4"  (1.626 m)   Wt 197 lb (89.4 kg)   BMI 33.81 kg/m   General: Well-nourished, well-developed in no acute distress.  Eyes: No icterus. Conjunctivae pink. Mouth: Oropharyngeal mucosa moist and pink , no lesions erythema or exudate. Lungs: Clear to auscultation bilaterally. Non-labored. Heart: Regular rate and rhythm, no murmurs rubs or gallops.  Abdomen: Bowel sounds are normal, nontender, nondistended, no hepatosplenomegaly or masses, no abdominal bruits or  hernia , no rebound or guarding.   Extremities: No lower extremity edema. No clubbing or deformities. Neuro: Alert and oriented x 3.  Grossly intact. Skin: Warm and dry, no jaundice.   Psych: Alert and cooperative, normal mood and affect.  Imaging Studies: No results found.  Assessment and Plan:   Brandi Terrell is a 51 y.o. y/o female  here to follow up for GERD. Doing well on PPI , I am trying to wean her off PPI if possible due to concerns for long term use.she is doing very well on Priolosec 20 mg in the am and zantac 150 mg at night , using wedge pillow   Plan     1. Stop prilosec and try zantac 150 mg BID for two weeks, if symptoms return then go back to prilosec 20 mg in am and zantac 150 mg at night  2. Lose weight by next visit aim 5 lbs 3. Stop night time snack before bedtime    Dr Jonathon Bellows  MD Follow up in 6 months

## 2017-01-18 DIAGNOSIS — Z01818 Encounter for other preprocedural examination: Secondary | ICD-10-CM | POA: Diagnosis not present

## 2017-01-26 ENCOUNTER — Encounter: Payer: Self-pay | Admitting: Family Medicine

## 2017-01-26 ENCOUNTER — Ambulatory Visit (INDEPENDENT_AMBULATORY_CARE_PROVIDER_SITE_OTHER): Payer: BLUE CROSS/BLUE SHIELD | Admitting: Family Medicine

## 2017-01-26 DIAGNOSIS — F5104 Psychophysiologic insomnia: Secondary | ICD-10-CM | POA: Diagnosis not present

## 2017-01-26 DIAGNOSIS — F331 Major depressive disorder, recurrent, moderate: Secondary | ICD-10-CM

## 2017-01-26 DIAGNOSIS — Z800 Family history of malignant neoplasm of digestive organs: Secondary | ICD-10-CM | POA: Diagnosis not present

## 2017-01-26 DIAGNOSIS — Z85828 Personal history of other malignant neoplasm of skin: Secondary | ICD-10-CM

## 2017-01-26 DIAGNOSIS — R7303 Prediabetes: Secondary | ICD-10-CM

## 2017-01-26 DIAGNOSIS — R102 Pelvic and perineal pain: Secondary | ICD-10-CM | POA: Diagnosis not present

## 2017-01-26 DIAGNOSIS — I10 Essential (primary) hypertension: Secondary | ICD-10-CM | POA: Insufficient documentation

## 2017-01-26 DIAGNOSIS — F411 Generalized anxiety disorder: Secondary | ICD-10-CM

## 2017-01-26 DIAGNOSIS — K219 Gastro-esophageal reflux disease without esophagitis: Secondary | ICD-10-CM

## 2017-01-26 DIAGNOSIS — E538 Deficiency of other specified B group vitamins: Secondary | ICD-10-CM

## 2017-01-26 DIAGNOSIS — G8929 Other chronic pain: Secondary | ICD-10-CM | POA: Diagnosis not present

## 2017-01-26 DIAGNOSIS — G4733 Obstructive sleep apnea (adult) (pediatric): Secondary | ICD-10-CM | POA: Diagnosis not present

## 2017-01-26 LAB — POC URINALSYSI DIPSTICK (AUTOMATED)
BILIRUBIN UA: NEGATIVE
Blood, UA: NEGATIVE
GLUCOSE UA: NEGATIVE
Ketones, UA: NEGATIVE
Leukocytes, UA: NEGATIVE
Nitrite, UA: NEGATIVE
PH UA: 6 (ref 5.0–8.0)
Protein, UA: NEGATIVE
Spec Grav, UA: >=1.03 — AB (ref 1.010–1.025)
Urobilinogen, UA: 0.2 E.U./dL

## 2017-01-26 NOTE — Assessment & Plan Note (Signed)
Stable control, followed by psychiatry.

## 2017-01-26 NOTE — Assessment & Plan Note (Signed)
ON lamictal, stable control.

## 2017-01-26 NOTE — Assessment & Plan Note (Signed)
Follow over time. 

## 2017-01-26 NOTE — Progress Notes (Signed)
Subjective:    Patient ID: Brandi Terrell, female    DOB: 03-18-1966, 51 y.o.   MRN: [id number]  HPI    51 year old female presents to establish care.  Previous PCP: Dr. Dorthula Perfect  Last OV 10/2016 reviewed in detail.  GYN:  Dr. Tim Lair GI: Dr. Vicente Males  Last CPX:  In last year, pap  When she is having menses she has significant cramping, throbbing in genitals, at urethra and rectum, debilitation at times.. Last weeks at a time while menses on. Ongoing for YEARS, worsening.  Has  urinary urgency, urinary fullness most in AM and evening... Some stress incontinence. Has to rush to bathroom for BM at times to avoid.  No urinary.   No C/D, no blood in stool.  She has upcoming hysteroscopy in August and D and C for pain with menses, DUB. Plans IUD.  [name] was unrevealing.  colonoscopy in past nml x 2.. 2015  Hx of basal cell Ca: followed by Derm.   GERD:stable on omeprazole. Recent check of mg and b12.  Allergic rhinitis: Stable control on zyrtec.   Mild persistent reactive airways and chronic cough: Followed by Dr. Mortimer Fries.   Improved away from mold, now in new house.   OSA: has not treated   Hypertension:    Well controlled on exforge and metoprolol.   Using medication without problems or lightheadedness:  none Chest pain with exertion: none Edema none Short of breath: stable Average home BPs: 120/70s Other issues:  Prediabetes:   Last 5.8 10/2016   Anxiety, depression, insomnia:  Followed by psychiatry.Well controlled on lamictal and Ambien for sleep.   Social History /Family History/Past Medical History reviewed in detail and updated in EMR if needed. Blood pressure 100/70, pulse 76, temperature 98.7 F (37.1 C), temperature source Oral, height 5' 3.5" (1.613 m), weight 193 lb 8 oz (87.8 kg).  Review of Systems  Constitutional: Negative for fatigue and fever.  HENT: Negative for ear pain.   Eyes: Negative for pain.  Respiratory: Negative for chest tightness and shortness of  breath.   Cardiovascular: Negative for chest pain, palpitations and leg swelling.  Gastrointestinal: Positive for abdominal pain and rectal pain. Negative for abdominal distention, anal bleeding, blood in stool, constipation, diarrhea, nausea and vomiting.  Genitourinary: Negative for dysuria.       Objective:   Physical Exam  Constitutional: Vital signs are normal. She appears well-developed and well-nourished. She is cooperative.  Non-toxic appearance. She does not appear ill. No distress.  HENT:  Head: Normocephalic.  Right Ear: Hearing, tympanic membrane, external ear and ear canal normal.  Left Ear: Hearing, tympanic membrane, external ear and ear canal normal.  Nose: Nose normal.  Eyes: Pupils are equal, round, and reactive to light. Conjunctivae, EOM and lids are normal. Lids are everted and swept, no foreign bodies found.  Neck: Trachea normal and normal range of motion. Neck supple. Carotid bruit is not present. No thyroid mass and no thyromegaly present.  Cardiovascular: Normal rate, regular rhythm, S1 normal, S2 normal, normal heart sounds and intact distal pulses.  Exam reveals no gallop.   No murmur heard. Pulmonary/Chest: Effort normal and breath sounds normal. No respiratory distress. She has no wheezes. She has no rhonchi. She has no rales.  Abdominal: Soft. Normal appearance and bowel sounds are normal. She exhibits no distension, no fluid wave, no abdominal bruit and no mass. There is no hepatosplenomegaly. There is no tenderness. There is no rebound, no guarding and no CVA  tenderness. No hernia.  Lymphadenopathy:    She has no cervical adenopathy.    She has no axillary adenopathy.  Neurological: She is alert. She has normal strength. No cranial nerve deficit or sensory deficit.  Skin: Skin is warm, dry and intact. No rash noted.  Psychiatric: Her speech is normal and behavior is normal. Judgment normal. Her mood appears not anxious. Cognition and memory are normal. She  does not exhibit a depressed mood.          Assessment & Plan:

## 2017-01-26 NOTE — Assessment & Plan Note (Signed)
Followed by derm. Dr. Monia Pouch.

## 2017-01-26 NOTE — Assessment & Plan Note (Signed)
Has upcoming hysteroscopy and D and C. Follow up for possible further eval after this has been done to determine if anything else needs to be.  Will check UA today.

## 2017-01-26 NOTE — Assessment & Plan Note (Signed)
Stable control on omeprazole daily. Trigger avoidance.

## 2017-01-26 NOTE — Assessment & Plan Note (Signed)
Now on B12 1000 mcg daily.

## 2017-01-26 NOTE — Addendum Note (Signed)
Addended by: Carter Kitten on: 01/26/2017 09:46 AM   Modules accepted: Orders

## 2017-01-26 NOTE — Assessment & Plan Note (Signed)
Stable control on Ambien nightly.

## 2017-01-26 NOTE — Assessment & Plan Note (Signed)
Not treated... Could not tolerate CPAP.

## 2017-01-26 NOTE — Assessment & Plan Note (Signed)
Well controlled. Continue current medication.  

## 2017-02-03 DIAGNOSIS — H40003 Preglaucoma, unspecified, bilateral: Secondary | ICD-10-CM | POA: Diagnosis not present

## 2017-02-07 DIAGNOSIS — N924 Excessive bleeding in the premenopausal period: Secondary | ICD-10-CM | POA: Diagnosis not present

## 2017-02-07 DIAGNOSIS — Z30430 Encounter for insertion of intrauterine contraceptive device: Secondary | ICD-10-CM | POA: Diagnosis not present

## 2017-02-07 DIAGNOSIS — N840 Polyp of corpus uteri: Secondary | ICD-10-CM | POA: Diagnosis not present

## 2017-02-15 ENCOUNTER — Encounter: Payer: Self-pay | Admitting: Family Medicine

## 2017-02-22 DIAGNOSIS — Z09 Encounter for follow-up examination after completed treatment for conditions other than malignant neoplasm: Secondary | ICD-10-CM | POA: Diagnosis not present

## 2017-02-22 DIAGNOSIS — R3915 Urgency of urination: Secondary | ICD-10-CM | POA: Diagnosis not present

## 2017-03-03 ENCOUNTER — Ambulatory Visit: Payer: BLUE CROSS/BLUE SHIELD | Admitting: Family Medicine

## 2017-03-07 ENCOUNTER — Ambulatory Visit (INDEPENDENT_AMBULATORY_CARE_PROVIDER_SITE_OTHER): Payer: BLUE CROSS/BLUE SHIELD | Admitting: Family Medicine

## 2017-03-07 ENCOUNTER — Encounter: Payer: Self-pay | Admitting: Family Medicine

## 2017-03-07 VITALS — BP 120/80 | HR 82 | Temp 98.7°F | Ht 63.5 in | Wt 194.5 lb

## 2017-03-07 DIAGNOSIS — Z1159 Encounter for screening for other viral diseases: Secondary | ICD-10-CM

## 2017-03-07 DIAGNOSIS — R102 Pelvic and perineal pain: Secondary | ICD-10-CM

## 2017-03-07 DIAGNOSIS — Z13220 Encounter for screening for lipoid disorders: Secondary | ICD-10-CM | POA: Diagnosis not present

## 2017-03-07 DIAGNOSIS — G8929 Other chronic pain: Secondary | ICD-10-CM

## 2017-03-07 LAB — LIPID PANEL
CHOL/HDL RATIO: 4
Cholesterol: 213 mg/dL — ABNORMAL HIGH (ref 0–200)
HDL: 54.1 mg/dL (ref >39.00)
LDL Cholesterol: 127 mg/dL — ABNORMAL HIGH (ref 0–99)
NONHDL: 158.93
Triglycerides: 161 mg/dL — ABNORMAL HIGH (ref 0.0–149.0)
VLDL: 32.2 mg/dL (ref 0.0–40.0)

## 2017-03-07 NOTE — Patient Instructions (Addendum)
Please stop at the lab to have labs drawn. Please stop at the front desk to set up referral.  

## 2017-03-07 NOTE — Assessment & Plan Note (Signed)
Still present after IUD placement and control of perimenopausal bleeding.  No sign of infection in urine. Hysteroscopy.. Removal of polyps has not alleviated pressure.  Most likely bladder issue.Marland Kitchen Possible interstitial cystitis.Marland Kitchen Refer to uro for further evaluation.

## 2017-03-07 NOTE — Progress Notes (Signed)
Subjective:    Patient ID: Brandi Terrell, female    DOB: [birth year], 51 y.o.   MRN: [id number]  HPI   51 year old female presents for follow up chronic perineal pain.  At last OV in 01/2017 to establish care she reported:  When she is having menses she has significant cramping, throbbing in genitals, at urethra and rectum, debilitation at times.. Last weeks at a time while menses on. Ongoing for YEARS, worsening.  Has  urinary urgency, urinary fullness most in AM and evening... Some stress incontinence. Has to rush to bathroom for BM at times to avoid.  No urinary.   No C/D, no blood in stool.  She has upcoming hysteroscopy in August and D and C for pain with menses, DUB. Plans IUD.  Interim history:   On 02/07/2017 she had hysteroscopy for removal of endometrial polyp and IUD for perimenopausal bleeding placement. Post Op exam appt with GYN reviewed.  She states today she has had no further bleeding> She is no longer having the menstrual cramps. She does continue to have low abdominal pressure and pulsing sensation of genitals. No painful, but discomfort. Present when awake.. Able to sleep.  Uses ibuprofen. Helps some. Still having urinary urgency, occ incontinence if she does not get there in time. Also occ stress incontinence. No dysuria. No blood in urine.  Feels like she emptied complete but then has to return multiples times, to empty bladder.  Urination does not relive lower abdominal pressure.   Last 3 UA have been negative.  2017 US pelvic: two small irregular foci 1.1-1.3 cm D within  posterior and fundal myometrium, myometrium diffusely inhomogeneous, tiny  anechoic areas within fundus. Normal left ovary, small vascular calcifications  Right ovary not visualized   Dad with interstitial cystitis.  Review of Systems  Constitutional: Negative for fatigue and fever.  HENT: Negative for congestion.   Eyes: Negative for pain.  Respiratory: Negative  for cough and shortness of breath.   Cardiovascular: Negative for chest pain, palpitations and leg swelling.  Gastrointestinal: Negative for abdominal pain, constipation and diarrhea.  Genitourinary: Negative for dysuria and vaginal bleeding.  Musculoskeletal: Negative for back pain.  Neurological: Negative for syncope, light-headedness and headaches.  Psychiatric/Behavioral: Negative for dysphoric mood.       Objective:   Physical Exam  Constitutional: Vital signs are normal. She appears well-developed and well-nourished. She is cooperative.  Non-toxic appearance. She does not appear ill. No distress.  HENT:  Head: Normocephalic.  Right Ear: Hearing, tympanic membrane, external ear and ear canal normal. Tympanic membrane is not erythematous, not retracted and not bulging.  Left Ear: Hearing, tympanic membrane, external ear and ear canal normal. Tympanic membrane is not erythematous, not retracted and not bulging.  Nose: No mucosal edema or rhinorrhea. Right sinus exhibits no maxillary sinus tenderness and no frontal sinus tenderness. Left sinus exhibits no maxillary sinus tenderness and no frontal sinus tenderness.  Mouth/Throat: Uvula is midline, oropharynx is clear and moist and mucous membranes are normal.  Eyes: Pupils are equal, round, and reactive to light. Conjunctivae, EOM and lids are normal. Lids are everted and swept, no foreign bodies found.  Neck: Trachea normal and normal range of motion. Neck supple. Carotid bruit is not present. No thyroid mass and no thyromegaly present.  Cardiovascular: Normal rate, regular rhythm, S1 normal, S2 normal, normal heart sounds, intact distal pulses and normal pulses.  Exam reveals no gallop and no friction rub.   No murmur heard. Pulmonary/Chest:  Effort normal and breath sounds normal. No tachypnea. No respiratory distress. She has no decreased breath sounds. She has no wheezes. She has no rhonchi. She has no rales.  Abdominal: Soft. Normal  appearance and bowel sounds are normal. There is no hepatosplenomegaly. There is tenderness in the suprapubic area. There is no CVA tenderness. No hernia. Hernia confirmed negative in the ventral area.  Neurological: She is alert.  Skin: Skin is warm, dry and intact. No rash noted.  Psychiatric: Her speech is normal and behavior is normal. Judgment and thought content normal. Her mood appears not anxious. Cognition and memory are normal. She does not exhibit a depressed mood.          Assessment & Plan:

## 2017-03-08 ENCOUNTER — Encounter: Payer: Self-pay | Admitting: *Deleted

## 2017-03-08 LAB — HEPATITIS C ANTIBODY
HEP C AB: NONREACTIVE
SIGNAL TO CUT-OFF: 0.01 (ref <1.00)

## 2017-03-14 ENCOUNTER — Encounter: Payer: Self-pay | Admitting: Family Medicine

## 2017-03-14 DIAGNOSIS — Z1231 Encounter for screening mammogram for malignant neoplasm of breast: Secondary | ICD-10-CM | POA: Diagnosis not present

## 2017-03-20 ENCOUNTER — Encounter: Payer: Self-pay | Admitting: Family Medicine

## 2017-03-21 MED ORDER — IBUPROFEN 800 MG PO TABS: 800.0000 mg | ORAL_TABLET | Freq: Every day | ORAL | 0 refills | Status: DC | PRN

## 2017-03-27 ENCOUNTER — Encounter: Payer: Self-pay | Admitting: Urology

## 2017-03-27 ENCOUNTER — Ambulatory Visit (INDEPENDENT_AMBULATORY_CARE_PROVIDER_SITE_OTHER): Payer: BLUE CROSS/BLUE SHIELD | Admitting: Urology

## 2017-03-27 VITALS — BP 141/88 | HR 79 | Ht 63.5 in | Wt 195.2 lb

## 2017-03-27 DIAGNOSIS — R1030 Lower abdominal pain, unspecified: Secondary | ICD-10-CM | POA: Diagnosis not present

## 2017-03-27 DIAGNOSIS — N3020 Other chronic cystitis without hematuria: Secondary | ICD-10-CM | POA: Diagnosis not present

## 2017-03-27 LAB — URINALYSIS, COMPLETE
Bilirubin, UA: NEGATIVE
Glucose, UA: NEGATIVE
LEUKOCYTES UA: NEGATIVE
Nitrite, UA: NEGATIVE
PH UA: 5.5 (ref 5.0–7.5)
Specific Gravity, UA: >1.03 — ABNORMAL HIGH (ref 1.005–1.030)
Urobilinogen, Ur: 0.2 mg/dL (ref 0.2–1.0)

## 2017-03-27 LAB — MICROSCOPIC EXAMINATION
RBC, UA: NONE SEEN /hpf (ref 0–?)
WBC, UA: NONE SEEN /hpf (ref 0–?)

## 2017-03-27 NOTE — Progress Notes (Signed)
03/27/2017 10:19 AM   Brandi Terrell March 15, 1966 [id number]  Referring provider: Jinny Sanders, MD [place], [place] [address]  No chief complaint on file.   HPI: I was consulted to assess the patient's pelvic discomfort. For 2 years she feels an aching or pulselike feeling near the vagina and anus. She has some vague suprapubic symptoms that are less so. The sensations are daily. The sensations may be relieved minimally when she voids. She does not report vaginal dryness  She had a full gynecological workup in the recent D&C. She was having cramping and bleeding. The question was whether or not she could've interstitial cystitis  She voids every 2 or 3 hours. She has rare incontinence and used to wear a pad. She gets up at least twice to urinate. She voids and feels empty. She then can double void a small amount after washing her hands.  Bowel movements are normal  Modifying factors: There are no other modifying factors  Associated signs and symptoms: There are no other associated signs and symptoms Aggravating and relieving factors: There are no other aggravating or relieving factors Severity: Moderate Duration: Persistent     PMH: Past Medical History:  Diagnosis Date  . Acid reflux   . Basal cell carcinoma (BCC) of face   . Depression   . Hypertension   . Inflammatory polyps of colon ([place])   . Sleep apnea     Surgical History: Past Surgical History:  Procedure Laterality Date  . APPENDECTOMY  1976  . CHOLECYSTECTOMY  2005    Home Medications:  Allergies as of 03/27/2017      Reactions   Sulfa Antibiotics Hives   Levofloxacin Diarrhea   Tramadol Hives   Cephalosporins Itching, Rash      Medication List       Accurate as of 03/27/17 10:19 AM. Always use your most recent med list.          amLODipine-valsartan 10-160 MG tablet Commonly known as:  EXFORGE Take by mouth.   B-12 PO Take by mouth.   cetirizine 10 MG  tablet Commonly known as:  ZYRTEC Take 1 tablet (10 mg total) by mouth at bedtime.   estradiol 0.1 MG/24HR patch Commonly known as:  VIVELLE-DOT PLACE 1 PATCH ONTO THE SKIN TWICE A WEEK.   ibuprofen 800 MG tablet Commonly known as:  ADVIL,MOTRIN Take 1 tablet (800 mg total) by mouth daily as needed.   lamoTRIgine 100 MG tablet Commonly known as:  LAMICTAL Take 100 mg by mouth.   levonorgestrel 20 MCG/24HR IUD Commonly known as:  MIRENA 1 each by Intrauterine route once.   metoprolol succinate 100 MG 24 hr tablet Commonly known as:  TOPROL-XL Take 100 mg by mouth.   MULTI-VITAMINS Tabs Take by mouth.   omeprazole 20 MG capsule Commonly known as:  PRILOSEC Take 20 mg by mouth daily.   zolpidem 5 MG tablet Commonly known as:  AMBIEN take 1 and 1/2 tablet by mouth at bedtime (INSURANCE PAYS FOR 30 EVERY 30 DAYS)       Allergies:  Allergies  Allergen Reactions  . Sulfa Antibiotics Hives  . Levofloxacin Diarrhea  . Tramadol Hives  . Cephalosporins Itching and Rash    Family History: Family History  Problem Relation Age of Onset  . Hypertension Mother   . Depression Mother   . Skin cancer Father   . Hypertension Father   . Depression Father   . Cancer Father 78  colon  . Stroke Maternal Grandmother   . Stroke Paternal Grandmother   . Heart disease Paternal Grandmother   . Hypertension Paternal Grandmother   . Cancer Maternal Grandfather        leukemia  . Stroke Maternal Grandfather   . Stroke Paternal Grandfather     Social History:  reports that she has never smoked. She has never used smokeless tobacco. She reports that she drinks alcohol. She reports that she does not use drugs.  ROS: UROLOGY Frequent Urination?: Yes Hard to postpone urination?: Yes Burning/pain with urination?: No Get up at night to urinate?: Yes Leakage of urine?: Yes Urine stream starts and stops?: No Trouble starting stream?: No Do you have to strain to urinate?:  No Blood in urine?: No Urinary tract infection?: No Sexually transmitted disease?: No Injury to kidneys or bladder?: No Painful intercourse?: No Weak stream?: No Currently pregnant?: No Vaginal bleeding?: No Last menstrual period?: n  Gastrointestinal Nausea?: No Vomiting?: No Indigestion/heartburn?: No Diarrhea?: No Constipation?: No  Constitutional Fever: No Night sweats?: No Weight loss?: No Fatigue?: No  Skin Skin rash/lesions?: No Itching?: No  Eyes Blurred vision?: No Double vision?: No  Ears/Nose/Throat Sore throat?: No Sinus problems?: No  Hematologic/Lymphatic Swollen glands?: No Easy bruising?: No  Cardiovascular Leg swelling?: No Chest pain?: No  Respiratory Cough?: No Shortness of breath?: No  Endocrine Excessive thirst?: No  Musculoskeletal Back pain?: No Joint pain?: No  Neurological Headaches?: No Dizziness?: No  Psychologic Depression?: No Anxiety?: No  Physical Exam: BP (!) 141/88 (BP Location: Right Arm, Patient Position: Sitting, Cuff Size: Normal)   Pulse 79   Ht 5' 3.5" (1.613 m)   Wt 88.5 kg (195 lb 3.2 oz)   BMI 34.04 kg/m   Constitutional:  Alert and oriented, No acute distress. HEENT: [place] AT, moist mucus membranes.  Trachea midline, no masses. Cardiovascular: No clubbing, cyanosis, or edema. Respiratory: Normal respiratory effort, no increased work of breathing. GI: Abdomen is soft, nontender, nondistended, no abdominal masses GU: No CVA tenderness. On pelvic examination the patient did not have atrophy or prolapse. She had no stress incontinence. She did not have bladder or urethral or levator tenderness Skin: No rashes, bruises or suspicious lesions. Lymph: No cervical or inguinal adenopathy. Neurologic: Grossly intact, no focal deficits, moving all 4 extremities. Psychiatric: Normal mood and affect.  Laboratory Data:  Urinalysis    Component Value Date/Time   BILIRUBINUR negative 01/26/2017 0952    PROTEINUR negative 01/26/2017 0952   UROBILINOGEN 0.2 01/26/2017 0952   NITRITE negative 01/26/2017 0952   LEUKOCYTESUR Negative 01/26/2017 5621    Pertinent Imaging: nne  Assessment & Plan:  The patient has vague vaginal and rectal and perineal sensation and discomfort. She has vague abdominal complaints. She has minimal voiding dysfunction with mild frequency and nocturia and her continence level is good  My index of suspicion is low that the patient has interstitial cystitis but it is not unreasonable to undergo a hydrodistention. The pros and cons and risks with my usual template was discussed. If the test was negative and it did not improve her symptoms I suspect she does not have the condition.  The patient will speak to her husband and call me if she would like to proceed  1. Lower abdominal pain 2. Chronic cystitis - Urinalysis, Complete   No Follow-up on file.  Reece Packer, MD  [facility] [address], [place], [place] [address] [phone]

## 2017-03-28 ENCOUNTER — Encounter: Payer: Self-pay | Admitting: Family Medicine

## 2017-03-28 ENCOUNTER — Encounter: Payer: Self-pay | Admitting: Gastroenterology

## 2017-04-03 ENCOUNTER — Telehealth: Payer: Self-pay | Admitting: Urology

## 2017-04-03 NOTE — Telephone Encounter (Signed)
Dr. Matilde Sprang,   Patient called the office today with questions about her next steps.

## 2017-04-05 ENCOUNTER — Other Ambulatory Visit: Payer: Self-pay | Admitting: Internal Medicine

## 2017-04-05 DIAGNOSIS — J45909 Unspecified asthma, uncomplicated: Secondary | ICD-10-CM

## 2017-04-10 ENCOUNTER — Ambulatory Visit: Payer: BLUE CROSS/BLUE SHIELD | Admitting: Gastroenterology

## 2017-04-12 ENCOUNTER — Ambulatory Visit: Payer: 59 | Admitting: Gastroenterology

## 2017-04-13 ENCOUNTER — Ambulatory Visit: Payer: BLUE CROSS/BLUE SHIELD | Admitting: Gastroenterology

## 2017-05-08 ENCOUNTER — Other Ambulatory Visit: Payer: Self-pay | Admitting: Family Medicine

## 2017-05-30 DIAGNOSIS — K6289 Other specified diseases of anus and rectum: Secondary | ICD-10-CM | POA: Diagnosis not present

## 2017-05-30 DIAGNOSIS — R102 Pelvic and perineal pain: Secondary | ICD-10-CM | POA: Diagnosis not present

## 2017-06-07 DIAGNOSIS — Z23 Encounter for immunization: Secondary | ICD-10-CM | POA: Diagnosis not present

## 2017-06-23 ENCOUNTER — Ambulatory Visit: Payer: BLUE CROSS/BLUE SHIELD | Admitting: Family Medicine

## 2017-06-23 ENCOUNTER — Encounter: Payer: Self-pay | Admitting: *Deleted

## 2017-06-23 ENCOUNTER — Other Ambulatory Visit: Payer: Self-pay

## 2017-06-23 ENCOUNTER — Encounter: Payer: Self-pay | Admitting: Family Medicine

## 2017-06-23 VITALS — BP 128/90 | HR 84 | Temp 98.9°F | Ht 63.5 in | Wt 195.8 lb

## 2017-06-23 DIAGNOSIS — R7303 Prediabetes: Secondary | ICD-10-CM | POA: Diagnosis not present

## 2017-06-23 DIAGNOSIS — G8929 Other chronic pain: Secondary | ICD-10-CM | POA: Diagnosis not present

## 2017-06-23 DIAGNOSIS — R102 Pelvic and perineal pain: Secondary | ICD-10-CM

## 2017-06-23 DIAGNOSIS — I10 Essential (primary) hypertension: Secondary | ICD-10-CM

## 2017-06-23 DIAGNOSIS — E538 Deficiency of other specified B group vitamins: Secondary | ICD-10-CM | POA: Diagnosis not present

## 2017-06-23 DIAGNOSIS — R300 Dysuria: Secondary | ICD-10-CM | POA: Diagnosis not present

## 2017-06-23 LAB — POC URINALSYSI DIPSTICK (AUTOMATED)
Bilirubin, UA: NEGATIVE
GLUCOSE UA: NEGATIVE
KETONES UA: NEGATIVE
Leukocytes, UA: NEGATIVE
Nitrite, UA: NEGATIVE
Protein, UA: NEGATIVE
RBC UA: NEGATIVE
Spec Grav, UA: >=1.03 — AB (ref 1.010–1.025)
UROBILINOGEN UA: 0.2 U/dL
pH, UA: 6 (ref 5.0–8.0)

## 2017-06-23 LAB — COMPREHENSIVE METABOLIC PANEL
ALK PHOS: 90 U/L (ref 39–117)
ALT: 19 U/L (ref 0–35)
AST: 18 U/L (ref 0–37)
Albumin: 4.4 g/dL (ref 3.5–5.2)
BILIRUBIN TOTAL: 0.4 mg/dL (ref 0.2–1.2)
BUN: 19 mg/dL (ref 6–23)
CALCIUM: 10.1 mg/dL (ref 8.4–10.5)
CO2: 28 mEq/L (ref 19–32)
Chloride: 103 mEq/L (ref 96–112)
Creatinine, Ser: 0.64 mg/dL (ref 0.40–1.20)
GFR: 102.85 mL/min (ref >60.00)
GLUCOSE: 96 mg/dL (ref 70–99)
Potassium: 3.9 mEq/L (ref 3.5–5.1)
Sodium: 138 mEq/L (ref 135–145)
Total Protein: 7.2 g/dL (ref 6.0–8.3)

## 2017-06-23 LAB — HEMOGLOBIN A1C: HEMOGLOBIN A1C: 6.1 % (ref 4.6–6.5)

## 2017-06-23 LAB — VITAMIN B12: Vitamin B-12: 578 pg/mL (ref 211–911)

## 2017-06-23 MED ORDER — METOPROLOL SUCCINATE ER 100 MG PO TB24: 100.0000 mg | ORAL_TABLET | Freq: Every day | ORAL | 1 refills | Status: DC

## 2017-06-23 NOTE — Addendum Note (Signed)
Addended by: Carter Kitten on: 06/23/2017 09:15 AM   Modules accepted: Orders

## 2017-06-23 NOTE — Patient Instructions (Addendum)
Please stop at the lab to have labs drawn. Keep appointment with pelvic floor specialist.

## 2017-06-23 NOTE — Addendum Note (Signed)
Addended by: Carter Kitten on: 06/23/2017 09:21 AM   Modules accepted: Orders

## 2017-06-23 NOTE — Assessment & Plan Note (Signed)
Due for re-eval. 

## 2017-06-23 NOTE — Assessment & Plan Note (Signed)
Continue current medication.  Borderline high today.. But well controlled at recent MD visits.  Recheck BPO in 3 months.

## 2017-06-23 NOTE — Progress Notes (Signed)
Subjective:    Patient ID: Brandi Terrell, female    DOB: 04-17-1966, 52 y.o.   MRN: [id number]  HPI   2 year ol d female presents for medication refill for her HTN  Hypertension:   Good control on amlodipine, valsartan , metoprolol  05/31/2017 143/79 at GYN. BP Readings from Last 3 Encounters:  06/23/17 128/90  03/27/17 (!) 141/88  03/07/17 120/80  Using medication without problems or lightheadedness:  none Chest pain with exertion:none Edema: intermittently Short of breath:none Average home BPs: Not checking at home.. At other MDs Other issues:   Diet: moderate  exercise: starting back walking. Wt Readings from Last 3 Encounters:  06/23/17 195 lb 12 oz (88.8 kg)  03/27/17 195 lb 3.2 oz (88.5 kg)  03/07/17 194 lb 8 oz (88.2 kg)  SHE WISHES TO BE SEEN EVERY^ MONTHS.   She is due for B12 re-eval. Started on  B12 1000 mcg in spring.   She has noted off and on burning with urination, in last few months, none in last week. Also noted bubbles in urine  off and on 4-6 weeks.. None in last 2-3 weeks.   She has chronic perineal pain: seeing GYN. Had IUD placed.. Bleeding has resolved. Pain improved, still with cramping urethra to rectum.  Gyn recommended pelvic pain MD at [facility].   Saw urologist .. Shedid not feel visit was helpful... She does not wish to return. Social History /Family History/Past Medical History reviewed in detail and updated in EMR if needed. Blood pressure 128/90, pulse 84, temperature 98.9 F (37.2 C), temperature source Oral, height 5' 3.5" (1.613 m), weight 195 lb 12 oz (88.8 kg).  Review of Systems  Constitutional: Negative for fatigue and fever.  HENT: Negative for ear pain.   Eyes: Negative for pain.  Respiratory: Negative for chest tightness and shortness of breath.   Cardiovascular: Negative for chest pain, palpitations and leg swelling.  Gastrointestinal: Negative for abdominal pain.  Genitourinary: Positive for dysuria.       Objective:   Physical Exam  Constitutional: Vital signs are normal. She appears well-developed and well-nourished. She is cooperative.  Non-toxic appearance. She does not appear ill. No distress.  HENT:  Head: Normocephalic.  Right Ear: Hearing, tympanic membrane, external ear and ear canal normal. Tympanic membrane is not erythematous, not retracted and not bulging.  Left Ear: Hearing, tympanic membrane, external ear and ear canal normal. Tympanic membrane is not erythematous, not retracted and not bulging.  Nose: No mucosal edema or rhinorrhea. Right sinus exhibits no maxillary sinus tenderness and no frontal sinus tenderness. Left sinus exhibits no maxillary sinus tenderness and no frontal sinus tenderness.  Mouth/Throat: Uvula is midline, oropharynx is clear and moist and mucous membranes are normal.  Eyes: Conjunctivae, EOM and lids are normal. Pupils are equal, round, and reactive to light. Lids are everted and swept, no foreign bodies found.  Neck: Trachea normal and normal range of motion. Neck supple. Carotid bruit is not present. No thyroid mass and no thyromegaly present.  Cardiovascular: Normal rate, regular rhythm, S1 normal, S2 normal, normal heart sounds, intact distal pulses and normal pulses. Exam reveals no gallop and no friction rub.  No murmur heard. Pulmonary/Chest: Effort normal and breath sounds normal. No tachypnea. No respiratory distress. She has no decreased breath sounds. She has no wheezes. She has no rhonchi. She has no rales.  Abdominal: Soft. Normal appearance and bowel sounds are normal. There is no tenderness.  Neurological: She is alert.  Skin: Skin is warm, dry and intact. No rash noted.  Psychiatric: Her speech is normal and behavior is normal. Judgment and thought content normal. Her mood appears not anxious. Cognition and memory are normal. She does not exhibit a depressed mood.          Assessment & Plan:

## 2017-06-23 NOTE — Assessment & Plan Note (Signed)
Some better with IUD.  Referred to pelvic pain specialist.  Eval urine today given intermittent symptoms of dysuria and bubbles.

## 2017-07-12 DIAGNOSIS — R278 Other lack of coordination: Secondary | ICD-10-CM | POA: Diagnosis not present

## 2017-07-15 IMAGING — CT CT CHEST W/O CM
1 series · 16 of 34 positions shown, 20 images · non-contrast
Comparison: CT abdomen 08/12/2009

CLINICAL DATA: Persistent cough and SOB since approximately
[DATE]. NKI No hx CA. No hx thoracic surg. Never a smoker.

EXAM:
CT CHEST WITHOUT CONTRAST
TECHNIQUE: Multidetector CT imaging of the chest was performed following the
standard protocol without IV contrast.

[Series 2: thorax · axial · 0.69mm/px · z∈[-676,-426]mm · 16 of 142 slices shown, 20 images]
[im 11/142  mediastinal]
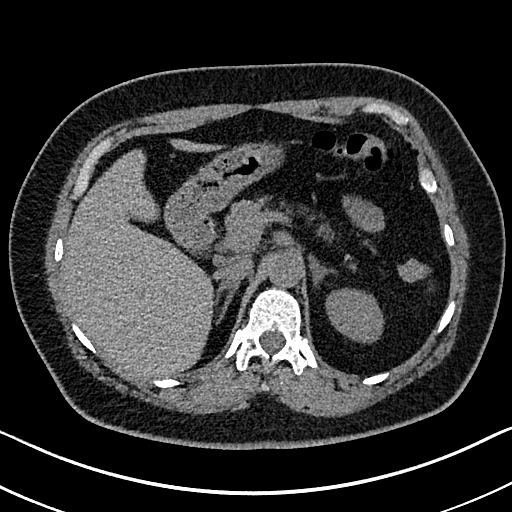
[im 11/142  lung]
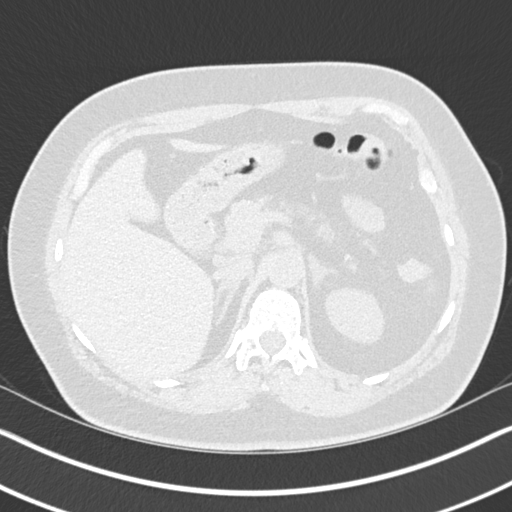
[im 21/142  lung]
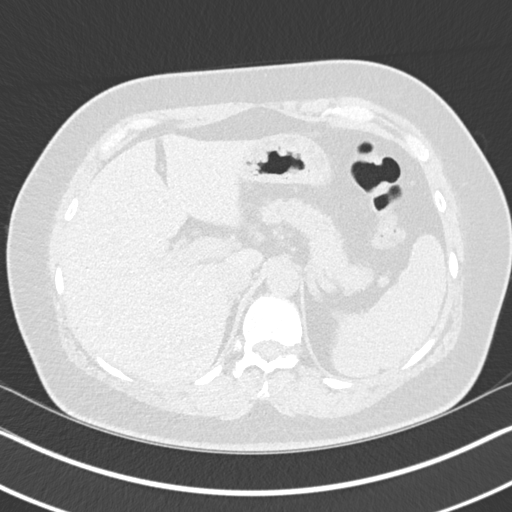
[im 29/142  lung]
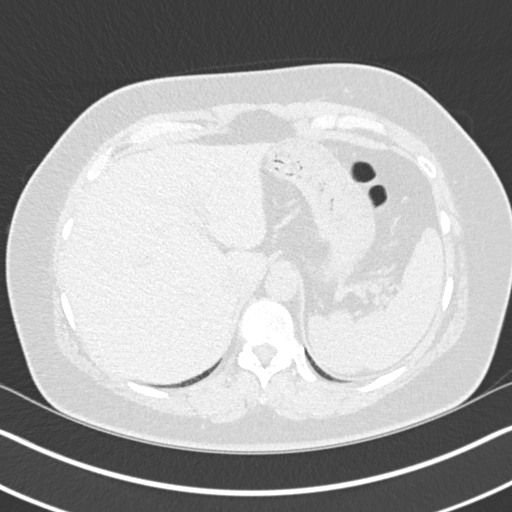
[im 37/142  lung]
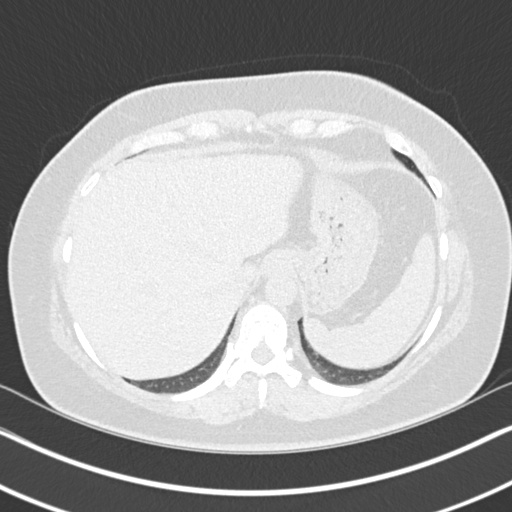
[im 48/142  mediastinal]
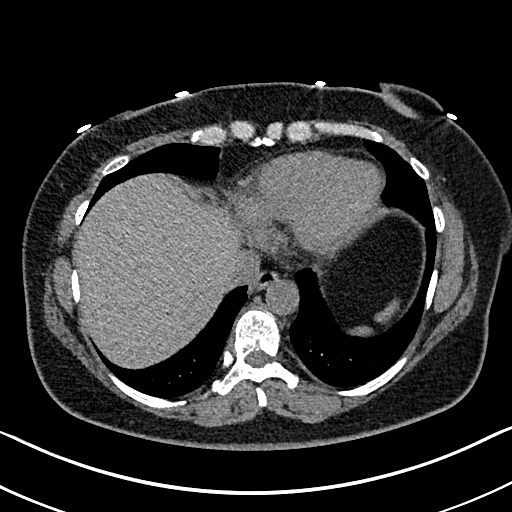
[im 48/142  lung]
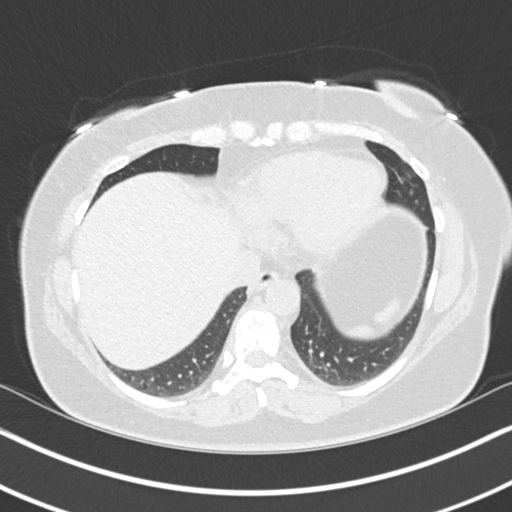
[im 57/142  lung]
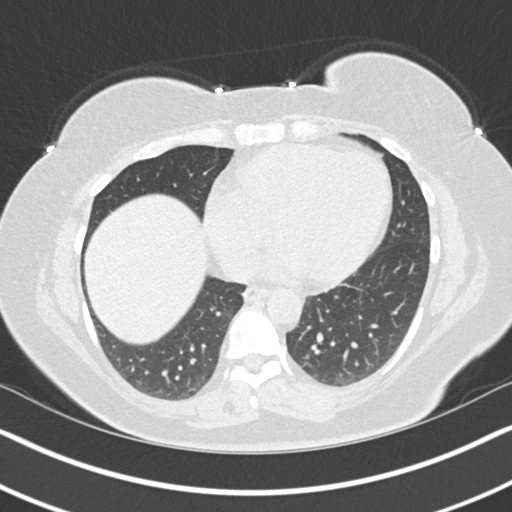
[im 63/142  lung]
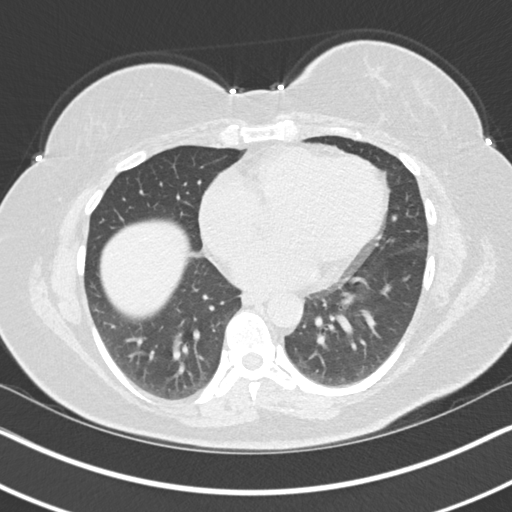
[im 68/142  lung]
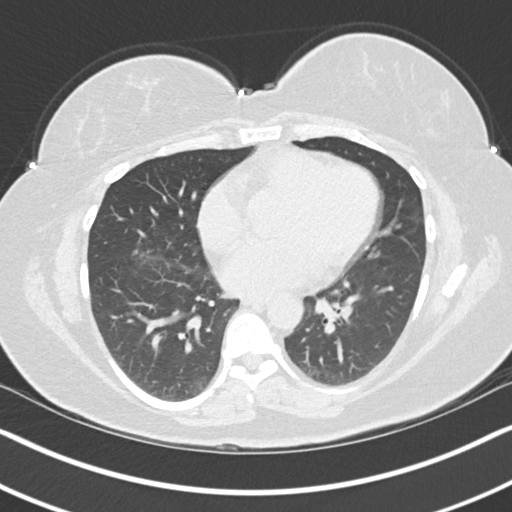
[im 76/142  mediastinal]
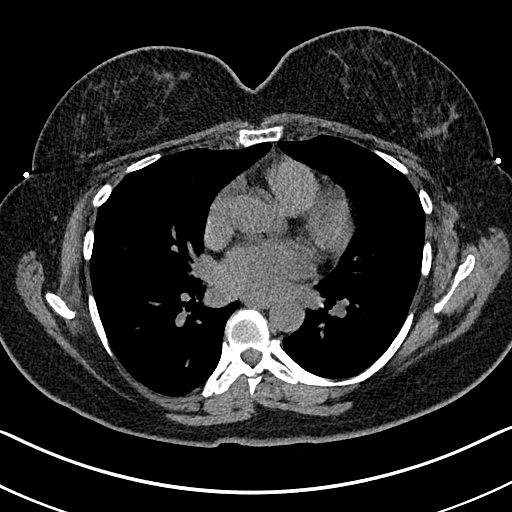
[im 76/142  lung]
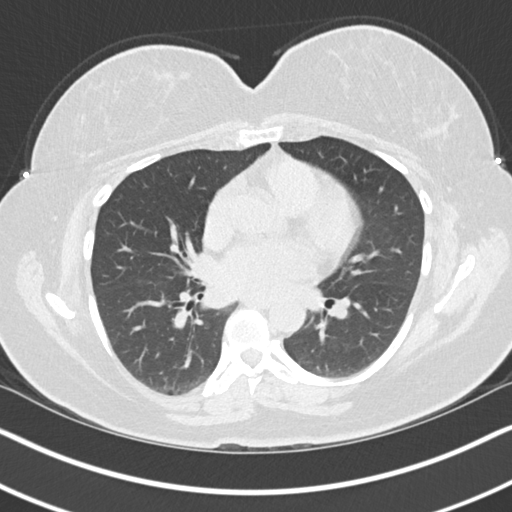
[im 84/142  lung]
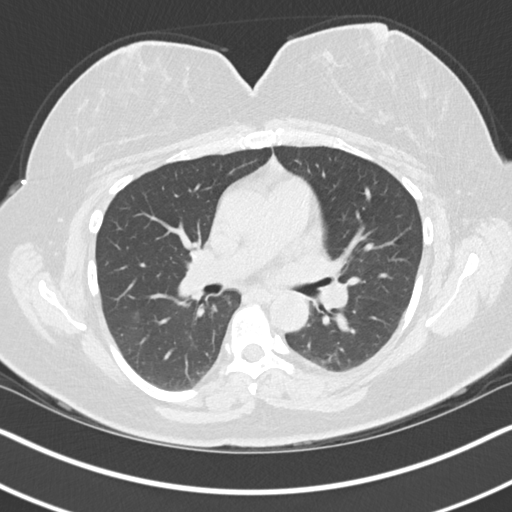
[im 89/142  lung]
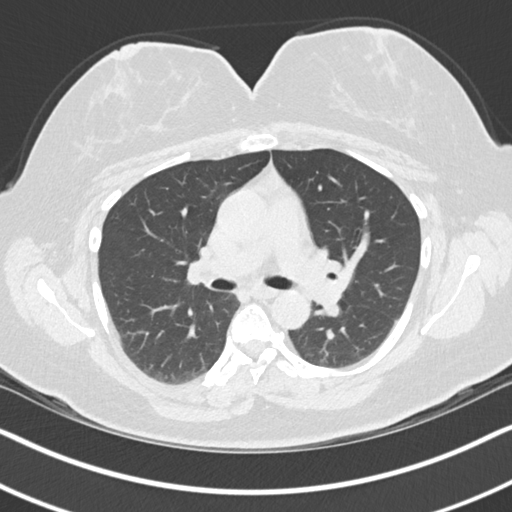
[im 100/142  lung]
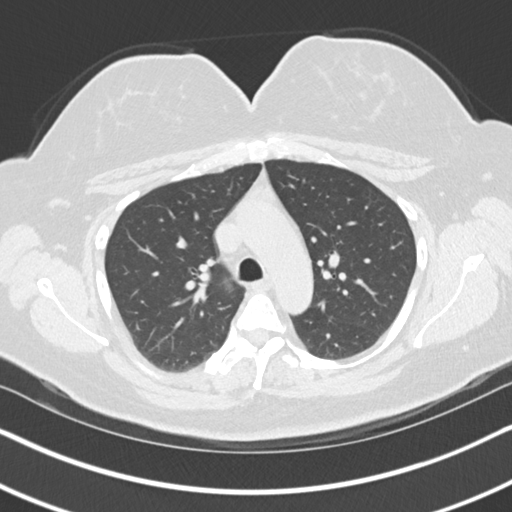
[im 110/142  mediastinal]
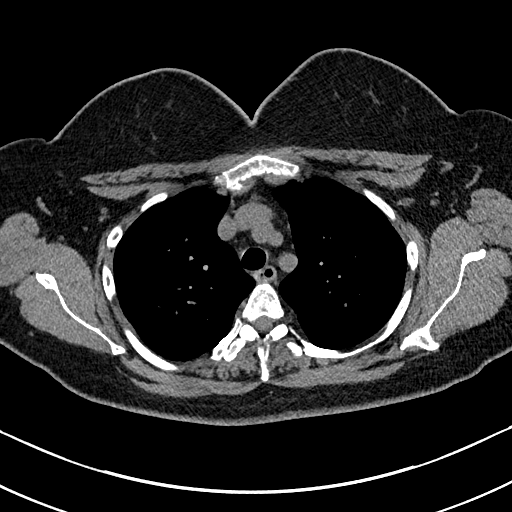
[im 110/142  lung]
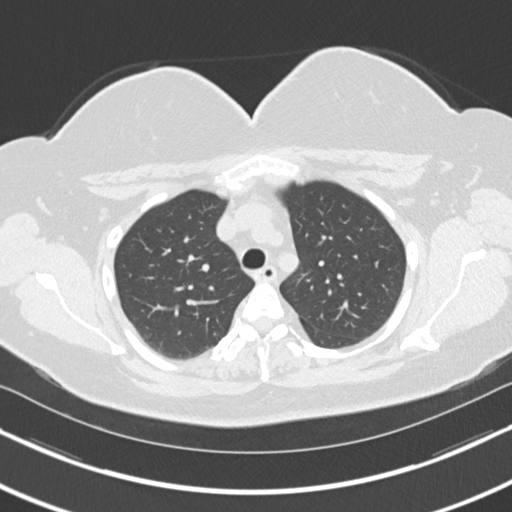
[im 115/142  lung]
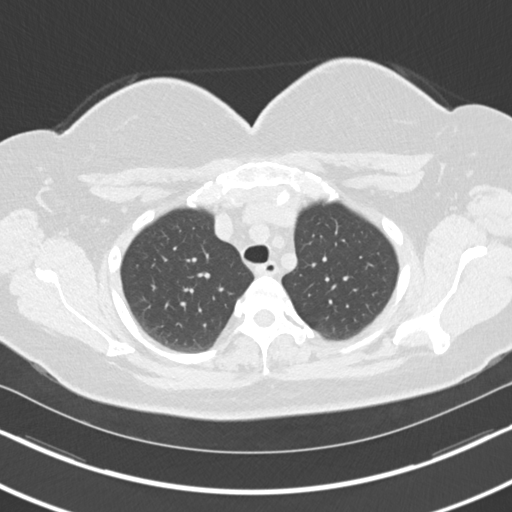
[im 126/142  lung]
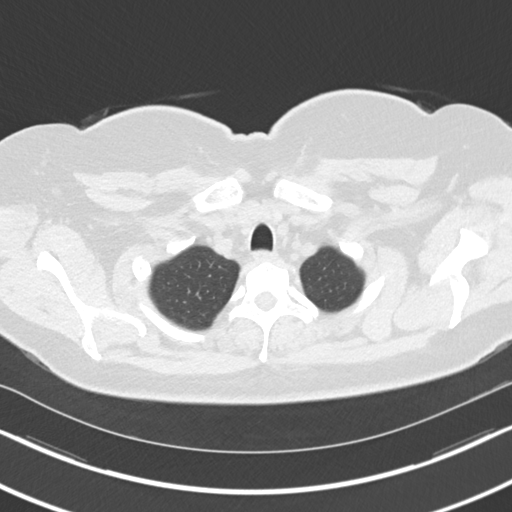
[im 136/142  lung]
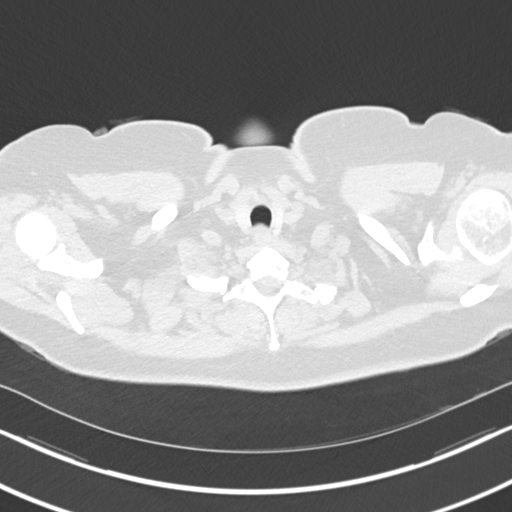

[16 of 34 positions shown; findings below may reference images not displayed]

FINDINGS: Cardiovascular: No acute findings of the aorta or great vessels on
noncontrast exam. No pericardial fluid.

Mediastinum/Nodes: No axillary supraclavicular adenopathy. No
mediastinal hilar adenopathy.

Lungs/Pleura: No pulmonary nodularity. No airspace opacity. No
pleural thickening. Airways normal.

Upper Abdomen: No focal hepatic lesion. Postcholecystectomy. No
biliary dilatation. Adrenal glands normal

Musculoskeletal: No aggressive osseous lesion.  Scoliosis.
IMPRESSION: 1. No acute pulmonary parenchymal findings.
2. No adenopathy.
3. Scoliosis.

## 2017-07-24 DIAGNOSIS — R278 Other lack of coordination: Secondary | ICD-10-CM | POA: Diagnosis not present

## 2017-07-24 DIAGNOSIS — N3946 Mixed incontinence: Secondary | ICD-10-CM | POA: Diagnosis not present

## 2017-07-26 DIAGNOSIS — D225 Melanocytic nevi of trunk: Secondary | ICD-10-CM | POA: Diagnosis not present

## 2017-07-26 DIAGNOSIS — D2261 Melanocytic nevi of right upper limb, including shoulder: Secondary | ICD-10-CM | POA: Diagnosis not present

## 2017-07-26 DIAGNOSIS — Z08 Encounter for follow-up examination after completed treatment for malignant neoplasm: Secondary | ICD-10-CM | POA: Diagnosis not present

## 2017-07-26 DIAGNOSIS — Z85828 Personal history of other malignant neoplasm of skin: Secondary | ICD-10-CM | POA: Diagnosis not present

## 2017-08-02 DIAGNOSIS — H40003 Preglaucoma, unspecified, bilateral: Secondary | ICD-10-CM | POA: Diagnosis not present

## 2017-08-07 DIAGNOSIS — H40053 Ocular hypertension, bilateral: Secondary | ICD-10-CM | POA: Diagnosis not present

## 2017-08-16 DIAGNOSIS — N3946 Mixed incontinence: Secondary | ICD-10-CM | POA: Diagnosis not present

## 2017-08-16 DIAGNOSIS — R278 Other lack of coordination: Secondary | ICD-10-CM | POA: Diagnosis not present

## 2017-09-06 DIAGNOSIS — F3341 Major depressive disorder, recurrent, in partial remission: Secondary | ICD-10-CM | POA: Diagnosis not present

## 2017-09-06 DIAGNOSIS — R278 Other lack of coordination: Secondary | ICD-10-CM | POA: Diagnosis not present

## 2017-09-06 DIAGNOSIS — N3946 Mixed incontinence: Secondary | ICD-10-CM | POA: Diagnosis not present

## 2017-09-29 LAB — HM PAP SMEAR

## 2017-10-02 ENCOUNTER — Encounter: Payer: Self-pay | Admitting: *Deleted

## 2017-10-03 ENCOUNTER — Ambulatory Visit (INDEPENDENT_AMBULATORY_CARE_PROVIDER_SITE_OTHER): Payer: BLUE CROSS/BLUE SHIELD | Admitting: Family Medicine

## 2017-10-03 DIAGNOSIS — I10 Essential (primary) hypertension: Secondary | ICD-10-CM

## 2017-10-09 DIAGNOSIS — R3911 Hesitancy of micturition: Secondary | ICD-10-CM | POA: Diagnosis not present

## 2017-10-09 DIAGNOSIS — M6289 Other specified disorders of muscle: Secondary | ICD-10-CM | POA: Diagnosis not present

## 2017-10-09 DIAGNOSIS — Z01419 Encounter for gynecological examination (general) (routine) without abnormal findings: Secondary | ICD-10-CM | POA: Diagnosis not present

## 2017-10-09 DIAGNOSIS — N951 Menopausal and female climacteric states: Secondary | ICD-10-CM | POA: Diagnosis not present

## 2017-10-09 DIAGNOSIS — Z1231 Encounter for screening mammogram for malignant neoplasm of breast: Secondary | ICD-10-CM | POA: Diagnosis not present

## 2017-10-17 ENCOUNTER — Ambulatory Visit: Payer: BLUE CROSS/BLUE SHIELD | Admitting: Family Medicine

## 2017-10-17 ENCOUNTER — Encounter: Payer: Self-pay | Admitting: Family Medicine

## 2017-10-17 DIAGNOSIS — G8929 Other chronic pain: Secondary | ICD-10-CM

## 2017-10-17 DIAGNOSIS — R102 Pelvic and perineal pain: Secondary | ICD-10-CM | POA: Diagnosis not present

## 2017-10-17 DIAGNOSIS — R7303 Prediabetes: Secondary | ICD-10-CM | POA: Diagnosis not present

## 2017-10-17 DIAGNOSIS — I10 Essential (primary) hypertension: Secondary | ICD-10-CM | POA: Diagnosis not present

## 2017-10-17 MED ORDER — IBUPROFEN 800 MG PO TABS: 800.0000 mg | ORAL_TABLET | Freq: Three times a day (TID) | ORAL | 1 refills | Status: DC | PRN

## 2017-10-17 NOTE — Assessment & Plan Note (Addendum)
PT has been helpful.  Has appt with UROGYN in next 2 week.  Will use ibuprofen 800 mg as needed.

## 2017-10-17 NOTE — Assessment & Plan Note (Signed)
Well controlled. Continue current medication.  

## 2017-10-17 NOTE — Patient Instructions (Signed)
Work on low [employer], increase exercise.

## 2017-10-17 NOTE — Progress Notes (Signed)
   Subjective:    Patient ID: Brandi Terrell, female    DOB: 1965/09/06, 52 y.o.   MRN: [id number]  HPI  52 year old female presents for 3 month follow up .  Hypertension:   Good control today   BP Readings from Last 3 Encounters:  10/17/17 124/86  06/23/17 128/90  03/27/17 (!) 141/88  Using medication without problems or lightheadedness:  none Chest pain with exertion: none Edema:  off and on Short of breath: none Average home BPs: not checking BP at home Other issues:  A1C last OV in 06/2017  In prediabetes range  Diet: moderate, working on low carb diet  Exercise: intermittant   Pelvic floor pain:  She has been going to PT at [facility] 4-5 sessions..  Pelvic floor chronically contracted Given coping strategies.  Has noted some improvement.  Last week saw new GYN.. Had a very painful pelvic exam... Has appt at [facility] with Dr. Erick Colace a uro/gyn.  Blood pressure 124/86, pulse 78, temperature 98.8 F (37.1 C), temperature source Oral, height 5' 3.5" (1.613 m), weight 193 lb (87.5 kg), SpO2 97 %.  Review of Systems  Constitutional: Negative for fatigue and fever.  HENT: Negative for ear pain.   Eyes: Negative for pain.  Respiratory: Negative for chest tightness and shortness of breath.   Cardiovascular: Negative for chest pain, palpitations and leg swelling.  Gastrointestinal: Negative for abdominal pain.  Genitourinary: Negative for dysuria.       Objective:   Physical Exam  Constitutional: Vital signs are normal. She appears well-developed and well-nourished. She is cooperative.  Non-toxic appearance. She does not appear ill. No distress.  HENT:  Head: Normocephalic.  Right Ear: Hearing, tympanic membrane, external ear and ear canal normal. Tympanic membrane is not erythematous, not retracted and not bulging.  Left Ear: Hearing, tympanic membrane, external ear and ear canal normal. Tympanic membrane is not erythematous, not retracted and not bulging.  Nose: No mucosal  edema or rhinorrhea. Right sinus exhibits no maxillary sinus tenderness and no frontal sinus tenderness. Left sinus exhibits no maxillary sinus tenderness and no frontal sinus tenderness.  Mouth/Throat: Uvula is midline, oropharynx is clear and moist and mucous membranes are normal.  Eyes: Pupils are equal, round, and reactive to light. Conjunctivae, EOM and lids are normal. Lids are everted and swept, no foreign bodies found.  Neck: Trachea normal and normal range of motion. Neck supple. Carotid bruit is not present. No thyroid mass and no thyromegaly present.  Cardiovascular: Normal rate, regular rhythm, S1 normal, S2 normal, normal heart sounds, intact distal pulses and normal pulses. Exam reveals no gallop and no friction rub.  No murmur heard. Pulmonary/Chest: Effort normal and breath sounds normal. No tachypnea. No respiratory distress. She has no decreased breath sounds. She has no wheezes. She has no rhonchi. She has no rales.  Abdominal: Soft. Normal appearance and bowel sounds are normal. There is no tenderness.  Neurological: She is alert.  Skin: Skin is warm, dry and intact. No rash noted.  Psychiatric: Her speech is normal and behavior is normal. Judgment and thought content normal. Her mood appears not anxious. Cognition and memory are normal. She does not exhibit a depressed mood.          Assessment & Plan:

## 2017-10-17 NOTE — Assessment & Plan Note (Signed)
Work on low [employer], increase exercise.

## 2017-11-01 DIAGNOSIS — C44311 Basal cell carcinoma of skin of nose: Secondary | ICD-10-CM | POA: Diagnosis not present

## 2017-11-01 DIAGNOSIS — R102 Pelvic and perineal pain: Secondary | ICD-10-CM | POA: Diagnosis not present

## 2017-11-01 DIAGNOSIS — D485 Neoplasm of uncertain behavior of skin: Secondary | ICD-10-CM | POA: Diagnosis not present

## 2017-11-01 DIAGNOSIS — L821 Other seborrheic keratosis: Secondary | ICD-10-CM | POA: Diagnosis not present

## 2017-11-06 DIAGNOSIS — N951 Menopausal and female climacteric states: Secondary | ICD-10-CM | POA: Diagnosis not present

## 2017-11-06 DIAGNOSIS — R102 Pelvic and perineal pain: Secondary | ICD-10-CM | POA: Diagnosis not present

## 2017-11-06 DIAGNOSIS — M7918 Myalgia, other site: Secondary | ICD-10-CM | POA: Diagnosis not present

## 2017-11-08 DIAGNOSIS — M6281 Muscle weakness (generalized): Secondary | ICD-10-CM | POA: Diagnosis not present

## 2017-11-08 DIAGNOSIS — N3941 Urge incontinence: Secondary | ICD-10-CM | POA: Diagnosis not present

## 2017-11-08 DIAGNOSIS — M25651 Stiffness of right hip, not elsewhere classified: Secondary | ICD-10-CM | POA: Diagnosis not present

## 2017-11-08 DIAGNOSIS — N3946 Mixed incontinence: Secondary | ICD-10-CM | POA: Diagnosis not present

## 2017-11-08 DIAGNOSIS — R278 Other lack of coordination: Secondary | ICD-10-CM | POA: Diagnosis not present

## 2017-11-08 DIAGNOSIS — N393 Stress incontinence (female) (male): Secondary | ICD-10-CM | POA: Diagnosis not present

## 2017-11-08 DIAGNOSIS — M62838 Other muscle spasm: Secondary | ICD-10-CM | POA: Diagnosis not present

## 2017-11-14 ENCOUNTER — Telehealth: Payer: Self-pay | Admitting: Family Medicine

## 2017-11-14 DIAGNOSIS — M25651 Stiffness of right hip, not elsewhere classified: Secondary | ICD-10-CM | POA: Diagnosis not present

## 2017-11-14 NOTE — Telephone Encounter (Signed)
Copied from [place] [id number]. Topic: Quick Communication - Rx Refill/Question >> Nov 14, 2017  4:46 PM [name] B, NT wrote: Medication: amLODipine-valsartan (EXFORGE) 10-160 MG tablet   Has the patient contacted their pharmacy? Yes.   (Agent: If no, request that the patient contact the pharmacy for the refill.) (Agent: If yes, when and what did the pharmacy advise?)  Preferred Pharmacy (with phone number or street name): [facility] [place], [place]: Please be advised that RX refills may take up to 3 business days. We ask that you follow-up with your pharmacy.

## 2017-11-15 MED ORDER — AMLODIPINE BESYLATE-VALSARTAN 10-160 MG PO TABS
ORAL_TABLET | ORAL | 1 refills | Status: DC
Start: 2017-11-15 — End: 2017-12-31

## 2017-11-16 NOTE — Progress Notes (Signed)
Cancelled.  

## 2017-11-20 DIAGNOSIS — M7918 Myalgia, other site: Secondary | ICD-10-CM | POA: Diagnosis not present

## 2017-11-20 DIAGNOSIS — N951 Menopausal and female climacteric states: Secondary | ICD-10-CM | POA: Diagnosis not present

## 2017-11-20 DIAGNOSIS — R102 Pelvic and perineal pain: Secondary | ICD-10-CM | POA: Diagnosis not present

## 2017-11-20 DIAGNOSIS — N946 Dysmenorrhea, unspecified: Secondary | ICD-10-CM | POA: Diagnosis not present

## 2017-11-30 ENCOUNTER — Encounter: Payer: Self-pay | Admitting: Internal Medicine

## 2017-11-30 ENCOUNTER — Ambulatory Visit: Payer: BLUE CROSS/BLUE SHIELD | Admitting: Internal Medicine

## 2017-11-30 VITALS — BP 132/80 | HR 75 | Ht 63.5 in | Wt 195.0 lb

## 2017-11-30 DIAGNOSIS — J383 Other diseases of vocal cords: Secondary | ICD-10-CM

## 2017-11-30 NOTE — Progress Notes (Signed)
[facility] Consultation      Date: 11/30/2017,   MRN# [id number] Brandi Terrell 1965/07/19    AdmissionWeight: 195 lb (88.5 kg)                 CurrentWeight: 195 lb (88.5 kg) Brandi Terrell is a 52 y.o. old female seen in consultation for cough at the request of [facility]  Synopsis: patient seen for chronic SOB and DOE with voice changes and vocal cord weakness with recurrent bouts of URI's Overall, her resp status is stable and most of her symptoms are from her vocal cord disease Patient is noncompliant with nocturnal oxygen and noncompliant with CPAP   CHIEF COMPLAINT:  Follow up Vocal cord dysfunction   HISTORY OF PRESENT ILLNESS    Patient dx with chronic VCD by ENT at [facility] resolved with time and moving to new home Patient has NL PFT's Inhalers do NOT help  ONO+ uses oxygen at night, has OSA but noncompliant with oxygen and CPAP ECHO shows grad 1 diastolic dysfunction  PFT 11/25/15 Ratio 82% FEV1 89% FVC 85% DLCO 110% RV 38% TLC 67%  6MWT WNL  No associated leg swelling. No sign so infection at this time  She is non-smoker, however has been exposed to second hand smoke for 20 years Patient also has GERD takes PPI, also on ARB for HTN No signs of CHF   No signs of  She is nonsmoker, howevere Current Medication:  Current Outpatient Medications:  .  amLODipine-valsartan (EXFORGE) 10-160 MG tablet, take 1 tablet by mouth once daily for blood pressure, Disp: 90 tablet, Rfl: 1 .  cetirizine (ZYRTEC) 5 MG tablet, Take 5 mg by mouth daily., Disp: , Rfl:  .  Cyanocobalamin (B-12 PO), Take by mouth., Disp: , Rfl:  .  estradiol (VIVELLE-DOT) 0.1 MG/24HR patch, PLACE 1 PATCH ONTO THE SKIN TWICE A WEEK., Disp: , Rfl: 11 .  ibuprofen (IBU) 800 MG tablet, Take 1 tablet (800 mg total) by mouth every 8 (eight) hours as needed., Disp: 30 tablet, Rfl: 1 .  lamoTRIgine (LAMICTAL) 100 MG tablet, Take 100 mg by mouth., Disp: , Rfl:  .  levonorgestrel  (MIRENA) 20 MCG/24HR IUD, 1 each by Intrauterine route once., Disp: , Rfl:  .  metoprolol succinate (TOPROL-XL) 100 MG 24 hr tablet, Take 1 tablet (100 mg total) by mouth daily., Disp: 90 tablet, Rfl: 1 .  Multiple Vitamin (MULTI-VITAMINS) TABS, Take by mouth., Disp: , Rfl:  .  omeprazole (PRILOSEC) 20 MG capsule, Take 20 mg by mouth daily., Disp: , Rfl:  .  zolpidem (AMBIEN) 5 MG tablet, take 1 and 1/2 tablet by mouth at bedtime (INSURANCE PAYS FOR 30 EVERY 30 DAYS), Disp: , Rfl: 0    ALLERGIES   Sulfa antibiotics; Levofloxacin; Tramadol; and Cephalosporins     REVIEW OF SYSTEMS   Review of Systems  Constitutional: Negative for chills, diaphoresis, fever, malaise/fatigue and weight loss.  HENT: Negative for congestion.   Eyes: Negative for blurred vision and double vision.  Respiratory: Negative for cough, hemoptysis, sputum production, shortness of breath and wheezing.   Cardiovascular: Negative for chest pain, palpitations, orthopnea and leg swelling.  Gastrointestinal: Negative for heartburn, nausea and vomiting.  Genitourinary: Negative for frequency and urgency.  Skin: Negative for rash.  All other systems reviewed and are negative.   BP 132/80 (BP Location: Left Arm, Cuff Size: Normal)   Pulse 75   Ht 5' 3.5" (1.613 m)   Wt 195 lb (  88.5 kg)   SpO2 96%   BMI 34.00 kg/m     PHYSICAL EXAM  Physical Exam  Constitutional: She is oriented to person, place, and time. No distress.  HENT:  Mouth/Throat: No oropharyngeal exudate.  Cardiovascular: Normal rate, regular rhythm and normal heart sounds.  No murmur heard. Pulmonary/Chest: Effort normal and breath sounds normal. No stridor. No respiratory distress. She has no wheezes. She has no rales.  Musculoskeletal: Normal range of motion. She exhibits no edema.  Neurological: She is alert and oriented to person, place, and time. No cranial nerve deficit.  Skin: Skin is warm. She is not diaphoretic.  Psychiatric: She has a  normal mood and affect.     CT chest 01/21/16 images  reviewed No abnormal findings at this time   ASSESSMENT/PLAN   52 yo white female seen today for follow up chronic cough with signs and symptoms of allergic rhinitis, with  reactive airways disease with recurrent bouts of URI leading to vocal cord dysfunction/weakness, assocaited with noncompliant OSA with chronic resp failure with nocturnal hypoxia  symptoms of VCD resolved, cough and URI symptoms have resolved   Patient is NON compliant with oxygen as well  1.no inhalers needed at this time 2.continue PPI 3.antihisamine daily with Zyrtec- 4.follow up ENT recs-will see vocal cord specialist at [facility] as needed  5.recommend increased excersices 6.recommend weight loss   Follow up as needed   Patient  satisfied with Plan of action and management. All questions answered  Brandi Terrell, M.D.  [facility]  Medical Director [place] Director [facility] Cardio-Pulmonary Department

## 2017-11-30 NOTE — Patient Instructions (Signed)
Follow up [facility] as needed Recommend increased exercise capacity

## 2017-12-11 DIAGNOSIS — M25651 Stiffness of right hip, not elsewhere classified: Secondary | ICD-10-CM | POA: Diagnosis not present

## 2017-12-11 DIAGNOSIS — C44311 Basal cell carcinoma of skin of nose: Secondary | ICD-10-CM | POA: Diagnosis not present

## 2017-12-28 DIAGNOSIS — C44311 Basal cell carcinoma of skin of nose: Secondary | ICD-10-CM | POA: Diagnosis not present

## 2017-12-31 ENCOUNTER — Other Ambulatory Visit: Payer: Self-pay | Admitting: Family Medicine

## 2018-02-21 DIAGNOSIS — Z85828 Personal history of other malignant neoplasm of skin: Secondary | ICD-10-CM | POA: Diagnosis not present

## 2018-02-21 DIAGNOSIS — Z08 Encounter for follow-up examination after completed treatment for malignant neoplasm: Secondary | ICD-10-CM | POA: Diagnosis not present

## 2018-02-21 DIAGNOSIS — D225 Melanocytic nevi of trunk: Secondary | ICD-10-CM | POA: Diagnosis not present

## 2018-02-21 DIAGNOSIS — D2272 Melanocytic nevi of left lower limb, including hip: Secondary | ICD-10-CM | POA: Diagnosis not present

## 2018-02-27 ENCOUNTER — Encounter: Payer: Self-pay | Admitting: *Deleted

## 2018-02-27 DIAGNOSIS — Z23 Encounter for immunization: Secondary | ICD-10-CM | POA: Diagnosis not present

## 2018-03-06 DIAGNOSIS — H40003 Preglaucoma, unspecified, bilateral: Secondary | ICD-10-CM | POA: Diagnosis not present

## 2018-03-15 DIAGNOSIS — Z1231 Encounter for screening mammogram for malignant neoplasm of breast: Secondary | ICD-10-CM | POA: Diagnosis not present

## 2018-03-28 ENCOUNTER — Other Ambulatory Visit: Payer: Self-pay | Admitting: Family Medicine

## 2018-04-03 ENCOUNTER — Other Ambulatory Visit: Payer: Self-pay | Admitting: Family Medicine

## 2018-04-09 ENCOUNTER — Telehealth: Payer: Self-pay | Admitting: Family Medicine

## 2018-04-09 DIAGNOSIS — E538 Deficiency of other specified B group vitamins: Secondary | ICD-10-CM

## 2018-04-09 DIAGNOSIS — I10 Essential (primary) hypertension: Secondary | ICD-10-CM

## 2018-04-09 DIAGNOSIS — R7303 Prediabetes: Secondary | ICD-10-CM

## 2018-04-09 NOTE — Telephone Encounter (Signed)
-----   Message from Ellamae Sia sent at 04/03/2018  9:10 AM EDT ----- Regarding: Lab orders for Tuesday, 10.22.19 Patient is scheduled for CPX labs, please order future labs, Thanks , Karna Christmas

## 2018-04-10 ENCOUNTER — Other Ambulatory Visit (INDEPENDENT_AMBULATORY_CARE_PROVIDER_SITE_OTHER): Payer: BLUE CROSS/BLUE SHIELD

## 2018-04-10 DIAGNOSIS — I10 Essential (primary) hypertension: Secondary | ICD-10-CM | POA: Diagnosis not present

## 2018-04-10 DIAGNOSIS — E538 Deficiency of other specified B group vitamins: Secondary | ICD-10-CM | POA: Diagnosis not present

## 2018-04-10 DIAGNOSIS — R7303 Prediabetes: Secondary | ICD-10-CM | POA: Diagnosis not present

## 2018-04-10 LAB — LIPID PANEL
CHOLESTEROL: 200 mg/dL (ref 0–200)
HDL: 41.4 mg/dL (ref >39.00)
LDL CALC: 123 mg/dL — AB (ref 0–99)
NonHDL: 158.66
TRIGLYCERIDES: 177 mg/dL — AB (ref 0.0–149.0)
Total CHOL/HDL Ratio: 5
VLDL: 35.4 mg/dL (ref 0.0–40.0)

## 2018-04-10 LAB — COMPREHENSIVE METABOLIC PANEL
ALT: 22 U/L (ref 0–35)
AST: 20 U/L (ref 0–37)
Albumin: 4.5 g/dL (ref 3.5–5.2)
Alkaline Phosphatase: 81 U/L (ref 39–117)
BUN: 17 mg/dL (ref 6–23)
CALCIUM: 10.1 mg/dL (ref 8.4–10.5)
CHLORIDE: 103 meq/L (ref 96–112)
CO2: 29 meq/L (ref 19–32)
Creatinine, Ser: 0.71 mg/dL (ref 0.40–1.20)
GFR: 90.97 mL/min (ref >60.00)
Glucose, Bld: 108 mg/dL — ABNORMAL HIGH (ref 70–99)
POTASSIUM: 3.7 meq/L (ref 3.5–5.1)
Sodium: 138 mEq/L (ref 135–145)
Total Bilirubin: 0.7 mg/dL (ref 0.2–1.2)
Total Protein: 7.4 g/dL (ref 6.0–8.3)

## 2018-04-10 LAB — HEMOGLOBIN A1C: Hgb A1c MFr Bld: 5.8 % (ref 4.6–6.5)

## 2018-04-10 LAB — VITAMIN B12: Vitamin B-12: 825 pg/mL (ref 211–911)

## 2018-04-16 ENCOUNTER — Encounter: Payer: Self-pay | Admitting: *Deleted

## 2018-04-17 ENCOUNTER — Encounter: Payer: Self-pay | Admitting: Family Medicine

## 2018-04-17 ENCOUNTER — Ambulatory Visit (INDEPENDENT_AMBULATORY_CARE_PROVIDER_SITE_OTHER): Payer: BLUE CROSS/BLUE SHIELD | Admitting: Family Medicine

## 2018-04-17 VITALS — BP 118/80 | HR 78 | Temp 99.1°F | Ht 63.75 in | Wt 195.8 lb

## 2018-04-17 DIAGNOSIS — C44319 Basal cell carcinoma of skin of other parts of face: Secondary | ICD-10-CM

## 2018-04-17 DIAGNOSIS — I10 Essential (primary) hypertension: Secondary | ICD-10-CM

## 2018-04-17 DIAGNOSIS — F331 Major depressive disorder, recurrent, moderate: Secondary | ICD-10-CM

## 2018-04-17 DIAGNOSIS — R7303 Prediabetes: Secondary | ICD-10-CM

## 2018-04-17 DIAGNOSIS — F411 Generalized anxiety disorder: Secondary | ICD-10-CM

## 2018-04-17 DIAGNOSIS — E538 Deficiency of other specified B group vitamins: Secondary | ICD-10-CM

## 2018-04-17 DIAGNOSIS — G4733 Obstructive sleep apnea (adult) (pediatric): Secondary | ICD-10-CM

## 2018-04-17 DIAGNOSIS — Z0000 Encounter for general adult medical examination without abnormal findings: Secondary | ICD-10-CM | POA: Diagnosis not present

## 2018-04-17 MED ORDER — AMLODIPINE BESYLATE-VALSARTAN 10-160 MG PO TABS: ORAL_TABLET | ORAL | 3 refills | Status: DC

## 2018-04-17 MED ORDER — METOPROLOL SUCCINATE ER 100 MG PO TB24: 100.0000 mg | ORAL_TABLET | Freq: Every day | ORAL | 3 refills | Status: DC

## 2018-04-17 NOTE — Assessment & Plan Note (Signed)
Well controlled. Continue current medication. Encouraged exercise, weight loss, healthy eating habits.  

## 2018-04-17 NOTE — Assessment & Plan Note (Signed)
Well controlled on lamictal and Ambien for sleep.

## 2018-04-17 NOTE — Assessment & Plan Note (Signed)
Resolved on supplement 

## 2018-04-17 NOTE — Assessment & Plan Note (Signed)
Needs re-eval and treatment with CPA.Marland Kitchen Likely causing her some of her fatigue.

## 2018-04-17 NOTE — Assessment & Plan Note (Signed)
Stable control working on weight loss and low carb diet.

## 2018-04-17 NOTE — Progress Notes (Signed)
Subjective:    Patient ID: Brandi Terrell, female    DOB: Jul 30, 1965, 52 y.o.   MRN: [id number]  HPI  The patient is here for annual wellness exam and preventative care.     MDD, GAD  Well controlled on current meds. Followed by Psychiatry, Dr. Scot Dock. Depression screen [facility] 2/9 04/17/2018 01/26/2017  Decreased Interest 0 0  Down, Depressed, Hopeless 0 1  PHQ - 2 Score 0 1   Hypertension:    Well controlled On exforge, toprol XL BP Readings from Last 3 Encounters:  04/17/18 118/80  11/30/17 132/80  10/17/17 124/86  Using medication without problems or lightheadedness:  none Chest pain with exertion:none Edema:none Short of breath: Gets more winded when exerting herself, going up stairs in past few years.  ECHO: EF 60-65% Average home BPs: Other issues: Body mass index is 33.86 kg/m. Wt Readings from Last 3 Encounters:  04/17/18 195 lb 12 oz (88.8 kg)  11/30/17 195 lb (88.5 kg)  10/17/17 193 lb (87.5 kg)    OSA: Not treated... She is very tired during the day. Had issues with mask in past... Could not get comfortable in mask... Associated nasal irrigation and headache.  Husband now on CPAP but mask looks more comfortable.  Last sleep test over 3 years ago.   prediabetes  Lab Results  Component Value Date   HGBA1C 5.8 04/10/2018   LDL at goal < 130, trig slightly high Lab Results  Component Value Date   CHOL 200 04/10/2018   HDL 41.40 04/10/2018   LDLCALC 123 (H) 04/10/2018   TRIG 177.0 (H) 04/10/2018   CHOLHDL 5 04/10/2018     Social History /Family History/Past Medical History reviewed in detail and updated in EMR if needed.  Blood pressure 118/80, pulse 78, temperature 99.1 F (37.3 C), temperature source Oral, height 5' 3.75" (1.619 m), weight 195 lb 12 oz (88.8 kg).  Review of Systems  Constitutional: Negative for fatigue and fever.  HENT: Negative for congestion.   Eyes: Negative for pain.  Respiratory: Negative for cough and shortness of breath.     Cardiovascular: Negative for chest pain, palpitations and leg swelling.  Gastrointestinal: Negative for abdominal pain.  Genitourinary: Negative for dysuria and vaginal bleeding.  Musculoskeletal: Negative for back pain.  Neurological: Negative for syncope, light-headedness and headaches.  Psychiatric/Behavioral: Negative for dysphoric mood.       Objective:   Physical Exam  Constitutional: Vital signs are normal. She appears well-developed and well-nourished. She is cooperative.  Non-toxic appearance. She does not appear ill. No distress.  obese  HENT:  Head: Normocephalic.  Right Ear: Hearing, tympanic membrane, external ear and ear canal normal.  Left Ear: Hearing, tympanic membrane, external ear and ear canal normal.  Nose: Nose normal.  Eyes: Pupils are equal, round, and reactive to light. Conjunctivae, EOM and lids are normal. Lids are everted and swept, no foreign bodies found.  Neck: Trachea normal and normal range of motion. Neck supple. Carotid bruit is not present. No thyroid mass and no thyromegaly present.  Cardiovascular: Normal rate, regular rhythm, S1 normal, S2 normal, normal heart sounds and intact distal pulses. Exam reveals no gallop.  No murmur heard. Pulmonary/Chest: Effort normal and breath sounds normal. No respiratory distress. She has no wheezes. She has no rhonchi. She has no rales.  Abdominal: Soft. Normal appearance and bowel sounds are normal. She exhibits no distension, no fluid wave, no abdominal bruit and no mass. There is no hepatosplenomegaly. There is no tenderness.  There is no rebound, no guarding and no CVA tenderness. No hernia.  Lymphadenopathy:    She has no cervical adenopathy.    She has no axillary adenopathy.  Neurological: She is alert. She has normal strength. No cranial nerve deficit or sensory deficit.  Skin: Skin is warm, dry and intact. No rash noted.  Psychiatric: Her speech is normal and behavior is normal. Judgment normal. Her mood  appears not anxious. Cognition and memory are normal. She does not exhibit a depressed mood.          Assessment & Plan:  The patient's preventative maintenance and recommended screening tests for an annual wellness exam were reviewed in full today. Brought up to date unless services declined.  Counselled on the importance of diet, exercise, and its role in overall health and mortality. The patient's FH and SH was reviewed, including their home life, tobacco status, and drug and alcohol status.   Vaccines: Uptodate with tdap and flu. Pap/DVE:  Per GYN Mammo: 02/2018 Colon: 04/2014 repeat in 5 years, father with colon cancer Smoking Status:nonsmoker  Hep C:  done  HIV screen:   refused

## 2018-04-17 NOTE — Patient Instructions (Addendum)
Try moving toprol Xl back to taking it later in the day.  Work on decreasing fat and chol in diet, increase veggies fats in place of animal fats, increase exercise. Please stop at the front desk to set up referral.

## 2018-04-17 NOTE — Addendum Note (Signed)
Addended by: Carter Kitten on: 04/17/2018 09:26 AM   Modules accepted: Orders

## 2018-05-03 ENCOUNTER — Ambulatory Visit (INDEPENDENT_AMBULATORY_CARE_PROVIDER_SITE_OTHER): Payer: BLUE CROSS/BLUE SHIELD | Admitting: Internal Medicine

## 2018-05-03 ENCOUNTER — Encounter: Payer: Self-pay | Admitting: Internal Medicine

## 2018-05-03 VITALS — BP 132/82 | HR 72 | Ht 64.0 in | Wt 195.4 lb

## 2018-05-03 DIAGNOSIS — G4719 Other hypersomnia: Secondary | ICD-10-CM

## 2018-05-03 NOTE — Patient Instructions (Signed)
Please obtain sleep study 

## 2018-05-03 NOTE — Progress Notes (Signed)
[facility] Consultation      Date: 05/03/2018,   MRN# [id number] Brandi Terrell [birth year]    AdmissionWeight: 195 lb 6.4 oz (88.6 kg)                 CurrentWeight: 195 lb 6.4 oz (88.6 kg) Brandi Terrell is a 52 y.o. old female seen in consultation for cough at the request of [facility]  Synopsis:  patient seen for chronic SOB and DOE with voice changes and vocal cord weakness with recurrent bouts of URI's Overall, her resp status is stable and most of her symptoms are from her vocal cord disease Patient is noncompliant with nocturnal oxygen and noncompliant with CPAP   CHIEF COMPLAINT:  Excessive daytime sleepiness Previous history of sleep apnea   HISTORY OF PRESENT ILLNESS   previously diagnosed with chronic vocal cord dysfunction  Patient diagnosed with chronic vocal cord dysfunction being treated at [facility] with moving continue home  Patient previous use of oxygen at nighttime but noncompliant with oxygen and CPAP therapy She has a history of grade 1 diastolic dysfunction based on echocardiogram  Patient currently reports Epworth sleep score of 14 Excessive daytime sleepiness noted  seen today for problems with sleep Patient  has been having sleep problems for over one year Patient has been having excessive daytime sleepiness Patient has been having extreme fatigue and tiredness, lack of energy +  very Loud snoring every night + struggling breathe at night and gasps for air   Discussed sleep data and reviewed with patient.  Encouraged proper weight management.  Discussed driving precautions and its relationship with hypersomnolence.  Discussed operating dangerous equipment and its relationship with hypersomnolence.  Discussed sleep hygiene, and benefits of a fixed sleep waked time.  The importance of getting eight or more hours of sleep discussed with patient.  Discussed limiting the use of the computer and television before  bedtime.  Decrease naps during the day, so night time sleep will become enhanced.  Limit caffeine, and sleep deprivation.  HTN, stroke, and heart failure are potential risk factors.   No signs of  She is nonsmoker, howevere Current Medication:  Current Outpatient Medications:  .  amLODipine-valsartan (EXFORGE) 10-160 MG tablet, TAKE 1 TABLET BY MOUTH EVERY DAY FOR BLOOD PRESSURE, Disp: 90 tablet, Rfl: 3 .  cetirizine (ZYRTEC) 5 MG tablet, Take 5 mg by mouth daily., Disp: , Rfl:  .  Cyanocobalamin (B-12 PO), Take by mouth., Disp: , Rfl:  .  estradiol (VIVELLE-DOT) 0.1 MG/24HR patch, PLACE 1 PATCH ONTO THE SKIN TWICE A WEEK., Disp: , Rfl: 11 .  ibuprofen (ADVIL,MOTRIN) 800 MG tablet, Take 800 mg by mouth as needed., Disp: , Rfl:  .  lamoTRIgine (LAMICTAL) 100 MG tablet, Take 100 mg by mouth., Disp: , Rfl:  .  levonorgestrel (MIRENA) 20 MCG/24HR IUD, 1 each by Intrauterine route once., Disp: , Rfl:  .  metoprolol succinate (TOPROL-XL) 100 MG 24 hr tablet, Take 1 tablet (100 mg total) by mouth daily. Take with or immediately following a meal., Disp: 90 tablet, Rfl: 3 .  Multiple Vitamin (MULTI-VITAMINS) TABS, Take by mouth., Disp: , Rfl:  .  omeprazole (PRILOSEC) 20 MG capsule, Take 20 mg by mouth daily., Disp: , Rfl:  .  zolpidem (AMBIEN) 5 MG tablet, take 1 and 1/2 tablet by mouth at bedtime (INSURANCE PAYS FOR 30 EVERY 30 DAYS), Disp: , Rfl: 0    ALLERGIES   Sulfa antibiotics; Levofloxacin; Tramadol; and Cephalosporins  REVIEW OF SYSTEMS   Review of Systems  Constitutional: Positive for malaise/fatigue. Negative for chills, diaphoresis, fever and weight loss.  HENT: Negative for congestion.   Eyes: Negative for blurred vision and double vision.  Respiratory: Negative for cough, hemoptysis, sputum production, shortness of breath and wheezing.   Cardiovascular: Negative for chest pain, palpitations, orthopnea and leg swelling.  Gastrointestinal: Negative for heartburn, nausea  and vomiting.  Genitourinary: Negative for frequency and urgency.  Skin: Negative for rash.  All other systems reviewed and are negative.   BP 132/82 (BP Location: Left Arm, Cuff Size: Normal)   Pulse 72   Ht 5\' 4"  (1.626 m)   Wt 195 lb 6.4 oz (88.6 kg)   SpO2 97%   BMI 33.54 kg/m     PHYSICAL EXAM  Physical Exam  Constitutional: She is oriented to person, place, and time. No distress.  HENT:  Mouth/Throat: No oropharyngeal exudate.  Cardiovascular: Normal rate, regular rhythm and normal heart sounds.  No murmur heard. Pulmonary/Chest: Effort normal and breath sounds normal. No stridor. No respiratory distress. She has no wheezes. She has no rales.  Musculoskeletal: Normal range of motion. She exhibits no edema.  Neurological: She is alert and oriented to person, place, and time. No cranial nerve deficit.  Skin: Skin is warm. She is not diaphoretic.  Psychiatric: She has a normal mood and affect.     CT chest 01/21/16 images  reviewed No abnormal findings at this time   ASSESSMENT/PLAN   52 year old pleasant white female seen today for follow-up excessive daytime sleepiness with a previous history of sleep apnea with findings to suggest ongoing issues with sleep apnea with a previous history of vocal cord dysfunction which has now resolved associated with being overweight and deconditioned state  At this time patient will need a repeat sleep study to assess for underlying sleep apnea  Obesity -recommend significant weight loss -recommend changing diet  Deconditioned state -Recommend increased daily activity and exercise  At this time her vocal cord dysfunction has completely resolved No further work-up at this time Previous pulmonary function testing was within normal limits      Patient  satisfied with Plan of action and management. All questions answered Follow up after test completed  Corrin Parker, M.D.  [facility]    Medical Director [facility] Director [facility] Cardio-Pulmonary Department

## 2018-05-04 DIAGNOSIS — L821 Other seborrheic keratosis: Secondary | ICD-10-CM | POA: Diagnosis not present

## 2018-05-04 DIAGNOSIS — D485 Neoplasm of uncertain behavior of skin: Secondary | ICD-10-CM | POA: Diagnosis not present

## 2018-05-04 DIAGNOSIS — L570 Actinic keratosis: Secondary | ICD-10-CM | POA: Diagnosis not present

## 2018-05-10 DIAGNOSIS — G4733 Obstructive sleep apnea (adult) (pediatric): Secondary | ICD-10-CM | POA: Diagnosis not present

## 2018-05-15 DIAGNOSIS — G4733 Obstructive sleep apnea (adult) (pediatric): Secondary | ICD-10-CM | POA: Diagnosis not present

## 2018-05-16 ENCOUNTER — Telehealth: Payer: Self-pay | Admitting: *Deleted

## 2018-05-16 DIAGNOSIS — G4733 Obstructive sleep apnea (adult) (pediatric): Secondary | ICD-10-CM

## 2018-05-16 NOTE — Telephone Encounter (Signed)
Pt aware of results. Orders placed  Nothing further needed. 

## 2018-05-21 DIAGNOSIS — B309 Viral conjunctivitis, unspecified: Secondary | ICD-10-CM | POA: Diagnosis not present

## 2018-05-22 ENCOUNTER — Other Ambulatory Visit: Payer: Self-pay | Admitting: *Deleted

## 2018-05-22 DIAGNOSIS — G4719 Other hypersomnia: Secondary | ICD-10-CM

## 2018-05-25 ENCOUNTER — Other Ambulatory Visit: Payer: Self-pay | Admitting: *Deleted

## 2018-05-25 DIAGNOSIS — G4733 Obstructive sleep apnea (adult) (pediatric): Secondary | ICD-10-CM

## 2018-05-25 DIAGNOSIS — B309 Viral conjunctivitis, unspecified: Secondary | ICD-10-CM | POA: Diagnosis not present

## 2018-05-31 DIAGNOSIS — H1033 Unspecified acute conjunctivitis, bilateral: Secondary | ICD-10-CM | POA: Diagnosis not present

## 2018-06-04 DIAGNOSIS — B300 Keratoconjunctivitis due to adenovirus: Secondary | ICD-10-CM | POA: Diagnosis not present

## 2018-06-22 DIAGNOSIS — B300 Keratoconjunctivitis due to adenovirus: Secondary | ICD-10-CM | POA: Diagnosis not present

## 2018-07-17 ENCOUNTER — Ambulatory Visit: Payer: BLUE CROSS/BLUE SHIELD | Admitting: Family Medicine

## 2018-07-17 ENCOUNTER — Encounter: Payer: Self-pay | Admitting: Family Medicine

## 2018-07-17 VITALS — BP 120/82 | HR 74 | Temp 98.9°F | Ht 63.75 in | Wt 196.8 lb

## 2018-07-17 DIAGNOSIS — H5789 Other specified disorders of eye and adnexa: Secondary | ICD-10-CM

## 2018-07-17 DIAGNOSIS — R5383 Other fatigue: Secondary | ICD-10-CM | POA: Diagnosis not present

## 2018-07-17 LAB — CBC WITH DIFFERENTIAL/PLATELET
BASOS PCT: 0.5 % (ref 0.0–3.0)
Basophils Absolute: 0 10*3/uL (ref 0.0–0.1)
EOS ABS: 0.1 10*3/uL (ref 0.0–0.7)
Eosinophils Relative: 1.3 % (ref 0.0–5.0)
HEMATOCRIT: 39.8 % (ref 36.0–46.0)
HEMOGLOBIN: 13.2 g/dL (ref 12.0–15.0)
LYMPHS PCT: 40.9 % (ref 12.0–46.0)
Lymphs Abs: 3 10*3/uL (ref 0.7–4.0)
MCHC: 33.2 g/dL (ref 30.0–36.0)
MCV: 87.6 fl (ref 78.0–100.0)
MONO ABS: 0.4 10*3/uL (ref 0.1–1.0)
Monocytes Relative: 5.9 % (ref 3.0–12.0)
Neutro Abs: 3.8 10*3/uL (ref 1.4–7.7)
Neutrophils Relative %: 51.4 % (ref 43.0–77.0)
Platelets: 354 10*3/uL (ref 150.0–400.0)
RBC: 4.54 Mil/uL (ref 3.87–5.11)
RDW: 13.8 % (ref 11.5–15.5)
WBC: 7.4 10*3/uL (ref 4.0–10.5)

## 2018-07-17 LAB — TSH: TSH: 2.08 u[IU]/mL (ref 0.35–4.50)

## 2018-07-17 LAB — T4, FREE: Free T4: 0.98 ng/dL (ref 0.60–1.60)

## 2018-07-17 NOTE — Assessment & Plan Note (Signed)
No clear underlying issue. No sign of auto immune or neurologic issue.  Will check cbc and TSH given fatigue.  refer back to eye MD for management

## 2018-07-17 NOTE — Progress Notes (Signed)
   Subjective:    Patient ID: Brandi Terrell, female    DOB: 05/02/1966, 53 y.o.   MRN: [id number]  Conjunctivitis   Pertinent negatives include no fever, no abdominal pain, no congestion, no headaches, no cough and no eye pain.   Right eye 2 months ago.. went to eye MD.. Dx with pink eye given antibitoic drops., few days later eye swollen shut.. given saline drops.  1 week after that both eye red and swollen.. went back to eye MD.  Given prednisone drops BID, lubricating drops.  Improved  Some... given eye antibiotic/steroid (neomycin/polyxinB/Dexamethasone) ointment    Had some scarring on underside of eyelid.. caused some irritation. Continue on once daily antibiotic/steroid  Ointment. Improved almost completely.    In past 3-4 days.. she has noted increase in eye discharge ( clear, whitish), scratchy feeling in eyes ( right vs left), slightly , not severe.   She is under a a ton of stress, fatigue. No cough,  mildcongestion, no face pain, no fever, no SOB. No current blurry vision.  Has follow up Feb 4th with eye MD.   HX glaucoma suspect  Mother with stage 4 lung cancer.  Review of Systems  Constitutional: Negative for fatigue and fever.  HENT: Negative for congestion.   Eyes: Negative for pain.  Respiratory: Negative for cough and shortness of breath.   Cardiovascular: Negative for chest pain, palpitations and leg swelling.  Gastrointestinal: Negative for abdominal pain.  Genitourinary: Negative for dysuria and vaginal bleeding.  Musculoskeletal: Negative for back pain.  Neurological: Negative for syncope, light-headedness and headaches.  Psychiatric/Behavioral: Negative for dysphoric mood.       Objective:   Physical Exam        Assessment & Plan:

## 2018-07-17 NOTE — Patient Instructions (Addendum)
Keep appt with eye doctor and have them send [name] a note.   Please stop at the lab to have labs drawn.

## 2018-07-24 DIAGNOSIS — H40053 Ocular hypertension, bilateral: Secondary | ICD-10-CM | POA: Diagnosis not present

## 2018-07-25 DIAGNOSIS — D225 Melanocytic nevi of trunk: Secondary | ICD-10-CM | POA: Diagnosis not present

## 2018-07-25 DIAGNOSIS — D2261 Melanocytic nevi of right upper limb, including shoulder: Secondary | ICD-10-CM | POA: Diagnosis not present

## 2018-07-25 DIAGNOSIS — Z08 Encounter for follow-up examination after completed treatment for malignant neoplasm: Secondary | ICD-10-CM | POA: Diagnosis not present

## 2018-07-25 DIAGNOSIS — Z85828 Personal history of other malignant neoplasm of skin: Secondary | ICD-10-CM | POA: Diagnosis not present

## 2018-08-03 DIAGNOSIS — C44311 Basal cell carcinoma of skin of nose: Secondary | ICD-10-CM | POA: Diagnosis not present

## 2018-08-16 DIAGNOSIS — H04123 Dry eye syndrome of bilateral lacrimal glands: Secondary | ICD-10-CM | POA: Diagnosis not present

## 2018-09-03 DIAGNOSIS — H40053 Ocular hypertension, bilateral: Secondary | ICD-10-CM | POA: Diagnosis not present

## 2018-10-15 ENCOUNTER — Telehealth: Payer: Self-pay

## 2018-10-15 NOTE — Telephone Encounter (Signed)
Spoke with patient regarding scheduling overdue F/U. Advised patient that Dr. Yvone Neu is no longer with this practice but she can schedule with another one of our providers. Patient states she is not having any problems at this time but would like to be placed on the 01/19/2019 recall list to F/U at a later date after COVID 19 precautions are lifted and providers are seeing patients in the office again.

## 2018-10-24 DIAGNOSIS — H40053 Ocular hypertension, bilateral: Secondary | ICD-10-CM | POA: Diagnosis not present

## 2018-10-25 ENCOUNTER — Encounter: Payer: Self-pay | Admitting: *Deleted

## 2018-10-31 DIAGNOSIS — L308 Other specified dermatitis: Secondary | ICD-10-CM | POA: Diagnosis not present

## 2018-10-31 DIAGNOSIS — Z85828 Personal history of other malignant neoplasm of skin: Secondary | ICD-10-CM | POA: Diagnosis not present

## 2018-10-31 DIAGNOSIS — Z08 Encounter for follow-up examination after completed treatment for malignant neoplasm: Secondary | ICD-10-CM | POA: Diagnosis not present

## 2018-10-31 DIAGNOSIS — D485 Neoplasm of uncertain behavior of skin: Secondary | ICD-10-CM | POA: Diagnosis not present

## 2019-01-15 ENCOUNTER — Ambulatory Visit: Payer: BLUE CROSS/BLUE SHIELD | Admitting: Family Medicine

## 2019-01-22 ENCOUNTER — Telehealth: Payer: Self-pay

## 2019-01-22 ENCOUNTER — Ambulatory Visit: Payer: BC Managed Care – PPO | Admitting: Family Medicine

## 2019-01-22 NOTE — Telephone Encounter (Signed)
Appt canceled!

## 2019-01-22 NOTE — Telephone Encounter (Signed)
[facility] Night - Client Nonclinical Telephone Record AccessNurse Client [facility] Night - Client Client Site [place] - Night Physician Eliezer Lofts - MD Contact Type Call Who Is Calling Patient / Member / Family / Caregiver Caller Name Juliannah Ohmann Caller Phone Number [phone] Patient Name Brandi Terrell Patient DOB [birth year] Call Type Message Only Information Provided Reason for Call Request to [facility] Appointment Initial Comment Caller states she will not be at her appointment at 08:00 Additional Comment Call Closed By: Gae Gallop Transaction Date/Time: 01/22/2019 6:23:10 AM (ET

## 2019-02-27 ENCOUNTER — Telehealth: Payer: Self-pay | Admitting: *Deleted

## 2019-02-27 NOTE — Telephone Encounter (Signed)
Appointment rescheduled to Dr. Diona Browner for 03/01/2019 at 9:20 am per Dr. Lillie Fragmin request.

## 2019-02-27 NOTE — Telephone Encounter (Signed)
Can you see if she can see Dr. B who has openings on Friday  BP is not urgent at this point, and with psychological issues, PCP involvement is better idea

## 2019-02-27 NOTE — Telephone Encounter (Signed)
Patient called stating that her mom passed away 3 weeks ago and she has been under some stress. Patient stated that she checked her blood pressure this morning about 2 hours after taking her amlodipine/losartan and it was 150/100. Patient stated that she takes her metoprolol at night. Patient stated that she has had a couple of nose bleeds recently. Patient stated that she had a nose bleed this morning that was not bad and she was able to stop it quickly. Patient stated that she checked her blood pressure again about 10 minutes before she called the office and she got 136/96. Patient stated that she has not missed any of her medications recently. Patient stated that she has been a little anxious because of family issues. Patient checked her blood pressure again while on the phone and it was 130/94. Patient stated that she will take her blood pressure kit with her to work today and continue to monitor it. ER precautions given to patient. Patient scheduled for office visit with Dr. Lorelei Pont 02/28/19 at 8:40. Patient negative for covid screening with the exception of a slight headache last night which she feels was related to her blood pressure.

## 2019-02-28 ENCOUNTER — Ambulatory Visit: Payer: BC Managed Care – PPO | Admitting: Family Medicine

## 2019-03-01 ENCOUNTER — Encounter: Payer: Self-pay | Admitting: Family Medicine

## 2019-03-01 ENCOUNTER — Telehealth: Payer: Self-pay

## 2019-03-01 ENCOUNTER — Ambulatory Visit: Payer: BC Managed Care – PPO | Admitting: Family Medicine

## 2019-03-01 ENCOUNTER — Other Ambulatory Visit: Payer: Self-pay

## 2019-03-01 VITALS — BP 129/68 | HR 69 | Temp 98.1°F | Ht 63.75 in | Wt 190.5 lb

## 2019-03-01 DIAGNOSIS — R040 Epistaxis: Secondary | ICD-10-CM | POA: Insufficient documentation

## 2019-03-01 DIAGNOSIS — I10 Essential (primary) hypertension: Secondary | ICD-10-CM | POA: Diagnosis not present

## 2019-03-01 DIAGNOSIS — F4323 Adjustment disorder with mixed anxiety and depressed mood: Secondary | ICD-10-CM | POA: Diagnosis not present

## 2019-03-01 MED ORDER — HYDROXYZINE HCL 25 MG PO TABS: 25.0000 mg | ORAL_TABLET | Freq: Three times a day (TID) | ORAL | 0 refills | Status: DC | PRN

## 2019-03-01 NOTE — Assessment & Plan Note (Addendum)
BP elevations with stressful situations. Well controlled on metoprolol and exforge in office today.  Borderline MDD/GAD Likely increased BP casuing HA and nose bleeding. No red flags.  BPs now trendgin down.. will call with BP measurements early next week.

## 2019-03-01 NOTE — Telephone Encounter (Signed)
Pt left v/m that she had appt with Dr Diona Browner this morning and [facility] [address] does not have med that Dr Diona Browner was going to send in; pt request cb.

## 2019-03-01 NOTE — Progress Notes (Signed)
Chief Complaint  Patient presents with  . Hypertension    Lost mom 2 weeks ago  . Epistaxis  . Headache    History of Present Illness: HPI  53 year old female presents with recent increase in BPs.   HX of HTN   Her mother passed away in last 2 weeks from lung cancer. Heavy smoker.  She feels she has been doing moderately well with her mood until the last week when aunt had big GI bleed, worsening dementia. Emelinda is her caregiver as well. Aunt is very hostile.  This has been a very stressful tme.  Elevated BPs have been causing nose bleeding and headaches at times. Able to get nosebleeds to stop after few minutes.  Headaches resolve with excedrine. Using medication without problems or lightheadedness:  On exforge and metoprolol. Chest pain with exertion:none Edema:none Short of breath:none Average home BPs: occ at times 150/100... she had been feeling stress out that AM.. had to tell ADS that she would not be the guardian. Other issues:  BP Readings from Last 3 Encounters:  03/01/19 129/68  07/17/18 120/82  05/03/18 132/82   Wt Readings from Last 3 Encounters:  03/01/19 190 lb 8 oz (86.4 kg)  07/17/18 196 lb 12 oz (89.2 kg)  05/03/18 195 lb 6.4 oz (88.6 kg)  Body mass index is 32.96 kg/m.    Office Visit from 03/01/2019 in [place] at [place]  PHQ-9 Total Score  15      GAD 7 : Generalized Anxiety Score 03/01/2019  Nervous, Anxious, on Edge 0  Control/stop worrying 1  Worry too much - different things 1  Trouble relaxing 1  Easily annoyed or irritable 1  Afraid - awful might happen 1  Anxiety Difficulty Somewhat difficult    No other bleeding, no vaginal bleeding, no hematuria   COVID 19 screen No recent travel or known exposure to COVID19 The patient denies respiratory symptoms of COVID 19 at this time.  The importance of social distancing was discussed today.   Review of Systems  Constitutional: Negative for chills and fever.  HENT:  Negative for congestion and ear pain.   Eyes: Negative for pain and redness.  Respiratory: Negative for cough and shortness of breath.   Cardiovascular: Negative for chest pain, palpitations and leg swelling.  Gastrointestinal: Negative for abdominal pain, blood in stool, constipation, diarrhea, nausea and vomiting.  Genitourinary: Negative for dysuria.  Musculoskeletal: Negative for falls and myalgias.  Skin: Negative for rash.  Neurological: Negative for dizziness.  Psychiatric/Behavioral: Negative for depression. The patient is not nervous/anxious.       Past Medical History:  Diagnosis Date  . Acid reflux   . Basal cell carcinoma (BCC) of face   . Depression   . Hypertension   . Inflammatory polyps of colon ([place])   . Sleep apnea     reports that she has never smoked. She has never used smokeless tobacco. She reports current alcohol use. She reports that she does not use drugs.   Current Outpatient Medications:  .  amLODipine-valsartan (EXFORGE) 10-160 MG tablet, TAKE 1 TABLET BY MOUTH EVERY DAY FOR BLOOD PRESSURE, Disp: 90 tablet, Rfl: 3 .  cetirizine (ZYRTEC) 5 MG tablet, Take 5 mg by mouth daily., Disp: , Rfl:  .  Cyanocobalamin (B-12 PO), Take by mouth., Disp: , Rfl:  .  estradiol (VIVELLE-DOT) 0.1 MG/24HR patch, PLACE 1 PATCH ONTO THE SKIN TWICE A WEEK., Disp: , Rfl: 11 .  famotidine (PEPCID) 20 MG  tablet, Take 20 mg by mouth daily., Disp: , Rfl:  .  lamoTRIgine (LAMICTAL) 100 MG tablet, Take 100 mg by mouth., Disp: , Rfl:  .  levonorgestrel (MIRENA) 20 MCG/24HR IUD, 1 each by Intrauterine route once., Disp: , Rfl:  .  metoprolol succinate (TOPROL-XL) 100 MG 24 hr tablet, Take 1 tablet (100 mg total) by mouth daily. Take with or immediately following a meal., Disp: 90 tablet, Rfl: 3 .  Multiple Vitamin (MULTI-VITAMINS) TABS, Take by mouth., Disp: , Rfl:  .  prednisoLONE acetate (PRED FORTE) 1 % ophthalmic suspension, Place 1 drop into both eyes 4 (four) times daily as needed.  , Disp: , Rfl:  .  zolpidem (AMBIEN) 10 MG tablet, Take 7.5 mg by mouth at bedtime. , Disp: , Rfl:    Observations/Objective: Blood pressure 129/68, pulse 69, temperature 98.1 F (36.7 C), temperature source Temporal, height 5' 3.75" (1.619 m), weight 190 lb 8 oz (86.4 kg), SpO2 96 %.  Physical Exam Constitutional:      General: She is not in acute distress.    Appearance: Normal appearance. She is well-developed. She is not ill-appearing or toxic-appearing.  HENT:     Head: Normocephalic.     Right Ear: Hearing, tympanic membrane, ear canal and external ear normal. Tympanic membrane is not erythematous, retracted or bulging.     Left Ear: Hearing, tympanic membrane, ear canal and external ear normal. Tympanic membrane is not erythematous, retracted or bulging.     Nose: No mucosal edema or rhinorrhea.     Right Sinus: No maxillary sinus tenderness or frontal sinus tenderness.     Left Sinus: No maxillary sinus tenderness or frontal sinus tenderness.     Mouth/Throat:     Pharynx: Uvula midline.  Eyes:     General: Lids are normal. Lids are everted, no foreign bodies appreciated.     Conjunctiva/sclera: Conjunctivae normal.     Pupils: Pupils are equal, round, and reactive to light.  Neck:     Musculoskeletal: Normal range of motion and neck supple.     Thyroid: No thyroid mass or thyromegaly.     Vascular: No carotid bruit.     Trachea: Trachea normal.  Cardiovascular:     Rate and Rhythm: Normal rate and regular rhythm.     Pulses: Normal pulses.     Heart sounds: Normal heart sounds, S1 normal and S2 normal. No murmur. No friction rub. No gallop.   Pulmonary:     Effort: Pulmonary effort is normal. No tachypnea or respiratory distress.     Breath sounds: Normal breath sounds. No decreased breath sounds, wheezing, rhonchi or rales.  Abdominal:     General: Bowel sounds are normal.     Palpations: Abdomen is soft.     Tenderness: There is no abdominal tenderness.  Skin:     General: Skin is warm and dry.     Findings: No rash.  Neurological:     Mental Status: She is alert.  Psychiatric:        Mood and Affect: Mood is not anxious or depressed.        Speech: Speech normal.        Behavior: Behavior normal. Behavior is cooperative.        Thought Content: Thought content normal.        Judgment: Judgment normal.      Assessment and Plan   Hypertension  BP elevations with stressful situations. Well controlled on metoprolol and exforge in office  today.  Borderline MDD/GAD Likely increased BP casuing HA and nose bleeding. No red flags.  BPs now trendgin down.. will call with BP measurements early next week.   Situational mixed anxiety and depressive disorder  Health issues with aunt and mother passing.  Will treat with atarax prn.   Follow up in 4 weeks.. if not improving may need to start med to treat. Continue Ambien for sleep prn.  Epistaxis No clot of bleeding noted. NO other red flags or other bleeding.  Likely related toi BP increases.  Treat with hydrating gel.. no nasal sprays.     Eliezer Lofts, MD

## 2019-03-01 NOTE — Assessment & Plan Note (Signed)
No clot of bleeding noted. NO other red flags or other bleeding.  Likely related toi BP increases.  Treat with hydrating gel.. no nasal sprays.

## 2019-03-01 NOTE — Patient Instructions (Addendum)
Can use atarax as needed for anxiety or BP increase related to stressed.  Work on relaxation and stress reduction.

## 2019-03-01 NOTE — Telephone Encounter (Signed)
Rx sent in now.

## 2019-03-01 NOTE — Assessment & Plan Note (Signed)
Health issues with aunt and mother passing.  Will treat with atarax prn.   Follow up in 4 weeks.. if not improving may need to start med to treat. Continue Ambien for sleep prn.

## 2019-03-21 DIAGNOSIS — Z23 Encounter for immunization: Secondary | ICD-10-CM | POA: Diagnosis not present

## 2019-03-28 DIAGNOSIS — Z1231 Encounter for screening mammogram for malignant neoplasm of breast: Secondary | ICD-10-CM | POA: Diagnosis not present

## 2019-03-28 LAB — HM MAMMOGRAPHY

## 2019-04-02 ENCOUNTER — Ambulatory Visit: Payer: BC Managed Care – PPO | Admitting: Family Medicine

## 2019-04-02 ENCOUNTER — Encounter: Payer: Self-pay | Admitting: Family Medicine

## 2019-04-04 ENCOUNTER — Telehealth: Payer: Self-pay | Admitting: Family Medicine

## 2019-04-04 DIAGNOSIS — R7303 Prediabetes: Secondary | ICD-10-CM

## 2019-04-04 DIAGNOSIS — E538 Deficiency of other specified B group vitamins: Secondary | ICD-10-CM

## 2019-04-04 NOTE — Telephone Encounter (Signed)
-----   Message from Ellamae Sia sent at 04/02/2019  3:06 PM EDT ----- Regarding: Lab orders for Friday, 10.23.20 Patient is scheduled for CPX labs, please order future labs, Thanks , Karna Christmas

## 2019-04-09 ENCOUNTER — Telehealth: Payer: Self-pay

## 2019-04-09 NOTE — Telephone Encounter (Signed)
Pt wants to know for labs scheduled on 04/12/19 at 7:35 if pt should fast. Advised these are CPX labs so fasting would be most accurate and pt voiced understanding and nothing further needed.

## 2019-04-12 ENCOUNTER — Other Ambulatory Visit: Payer: Self-pay

## 2019-04-12 ENCOUNTER — Other Ambulatory Visit (INDEPENDENT_AMBULATORY_CARE_PROVIDER_SITE_OTHER): Payer: BC Managed Care – PPO

## 2019-04-12 DIAGNOSIS — N951 Menopausal and female climacteric states: Secondary | ICD-10-CM | POA: Diagnosis not present

## 2019-04-12 DIAGNOSIS — R7303 Prediabetes: Secondary | ICD-10-CM

## 2019-04-12 DIAGNOSIS — E538 Deficiency of other specified B group vitamins: Secondary | ICD-10-CM | POA: Diagnosis not present

## 2019-04-12 LAB — COMPREHENSIVE METABOLIC PANEL
ALT: 14 U/L (ref 0–35)
AST: 17 U/L (ref 0–37)
Albumin: 4.4 g/dL (ref 3.5–5.2)
Alkaline Phosphatase: 76 U/L (ref 39–117)
BUN: 19 mg/dL (ref 6–23)
CO2: 29 mEq/L (ref 19–32)
Calcium: 10.3 mg/dL (ref 8.4–10.5)
Chloride: 102 mEq/L (ref 96–112)
Creatinine, Ser: 0.76 mg/dL (ref 0.40–1.20)
GFR: 78.83 mL/min (ref >60.00)
Glucose, Bld: 104 mg/dL — ABNORMAL HIGH (ref 70–99)
Potassium: 3.7 mEq/L (ref 3.5–5.1)
Sodium: 138 mEq/L (ref 135–145)
Total Bilirubin: 0.6 mg/dL (ref 0.2–1.2)
Total Protein: 7.2 g/dL (ref 6.0–8.3)

## 2019-04-12 LAB — VITAMIN B12: Vitamin B-12: 1170 pg/mL — ABNORMAL HIGH (ref 211–911)

## 2019-04-12 LAB — LIPID PANEL
Cholesterol: 226 mg/dL — ABNORMAL HIGH (ref 0–200)
HDL: 44.1 mg/dL (ref >39.00)
LDL Cholesterol: 148 mg/dL — ABNORMAL HIGH (ref 0–99)
NonHDL: 181.51
Total CHOL/HDL Ratio: 5
Triglycerides: 170 mg/dL — ABNORMAL HIGH (ref 0.0–149.0)
VLDL: 34 mg/dL (ref 0.0–40.0)

## 2019-04-12 LAB — HEMOGLOBIN A1C: Hgb A1c MFr Bld: 5.8 % (ref 4.6–6.5)

## 2019-04-12 NOTE — Progress Notes (Signed)
No critical labs need to be addressed urgently. We will discuss labs in detail at upcoming office visit.   

## 2019-04-17 ENCOUNTER — Other Ambulatory Visit: Payer: Self-pay | Admitting: Family Medicine

## 2019-04-17 MED ORDER — AMLODIPINE BESYLATE-VALSARTAN 10-160 MG PO TABS: ORAL_TABLET | ORAL | 1 refills | Status: DC

## 2019-04-17 NOTE — Addendum Note (Signed)
Addended by: Carter Kitten on: 04/17/2019 01:36 PM   Modules accepted: Orders

## 2019-04-22 DIAGNOSIS — H40053 Ocular hypertension, bilateral: Secondary | ICD-10-CM | POA: Diagnosis not present

## 2019-04-26 ENCOUNTER — Other Ambulatory Visit: Payer: Self-pay

## 2019-04-26 ENCOUNTER — Encounter: Payer: Self-pay | Admitting: Family Medicine

## 2019-04-26 ENCOUNTER — Encounter: Payer: BLUE CROSS/BLUE SHIELD | Admitting: Family Medicine

## 2019-04-26 ENCOUNTER — Ambulatory Visit (INDEPENDENT_AMBULATORY_CARE_PROVIDER_SITE_OTHER): Payer: BC Managed Care – PPO | Admitting: Family Medicine

## 2019-04-26 VITALS — BP 120/72 | HR 70 | Temp 98.3°F | Ht 64.0 in | Wt 195.0 lb

## 2019-04-26 DIAGNOSIS — R7303 Prediabetes: Secondary | ICD-10-CM

## 2019-04-26 DIAGNOSIS — Z0000 Encounter for general adult medical examination without abnormal findings: Secondary | ICD-10-CM | POA: Diagnosis not present

## 2019-04-26 DIAGNOSIS — R0602 Shortness of breath: Secondary | ICD-10-CM | POA: Insufficient documentation

## 2019-04-26 DIAGNOSIS — F411 Generalized anxiety disorder: Secondary | ICD-10-CM

## 2019-04-26 DIAGNOSIS — F331 Major depressive disorder, recurrent, moderate: Secondary | ICD-10-CM | POA: Diagnosis not present

## 2019-04-26 DIAGNOSIS — R0609 Other forms of dyspnea: Secondary | ICD-10-CM

## 2019-04-26 DIAGNOSIS — I10 Essential (primary) hypertension: Secondary | ICD-10-CM

## 2019-04-26 DIAGNOSIS — R0600 Dyspnea, unspecified: Secondary | ICD-10-CM

## 2019-04-26 DIAGNOSIS — G4733 Obstructive sleep apnea (adult) (pediatric): Secondary | ICD-10-CM

## 2019-04-26 DIAGNOSIS — E7800 Pure hypercholesterolemia, unspecified: Secondary | ICD-10-CM

## 2019-04-26 NOTE — Assessment & Plan Note (Addendum)
Stable control on lamictal, ambien for sleep. Well controlled. Continue current medication.  Plans talking to grief counselor... info.

## 2019-04-26 NOTE — Progress Notes (Signed)
Chief Complaint  Patient presents with  . Annual Exam    History of Present Illness: HPI   The patient is here for annual wellness exam and preventative care.    Hypertension:   Well controlled on exforge, metoprolol.  Using medication without problems or lightheadedness: none Chest pain with exertion: Edema: Short of breath: She has follow up with cardiology  for DOE. SOB with wakingup a flight of stairs in last few years.  She is always tired. Nml CBC  Has untreated sleep apnea. Cannot tolerate Average home BPs: Other issues:  MDD  stable control, but dealing with loss of mother 2 months ago.   Office Visit from 03/01/2019 in [place] at [facility] Total Score  6       Prediabetes ; stable control Lab Results  Component Value Date   HGBA1C 5.8 04/12/2019   Hypercholserolemia... worsened in last year Lab Results  Component Value Date   CHOL 226 (H) 04/12/2019   HDL 44.10 04/12/2019   LDLCALC 148 (H) 04/12/2019   TRIG 170.0 (H) 04/12/2019   CHOLHDL 5 04/12/2019     COVID 19 screen No recent travel or known exposure to [place] The patient denies respiratory symptoms of COVID 19 at this time.  The importance of social distancing was discussed today.   Review of Systems  Constitutional: Negative for chills and fever.  HENT: Negative for congestion and ear pain.   Eyes: Negative for pain and redness.  Respiratory: Negative for cough and shortness of breath.   Cardiovascular: Negative for chest pain, palpitations and leg swelling.  Gastrointestinal: Negative for abdominal pain, blood in stool, constipation, diarrhea, nausea and vomiting.  Genitourinary: Negative for dysuria.  Musculoskeletal: Negative for falls and myalgias.  Skin: Negative for rash.  Neurological: Negative for dizziness.  Psychiatric/Behavioral: Positive for depression. The patient is not nervous/anxious.       Past Medical History:  Diagnosis Date  . Acid reflux   .  Basal cell carcinoma (BCC) of face   . Depression   . Hypertension   . Inflammatory polyps of colon ([place])   . Sleep apnea     reports that she has never smoked. She has never used smokeless tobacco. She reports current alcohol use. She reports that she does not use drugs.   Current Outpatient Medications:  .  amLODipine-valsartan (EXFORGE) 10-160 MG tablet, TAKE 1 TABLET BY MOUTH EVERY DAY FOR BLOOD PRESSURE, Disp: 90 tablet, Rfl: 1 .  cetirizine (ZYRTEC) 5 MG tablet, Take 5 mg by mouth daily., Disp: , Rfl:  .  Cyanocobalamin (B-12 PO), Take by mouth., Disp: , Rfl:  .  estradiol (VIVELLE-DOT) 0.1 MG/24HR patch, PLACE 1 PATCH ONTO THE SKIN TWICE A WEEK., Disp: , Rfl: 11 .  famotidine (PEPCID) 20 MG tablet, Take 20 mg by mouth daily., Disp: , Rfl:  .  lamoTRIgine (LAMICTAL) 100 MG tablet, Take 100 mg by mouth., Disp: , Rfl:  .  levonorgestrel (MIRENA) 20 MCG/24HR IUD, 1 each by Intrauterine route once., Disp: , Rfl:  .  metoprolol succinate (TOPROL-XL) 100 MG 24 hr tablet, TAKE 1 TABLET BY MOUTH EVERY DAY WITH OR IMMEDIATELY FOLLOWING A MEAL, Disp: 90 tablet, Rfl: 1 .  Multiple Vitamin (MULTI-VITAMINS) TABS, Take by mouth., Disp: , Rfl:  .  zolpidem (AMBIEN) 10 MG tablet, Take 7.5 mg by mouth at bedtime. , Disp: , Rfl:    Observations/Objective: Blood pressure 120/72, pulse 70, temperature 98.3 F (36.8 C), temperature source Temporal, height  5\' 4"  (1.626 m), weight 195 lb (88.5 kg), SpO2 95 %.  Physical Exam Constitutional:      General: She is not in acute distress.    Appearance: Normal appearance. She is well-developed. She is not ill-appearing or toxic-appearing.  HENT:     Head: Normocephalic.     Right Ear: Hearing, tympanic membrane, ear canal and external ear normal. Tympanic membrane is not erythematous, retracted or bulging.     Left Ear: Hearing, tympanic membrane, ear canal and external ear normal. Tympanic membrane is not erythematous, retracted or bulging.     Nose: No  mucosal edema or rhinorrhea.     Right Sinus: No maxillary sinus tenderness or frontal sinus tenderness.     Left Sinus: No maxillary sinus tenderness or frontal sinus tenderness.     Mouth/Throat:     Pharynx: Uvula midline.  Eyes:     General: Lids are normal. Lids are everted, no foreign bodies appreciated.     Conjunctiva/sclera: Conjunctivae normal.     Pupils: Pupils are equal, round, and reactive to light.  Neck:     Musculoskeletal: Normal range of motion and neck supple.     Thyroid: No thyroid mass or thyromegaly.     Vascular: No carotid bruit.     Trachea: Trachea normal.  Cardiovascular:     Rate and Rhythm: Normal rate and regular rhythm.     Pulses: Normal pulses.     Heart sounds: Normal heart sounds, S1 normal and S2 normal. No murmur. No friction rub. No gallop.   Pulmonary:     Effort: Pulmonary effort is normal. No tachypnea or respiratory distress.     Breath sounds: Normal breath sounds. No decreased breath sounds, wheezing, rhonchi or rales.  Abdominal:     General: Bowel sounds are normal.     Palpations: Abdomen is soft.     Tenderness: There is no abdominal tenderness.  Skin:    General: Skin is warm and dry.     Findings: No rash.  Neurological:     Mental Status: She is alert.  Psychiatric:        Mood and Affect: Mood is not anxious or depressed.        Speech: Speech normal.        Behavior: Behavior normal. Behavior is cooperative.        Thought Content: Thought content normal.        Judgment: Judgment normal.      Assessment and Plan  The patient's preventative maintenance and recommended screening tests for an annual wellness exam were reviewed in full today. Brought up to date unless services declined.  Counselled on the importance of diet, exercise, and its role in overall health and mortality. The patient's FH and SH was reviewed, including their home life, tobacco status, and drug and alcohol status.   Vaccines: Uptodate with tdap  and flu. Pap/DVE:  Per GYN, Dr. Jerl Santos on estradiol patch for HRT menopausal syndrome Mammo: 03/28/2019 repeat in 1 year Colon: 04/2014 repeat in 5 years, father with colon cancer DUE NOW, plans to contact GI  Smoking Status:nonsmoker Hep C:  done HIV screen:  refused  Hypertension Well controlled. Continue current medication.   Hypercholesterolemia Some family history of  CAD. Discussed starting statin med, she would like to discuss with cardiology.  Work on [employer] , increase exercise, info provided  Moderate recurrent major depression ([place])  Stable control on lamictal, ambien for sleep. Well controlled. Continue current  medication.  Plans talking to grief counselor... info.   GAD (generalized anxiety disorder) Using hydroxyzine prn.  Obstructive sleep apnea Encouraged setting up oral appliance if not able to use CPAP.  DOE (dyspnea on exertion) No clear pulmonary issue. Ongoing issues for yuear.  Nml cbc, TSH in 06/2018.  Possible deconditioning.  She has appt with cards to consider repeat stress test and or ECHO.    Eliezer Lofts, MD

## 2019-04-26 NOTE — Assessment & Plan Note (Signed)
Well controlled. Continue current medication.  

## 2019-04-26 NOTE — Patient Instructions (Addendum)
Work on [employer], increase exercise and work on weight loss.  Call to set up colonoscopy. Dr. Brandon Melnick at [facility]

## 2019-04-26 NOTE — Assessment & Plan Note (Signed)
No clear pulmonary issue. Ongoing issues for yuear.  Nml cbc, TSH in 06/2018.  Possible deconditioning.  She has appt with cards to consider repeat stress test and or ECHO.

## 2019-04-26 NOTE — Assessment & Plan Note (Addendum)
Some family history of  CAD. Discussed starting statin med, she would like to discuss with cardiology.  Work on [employer] , increase exercise, info provided

## 2019-04-26 NOTE — Assessment & Plan Note (Signed)
Using hydroxyzine prn.

## 2019-04-26 NOTE — Assessment & Plan Note (Signed)
Encouraged setting up oral appliance if not able to use CPAP.

## 2019-05-07 ENCOUNTER — Other Ambulatory Visit: Payer: Self-pay

## 2019-05-07 NOTE — Telephone Encounter (Signed)
Pt left v/m; pt had annual exam on 04/26/19; per v/m pt talked with Dr Diona Browner about taking over filling the zolpidem 10 mg and pt thought Dr Diona Browner ageed. I was unable to reach pt by phone to find out how she is taking the zolpidem 10 mg. On med list instructions are take 7.5 mg at hs? And what pharmacy. FYI to [facility] CMA

## 2019-05-07 NOTE — Telephone Encounter (Signed)
Patient returned Donna's phone call. Patient states she takes Zolpidem 10mg  tablets once daily. She would like this sent to [facility] on [address].

## 2019-05-07 NOTE — Telephone Encounter (Signed)
Left message for Brandi Terrell to call back to confirm Zolpidem dosage and pharmacy.

## 2019-05-07 NOTE — Telephone Encounter (Signed)
Noted. Updated dose. Please review Dr Diona Browner.

## 2019-05-08 MED ORDER — ZOLPIDEM TARTRATE 10 MG PO TABS: 10.0000 mg | ORAL_TABLET | Freq: Every day | ORAL | 0 refills | Status: DC

## 2019-05-12 NOTE — Progress Notes (Signed)
Cardiology Office Note  Date:  05/14/2019   ID:  Brandi Terrell, DOB 1965-07-07, MRN [id number]  PCP:  Jinny Sanders, MD   Chief Complaint  Patient presents with  . other    12 month follow up. Meds reviewed by the pt. verbally. Pt. c/o LE edema, also has shortness of breath when climbing stairs or walking for an extending amount of time.     HPI:  Brandi Terrell is a 53 y.o. female Previously seen 2017 for shortness of breath Sleep apnea , no CPAP ("mild to moderate") Hypertension Last seen in the clinic August 2017 CT scan chest August 2017 no significant coronary calcification or aortic atherosclerosis Non smoker Presents for shortness of breath  No exercise chronic SOB Legs feel deconditioned  Weight up, over the past year Lab work reviewed with her in detail Cholesterol up to 226 LDL 148 HBA1C 5.8, glu 104  BP at home 130s/<90 Mildly elevated on today's visit  In terms of sleep apnea, previous with ENT and neurology. Did not go get the dental piece  CT scan chest August 2017, discussed with her again today  no significant coronary calcification or aortic atherosclerosis   Echo 11/20/2015: Reviewed with her again today Left ventricle: The cavity size was normal. Systolic function was  normal. The estimated ejection fraction was in the range of 60%  to 65%. Wall motion was normal; there were no regional wall  motion abnormalities. Doppler parameters are consistent with  abnormal left ventricular relaxation (grade 1 diastolic  dysfunction). - Aortic valve: There was trivial regurgitation. - Left atrium: The atrium was mildly dilated. - Right ventricle: Systolic function was normal. - Pulmonary arteries: Systolic pressure was within the normal  range.  Nuclear stress test 01/07/2016 reviewed today: Exercise myocardial perfusion imaging study with no significant Ischemia Breast attenuation artifact noted, also with GI uptake artifact Normal  wall motion, EF estimated at 70% No EKG changes concerning for ischemia at peak stress or in recovery. Target heart rate achieved, 89% of maximum predicted heart rate, exercised for 7 minutes, achieved 7 METS Low risk scan   EKG personally reviewed by myself on todays visit Shows normal sinus rhythm with rate 69 bpm no significant ST or T wave changes  PMH:   has a past medical history of Acid reflux, Basal cell carcinoma (BCC) of face, Depression, Hypertension, Inflammatory polyps of colon ([place]), and Sleep apnea.  PSH:    Past Surgical History:  Procedure Laterality Date  . APPENDECTOMY  1976  . CHOLECYSTECTOMY  2005    Current Outpatient Medications  Medication Sig Dispense Refill  . amLODipine-valsartan (EXFORGE) 10-160 MG tablet TAKE 1 TABLET BY MOUTH EVERY DAY FOR BLOOD PRESSURE 90 tablet 1  . cetirizine (ZYRTEC) 5 MG tablet Take 5 mg by mouth daily.    . Cyanocobalamin (B-12 PO) Take by mouth.    . estradiol (VIVELLE-DOT) 0.1 MG/24HR patch PLACE 1 PATCH ONTO THE SKIN TWICE A WEEK.  11  . famotidine (PEPCID) 20 MG tablet Take 20 mg by mouth daily.    Marland Kitchen lamoTRIgine (LAMICTAL) 100 MG tablet Take 100 mg by mouth.    . levonorgestrel (MIRENA) 20 MCG/24HR IUD 1 each by Intrauterine route once.    . metoprolol succinate (TOPROL-XL) 100 MG 24 hr tablet TAKE 1 TABLET BY MOUTH EVERY DAY WITH OR IMMEDIATELY FOLLOWING A MEAL 90 tablet 1  . Multiple Vitamin (MULTI-VITAMINS) TABS Take by mouth.    . zolpidem (AMBIEN) 10 MG tablet  Take 1 tablet (10 mg total) by mouth at bedtime. 30 tablet 0   No current facility-administered medications for this visit.      Allergies:   Sulfa antibiotics, Levofloxacin, Tramadol, and Cephalosporins   Social History:  The patient  reports that she has never smoked. She has never used smokeless tobacco. She reports current alcohol use. She reports that she does not use drugs.   Family History:   family history includes Cancer in her maternal grandfather;  Cancer (age of onset: 63) in her father; Depression in her father and mother; Heart disease in her paternal grandmother; Hypertension in her father, mother, and paternal grandmother; Skin cancer in her father; Stroke in her maternal grandfather, maternal grandmother, paternal grandfather, and paternal grandmother.    Review of Systems: Review of Systems  Constitutional: Negative.   HENT: Negative.   Respiratory: Positive for shortness of breath.   Cardiovascular: Negative.   Gastrointestinal: Negative.   Musculoskeletal: Negative.   Neurological: Negative.   Psychiatric/Behavioral: Negative.   All other systems reviewed and are negative.    PHYSICAL EXAM: VS:  BP 140/88 (BP Location: Left Arm, Patient Position: Sitting, Cuff Size: Normal)   Pulse 69   Temp (!) 97.5 F (36.4 C)   Ht 5\' 4"  (1.626 m)   Wt 195 lb 8 oz (88.7 kg)   BMI 33.56 kg/m  , BMI Body mass index is 33.56 kg/m. Constitutional:  oriented to person, place, and time. No distress.  HENT:  Head: Grossly normal Eyes:  no discharge. No scleral icterus.  Neck: No JVD, no carotid bruits  Cardiovascular: Regular rate and rhythm, no murmurs appreciated Pulmonary/Chest: Clear to auscultation bilaterally, no wheezes or rails Abdominal: Soft.  no distension.  no tenderness.  Musculoskeletal: Normal range of motion Neurological:  normal muscle tone. Coordination normal. No atrophy Skin: Skin warm and dry Psychiatric: normal affect, pleasant   Recent Labs: 07/17/2018: Hemoglobin 13.2; Platelets 354.0; TSH 2.08 04/12/2019: ALT 14; BUN 19; Creatinine, Ser 0.76; Potassium 3.7; Sodium 138    Lipid Panel Lab Results  Component Value Date   CHOL 226 (H) 04/12/2019   HDL 44.10 04/12/2019   LDLCALC 148 (H) 04/12/2019   TRIG 170.0 (H) 04/12/2019     Wt Readings from Last 3 Encounters:  05/14/19 195 lb 8 oz (88.7 kg)  04/26/19 195 lb (88.5 kg)  03/01/19 190 lb 8 oz (86.4 kg)      ASSESSMENT AND PLAN:  Problem  List Items Addressed This Visit      Cardiology Problems   Hypertension   Relevant Orders   EKG 12-Lead   Hypercholesterolemia     Other   Prediabetes    Other Visit Diagnoses    Shortness of breath    -  Primary     SOB Chronic issue, suspect deconditioning Legs feel weak Suggested walking the dog, doing a regular walking program Weight up. Discussed strategies for weight loss  Hyperlipidemia Numbers are higher likely reflecting weight gain and genetic issues Prior scanning with no coronary calcification or aortic atherosclerosis Previous echocardiogram and stress test discussed with her No further testing needed Discussed lifestyle modification  Elevated glucose levels Stressed importance of weight loss, walking program Discussed dietary changes  Family history of coronary disease Both parents were smokers She is not a smoker   Disposition:   F/U as needed   Total encounter time more than 45 minutes  Greater than 50% was spent in counseling and coordination of care with the patient  Signed, Esmond Plants, M.D., Ph.D. [place], [place]

## 2019-05-14 ENCOUNTER — Encounter: Payer: Self-pay | Admitting: Cardiovascular Disease

## 2019-05-14 ENCOUNTER — Other Ambulatory Visit: Payer: Self-pay

## 2019-05-14 ENCOUNTER — Ambulatory Visit (INDEPENDENT_AMBULATORY_CARE_PROVIDER_SITE_OTHER): Payer: BC Managed Care – PPO | Admitting: Cardiovascular Disease

## 2019-05-14 VITALS — BP 140/88 | HR 69 | Temp 97.5°F | Ht 64.0 in | Wt 195.5 lb

## 2019-05-14 DIAGNOSIS — I10 Essential (primary) hypertension: Secondary | ICD-10-CM

## 2019-05-14 DIAGNOSIS — R7303 Prediabetes: Secondary | ICD-10-CM

## 2019-05-14 DIAGNOSIS — R0602 Shortness of breath: Secondary | ICD-10-CM | POA: Diagnosis not present

## 2019-05-14 DIAGNOSIS — E7800 Pure hypercholesterolemia, unspecified: Secondary | ICD-10-CM

## 2019-05-14 NOTE — Patient Instructions (Signed)

## 2019-06-24 ENCOUNTER — Ambulatory Visit: Payer: BC Managed Care – PPO | Admitting: Professional

## 2019-06-25 DIAGNOSIS — F4321 Adjustment disorder with depressed mood: Secondary | ICD-10-CM | POA: Diagnosis not present

## 2019-07-03 DIAGNOSIS — D225 Melanocytic nevi of trunk: Secondary | ICD-10-CM | POA: Diagnosis not present

## 2019-07-03 DIAGNOSIS — D485 Neoplasm of uncertain behavior of skin: Secondary | ICD-10-CM | POA: Diagnosis not present

## 2019-07-05 DIAGNOSIS — F39 Unspecified mood [affective] disorder: Secondary | ICD-10-CM | POA: Diagnosis not present

## 2019-07-11 NOTE — Telephone Encounter (Signed)
Okay to order CT coronary calcium score

## 2019-07-12 ENCOUNTER — Other Ambulatory Visit: Payer: Self-pay | Admitting: *Deleted

## 2019-07-12 DIAGNOSIS — E7800 Pure hypercholesterolemia, unspecified: Secondary | ICD-10-CM

## 2019-07-18 DIAGNOSIS — F4321 Adjustment disorder with depressed mood: Secondary | ICD-10-CM | POA: Diagnosis not present

## 2019-07-23 DIAGNOSIS — F3341 Major depressive disorder, recurrent, in partial remission: Secondary | ICD-10-CM | POA: Diagnosis not present

## 2019-08-02 DIAGNOSIS — F4321 Adjustment disorder with depressed mood: Secondary | ICD-10-CM | POA: Diagnosis not present

## 2019-08-13 DIAGNOSIS — F3341 Major depressive disorder, recurrent, in partial remission: Secondary | ICD-10-CM | POA: Diagnosis not present

## 2019-08-21 DIAGNOSIS — Z23 Encounter for immunization: Secondary | ICD-10-CM | POA: Diagnosis not present

## 2019-08-23 DIAGNOSIS — F4321 Adjustment disorder with depressed mood: Secondary | ICD-10-CM | POA: Diagnosis not present

## 2019-09-05 ENCOUNTER — Other Ambulatory Visit: Payer: Self-pay

## 2019-09-05 ENCOUNTER — Ambulatory Visit (INDEPENDENT_AMBULATORY_CARE_PROVIDER_SITE_OTHER)
Admission: RE | Admit: 2019-09-05 | Discharge: 2019-09-05 | Disposition: A | Discharge status: Home / Self Care | Payer: Self-pay | Source: Ambulatory Visit | Attending: Cardiovascular Disease | Admitting: Cardiovascular Disease

## 2019-09-05 DIAGNOSIS — E7800 Pure hypercholesterolemia, unspecified: Secondary | ICD-10-CM

## 2019-09-09 ENCOUNTER — Telehealth: Payer: Self-pay | Admitting: *Deleted

## 2019-09-09 DIAGNOSIS — F4321 Adjustment disorder with depressed mood: Secondary | ICD-10-CM | POA: Diagnosis not present

## 2019-09-09 DIAGNOSIS — R454 Irritability and anger: Secondary | ICD-10-CM | POA: Diagnosis not present

## 2019-09-09 NOTE — Telephone Encounter (Signed)
-----   Message from Minna Merritts, MD sent at 09/07/2019  3:33 PM EDT ----- CT coronary calcium score Score of 0 Excellent finding, indicating no calcified coronary atherosclerotic plaque noted  No other significant findings on the scan were picked up

## 2019-09-09 NOTE — Telephone Encounter (Signed)
Left voicemail message to call back for results.  

## 2019-09-10 DIAGNOSIS — F3341 Major depressive disorder, recurrent, in partial remission: Secondary | ICD-10-CM | POA: Diagnosis not present

## 2019-09-11 NOTE — Telephone Encounter (Signed)
Spoke with patient and reviewed results with her. She verbalized understanding with no further questions at this time.  

## 2019-09-19 DIAGNOSIS — K639 Disease of intestine, unspecified: Secondary | ICD-10-CM

## 2019-09-19 DIAGNOSIS — Z1211 Encounter for screening for malignant neoplasm of colon: Secondary | ICD-10-CM

## 2019-09-24 DIAGNOSIS — F4321 Adjustment disorder with depressed mood: Secondary | ICD-10-CM | POA: Diagnosis not present

## 2019-09-30 DIAGNOSIS — Z6833 Body mass index (BMI) 33.0-33.9, adult: Secondary | ICD-10-CM | POA: Diagnosis not present

## 2019-09-30 DIAGNOSIS — Z86010 Personal history of colonic polyps: Secondary | ICD-10-CM | POA: Diagnosis not present

## 2019-10-02 DIAGNOSIS — F4323 Adjustment disorder with mixed anxiety and depressed mood: Secondary | ICD-10-CM | POA: Diagnosis not present

## 2019-10-09 DIAGNOSIS — Z08 Encounter for follow-up examination after completed treatment for malignant neoplasm: Secondary | ICD-10-CM | POA: Diagnosis not present

## 2019-10-09 DIAGNOSIS — D2272 Melanocytic nevi of left lower limb, including hip: Secondary | ICD-10-CM | POA: Diagnosis not present

## 2019-10-09 DIAGNOSIS — Z85828 Personal history of other malignant neoplasm of skin: Secondary | ICD-10-CM | POA: Diagnosis not present

## 2019-10-09 DIAGNOSIS — L609 Nail disorder, unspecified: Secondary | ICD-10-CM | POA: Diagnosis not present

## 2019-10-13 ENCOUNTER — Other Ambulatory Visit: Payer: Self-pay | Admitting: Family Medicine

## 2019-10-21 DIAGNOSIS — Z79890 Hormone replacement therapy (postmenopausal): Secondary | ICD-10-CM | POA: Diagnosis not present

## 2019-10-21 DIAGNOSIS — Z975 Presence of (intrauterine) contraceptive device: Secondary | ICD-10-CM | POA: Diagnosis not present

## 2019-10-21 DIAGNOSIS — H40003 Preglaucoma, unspecified, bilateral: Secondary | ICD-10-CM | POA: Diagnosis not present

## 2019-10-21 DIAGNOSIS — R102 Pelvic and perineal pain: Secondary | ICD-10-CM | POA: Diagnosis not present

## 2019-10-21 DIAGNOSIS — N951 Menopausal and female climacteric states: Secondary | ICD-10-CM | POA: Diagnosis not present

## 2019-10-22 DIAGNOSIS — F3341 Major depressive disorder, recurrent, in partial remission: Secondary | ICD-10-CM | POA: Diagnosis not present

## 2019-10-23 DIAGNOSIS — Z975 Presence of (intrauterine) contraceptive device: Secondary | ICD-10-CM | POA: Diagnosis not present

## 2019-10-23 DIAGNOSIS — R102 Pelvic and perineal pain: Secondary | ICD-10-CM | POA: Diagnosis not present

## 2019-10-28 DIAGNOSIS — H40003 Preglaucoma, unspecified, bilateral: Secondary | ICD-10-CM | POA: Diagnosis not present

## 2019-10-29 DIAGNOSIS — F4323 Adjustment disorder with mixed anxiety and depressed mood: Secondary | ICD-10-CM | POA: Diagnosis not present

## 2019-11-05 DIAGNOSIS — N841 Polyp of cervix uteri: Secondary | ICD-10-CM | POA: Diagnosis not present

## 2019-11-13 DIAGNOSIS — Z882 Allergy status to sulfonamides status: Secondary | ICD-10-CM | POA: Diagnosis not present

## 2019-11-13 DIAGNOSIS — K644 Residual hemorrhoidal skin tags: Secondary | ICD-10-CM | POA: Diagnosis not present

## 2019-11-13 DIAGNOSIS — G4733 Obstructive sleep apnea (adult) (pediatric): Secondary | ICD-10-CM | POA: Diagnosis not present

## 2019-11-13 DIAGNOSIS — E785 Hyperlipidemia, unspecified: Secondary | ICD-10-CM | POA: Diagnosis not present

## 2019-11-13 DIAGNOSIS — F329 Major depressive disorder, single episode, unspecified: Secondary | ICD-10-CM | POA: Diagnosis not present

## 2019-11-13 DIAGNOSIS — I10 Essential (primary) hypertension: Secondary | ICD-10-CM | POA: Diagnosis not present

## 2019-11-13 DIAGNOSIS — Z1211 Encounter for screening for malignant neoplasm of colon: Secondary | ICD-10-CM | POA: Diagnosis not present

## 2019-11-13 DIAGNOSIS — Z86010 Personal history of colonic polyps: Secondary | ICD-10-CM | POA: Diagnosis not present

## 2019-11-13 DIAGNOSIS — E669 Obesity, unspecified: Secondary | ICD-10-CM | POA: Diagnosis not present

## 2019-11-13 DIAGNOSIS — K219 Gastro-esophageal reflux disease without esophagitis: Secondary | ICD-10-CM | POA: Diagnosis not present

## 2019-11-13 DIAGNOSIS — K5730 Diverticulosis of large intestine without perforation or abscess without bleeding: Secondary | ICD-10-CM | POA: Diagnosis not present

## 2019-11-13 DIAGNOSIS — Z9049 Acquired absence of other specified parts of digestive tract: Secondary | ICD-10-CM | POA: Diagnosis not present

## 2019-11-15 DIAGNOSIS — F4321 Adjustment disorder with depressed mood: Secondary | ICD-10-CM | POA: Diagnosis not present

## 2019-11-15 DIAGNOSIS — F4322 Adjustment disorder with anxiety: Secondary | ICD-10-CM | POA: Diagnosis not present

## 2019-11-28 DIAGNOSIS — F4321 Adjustment disorder with depressed mood: Secondary | ICD-10-CM | POA: Diagnosis not present

## 2019-12-19 DIAGNOSIS — J069 Acute upper respiratory infection, unspecified: Secondary | ICD-10-CM | POA: Diagnosis not present

## 2019-12-30 DIAGNOSIS — F4321 Adjustment disorder with depressed mood: Secondary | ICD-10-CM | POA: Diagnosis not present

## 2020-01-21 DIAGNOSIS — F4321 Adjustment disorder with depressed mood: Secondary | ICD-10-CM | POA: Diagnosis not present

## 2020-01-28 DIAGNOSIS — F3341 Major depressive disorder, recurrent, in partial remission: Secondary | ICD-10-CM | POA: Diagnosis not present

## 2020-02-18 DIAGNOSIS — F3341 Major depressive disorder, recurrent, in partial remission: Secondary | ICD-10-CM | POA: Diagnosis not present

## 2020-02-27 DIAGNOSIS — Z713 Dietary counseling and surveillance: Secondary | ICD-10-CM | POA: Diagnosis not present

## 2020-02-28 ENCOUNTER — Other Ambulatory Visit: Payer: Self-pay | Admitting: Family Medicine

## 2020-03-03 DIAGNOSIS — Z0001 Encounter for general adult medical examination with abnormal findings: Secondary | ICD-10-CM | POA: Diagnosis not present

## 2020-03-10 DIAGNOSIS — F3341 Major depressive disorder, recurrent, in partial remission: Secondary | ICD-10-CM | POA: Diagnosis not present

## 2020-03-11 DIAGNOSIS — Z713 Dietary counseling and surveillance: Secondary | ICD-10-CM | POA: Diagnosis not present

## 2020-03-19 ENCOUNTER — Other Ambulatory Visit: Payer: Self-pay | Admitting: *Deleted

## 2020-03-19 DIAGNOSIS — Z23 Encounter for immunization: Secondary | ICD-10-CM | POA: Diagnosis not present

## 2020-03-25 DIAGNOSIS — Z713 Dietary counseling and surveillance: Secondary | ICD-10-CM | POA: Diagnosis not present

## 2020-03-30 DIAGNOSIS — Z1231 Encounter for screening mammogram for malignant neoplasm of breast: Secondary | ICD-10-CM | POA: Diagnosis not present

## 2020-03-30 LAB — HM MAMMOGRAPHY

## 2020-04-07 DIAGNOSIS — F3341 Major depressive disorder, recurrent, in partial remission: Secondary | ICD-10-CM | POA: Diagnosis not present

## 2020-04-09 ENCOUNTER — Encounter: Payer: Self-pay | Admitting: Family Medicine

## 2020-04-15 DIAGNOSIS — L7211 Pilar cyst: Secondary | ICD-10-CM | POA: Diagnosis not present

## 2020-04-15 DIAGNOSIS — D485 Neoplasm of uncertain behavior of skin: Secondary | ICD-10-CM | POA: Diagnosis not present

## 2020-04-16 DIAGNOSIS — Z713 Dietary counseling and surveillance: Secondary | ICD-10-CM | POA: Diagnosis not present

## 2020-04-23 ENCOUNTER — Telehealth: Payer: Self-pay | Admitting: Family Medicine

## 2020-04-23 DIAGNOSIS — R7303 Prediabetes: Secondary | ICD-10-CM

## 2020-04-23 DIAGNOSIS — E538 Deficiency of other specified B group vitamins: Secondary | ICD-10-CM

## 2020-04-23 DIAGNOSIS — E7800 Pure hypercholesterolemia, unspecified: Secondary | ICD-10-CM

## 2020-04-23 NOTE — Telephone Encounter (Signed)
-----   Message from Cloyd Stagers, RT sent at 04/09/2020  2:07 PM EDT ----- Regarding: Lab Orders for Friday 11.5.2021 Please place lab orders for Friday 11.5.2021, office visit for physical on Friday 11.12.2021 Thank you, Dyke Maes RT(R)

## 2020-04-24 ENCOUNTER — Other Ambulatory Visit (INDEPENDENT_AMBULATORY_CARE_PROVIDER_SITE_OTHER): Payer: BC Managed Care – PPO

## 2020-04-24 ENCOUNTER — Other Ambulatory Visit: Payer: Self-pay

## 2020-04-24 DIAGNOSIS — R7303 Prediabetes: Secondary | ICD-10-CM

## 2020-04-24 DIAGNOSIS — E538 Deficiency of other specified B group vitamins: Secondary | ICD-10-CM

## 2020-04-24 DIAGNOSIS — E7800 Pure hypercholesterolemia, unspecified: Secondary | ICD-10-CM | POA: Diagnosis not present

## 2020-04-24 LAB — COMPREHENSIVE METABOLIC PANEL
ALT: 19 U/L (ref 0–35)
AST: 19 U/L (ref 0–37)
Albumin: 4.4 g/dL (ref 3.5–5.2)
Alkaline Phosphatase: 83 U/L (ref 39–117)
BUN: 18 mg/dL (ref 6–23)
CO2: 28 mEq/L (ref 19–32)
Calcium: 10.3 mg/dL (ref 8.4–10.5)
Chloride: 104 mEq/L (ref 96–112)
Creatinine, Ser: 0.65 mg/dL (ref 0.40–1.20)
GFR: 98.26 mL/min (ref >60.00)
Glucose, Bld: 100 mg/dL — ABNORMAL HIGH (ref 70–99)
Potassium: 4 mEq/L (ref 3.5–5.1)
Sodium: 140 mEq/L (ref 135–145)
Total Bilirubin: 0.6 mg/dL (ref 0.2–1.2)
Total Protein: 7.2 g/dL (ref 6.0–8.3)

## 2020-04-24 LAB — VITAMIN B12: Vitamin B-12: 556 pg/mL (ref 211–911)

## 2020-04-24 LAB — LIPID PANEL
Cholesterol: 186 mg/dL (ref 0–200)
HDL: 43.6 mg/dL (ref >39.00)
LDL Cholesterol: 112 mg/dL — ABNORMAL HIGH (ref 0–99)
NonHDL: 142.62
Total CHOL/HDL Ratio: 4
Triglycerides: 154 mg/dL — ABNORMAL HIGH (ref 0.0–149.0)
VLDL: 30.8 mg/dL (ref 0.0–40.0)

## 2020-04-24 LAB — HEMOGLOBIN A1C: Hgb A1c MFr Bld: 6.1 % (ref 4.6–6.5)

## 2020-04-24 NOTE — Progress Notes (Signed)
No critical labs need to be addressed urgently. We will discuss labs in detail at upcoming office visit.   

## 2020-04-27 DIAGNOSIS — H40003 Preglaucoma, unspecified, bilateral: Secondary | ICD-10-CM | POA: Diagnosis not present

## 2020-05-01 ENCOUNTER — Encounter: Payer: Self-pay | Admitting: Family Medicine

## 2020-05-01 ENCOUNTER — Other Ambulatory Visit: Payer: Self-pay

## 2020-05-01 ENCOUNTER — Ambulatory Visit (INDEPENDENT_AMBULATORY_CARE_PROVIDER_SITE_OTHER): Payer: BC Managed Care – PPO | Admitting: Family Medicine

## 2020-05-01 VITALS — BP 126/84 | HR 79 | Temp 98.3°F | Ht 63.75 in | Wt 190.0 lb

## 2020-05-01 DIAGNOSIS — Z0000 Encounter for general adult medical examination without abnormal findings: Secondary | ICD-10-CM | POA: Diagnosis not present

## 2020-05-01 DIAGNOSIS — E7800 Pure hypercholesterolemia, unspecified: Secondary | ICD-10-CM | POA: Diagnosis not present

## 2020-05-01 DIAGNOSIS — R7303 Prediabetes: Secondary | ICD-10-CM

## 2020-05-01 DIAGNOSIS — F331 Major depressive disorder, recurrent, moderate: Secondary | ICD-10-CM

## 2020-05-01 DIAGNOSIS — I10 Essential (primary) hypertension: Secondary | ICD-10-CM | POA: Diagnosis not present

## 2020-05-01 DIAGNOSIS — E538 Deficiency of other specified B group vitamins: Secondary | ICD-10-CM

## 2020-05-01 NOTE — Patient Instructions (Addendum)
Keep working on healthy eating and increasing activity. Work on decreasing carbs and sweets in diet. Avoid soda. Increase water.

## 2020-05-01 NOTE — Assessment & Plan Note (Signed)
Tolerable control with diet and lifestyle control.

## 2020-05-01 NOTE — Assessment & Plan Note (Signed)
Well controlled. Continue current medication.  

## 2020-05-01 NOTE — Assessment & Plan Note (Signed)
Normal on supplementation.

## 2020-05-01 NOTE — Progress Notes (Signed)
Chief Complaint  Patient presents with  . Annual Exam    History of Present Illness: HPI  The patient is here for annual wellness exam and preventative care.    Elevated Cholesterol:  Tolerable control with diet and lifestyle control. Lab Results  Component Value Date   CHOL 186 04/24/2020   HDL 43.60 04/24/2020   LDLCALC 112 (H) 04/24/2020   TRIG 154.0 (H) 04/24/2020   CHOLHDL 4 04/24/2020  The 10-year ASCVD risk score Mikey Bussing DC Jr., et al., 2013) is: 3.1%   Values used to calculate the score:     Age: 54 years     Sex: Female     Is Non-Hispanic African American: No     Diabetic: No     Tobacco smoker: No     Systolic Blood Pressure: 416 mmHg     Is BP treated: Yes     HDL Cholesterol: 43.6 mg/dL     Total Cholesterol: 186 mg/dL Using medications without problems: Muscle aches:  Diet compliance: moderate.. seeing nutritionist and making changes. Exercise: walking Other complaints: Wt Readings from Last 3 Encounters:  05/01/20 190 lb (86.2 kg)  05/14/19 195 lb 8 oz (88.7 kg)  04/26/19 195 lb (88.5 kg)  Body mass index is 32.87 kg/m.   Hypertension:   Good control on amlodipine/valsartan BP Readings from Last 3 Encounters:  05/01/20 126/84  05/14/19 140/88  04/26/19 120/72  Using medication without problems or lightheadedness:  none Chest pain with exertion none Edema:none Short of breath:none Average home BPs: not chekcing Other issues:  Prediabetes  Stable control Lab Results  Component Value Date   HGBA1C 6.1 04/24/2020   Sleep apnea:  Not using CPAP as cannot tolerate it.   B12 deficiency Normal on supplementation.  MDD:  Stable control on Lamictal per Dr. Doyle Askew.. improved on high dose  and with addition of cytomel.  Insomnia: on ambien.   Office Visit from 05/01/2020 in [place] at [facility] Total Score 2      This visit occurred during the SARS-CoV-2 public health emergency.  Safety protocols were in place, including  screening questions prior to the visit, additional usage of staff PPE, and extensive cleaning of exam room while observing appropriate contact time as indicated for disinfecting solutions.   COVID 19 screen:  No recent travel or known exposure to COVID19 The patient denies respiratory symptoms of COVID 19 at this time. The importance of social distancing was discussed today.     Review of Systems  Constitutional: Negative for chills and fever.  HENT: Negative for congestion and ear pain.   Eyes: Negative for pain and redness.  Respiratory: Negative for cough and shortness of breath.   Cardiovascular: Negative for chest pain, palpitations and leg swelling.  Gastrointestinal: Negative for abdominal pain, blood in stool, constipation, diarrhea, nausea and vomiting.  Genitourinary: Negative for dysuria.  Musculoskeletal: Negative for falls and myalgias.  Skin: Negative for rash.  Neurological: Negative for dizziness.  Psychiatric/Behavioral: Negative for depression. The patient is not nervous/anxious.       Past Medical History:  Diagnosis Date  . Acid reflux   . Basal cell carcinoma (BCC) of face   . Depression   . Hypertension   . Inflammatory polyps of colon ([place])   . Sleep apnea     reports that she has never smoked. She has never used smokeless tobacco. She reports current alcohol use. She reports that she does not use drugs.   Current Outpatient  Medications:  .  amLODipine-valsartan (EXFORGE) 10-160 MG tablet, TAKE 1 TABLET BY MOUTH EVERY DAY FOR BLOOD PRESSURE, Disp: 90 tablet, Rfl: 0 .  cetirizine (ZYRTEC) 5 MG tablet, Take 5 mg by mouth daily., Disp: , Rfl:  .  estradiol (VIVELLE-DOT) 0.05 MG/24HR patch, 1 patch 2 (two) times a week., Disp: , Rfl:  .  lamoTRIgine (LAMICTAL) 100 MG tablet, Take 200 mg by mouth. , Disp: , Rfl:  .  levonorgestrel (MIRENA) 20 MCG/24HR IUD, 1 each by Intrauterine route once., Disp: , Rfl:  .  liothyronine (CYTOMEL) 25 MCG tablet, Take 25 mcg by  mouth every morning., Disp: , Rfl:  .  metoprolol succinate (TOPROL-XL) 100 MG 24 hr tablet, TAKE 1 TABLET BY MOUTH EVERY DAY WITH OR IMMEDIATELY FOLLOWING A MEAL, Disp: 90 tablet, Rfl: 1 .  Multiple Vitamin (MULTI-VITAMINS) TABS, Take by mouth., Disp: , Rfl:  .  omeprazole (PRILOSEC) 20 MG capsule, Take 20 mg by mouth daily., Disp: , Rfl:  .  zolpidem (AMBIEN) 10 MG tablet, Take 1 tablet (10 mg total) by mouth at bedtime., Disp: 30 tablet, Rfl: 0   Observations/Objective: There were no vitals taken for this visit.  Physical Exam Constitutional:      General: She is not in acute distress.    Appearance: Normal appearance. She is well-developed. She is not ill-appearing or toxic-appearing.  HENT:     Head: Normocephalic.     Right Ear: Hearing, tympanic membrane, ear canal and external ear normal.     Left Ear: Hearing, tympanic membrane, ear canal and external ear normal.     Nose: Nose normal.  Eyes:     General: Lids are normal. Lids are everted, no foreign bodies appreciated.     Conjunctiva/sclera: Conjunctivae normal.     Pupils: Pupils are equal, round, and reactive to light.  Neck:     Thyroid: No thyroid mass or thyromegaly.     Vascular: No carotid bruit.     Trachea: Trachea normal.  Cardiovascular:     Rate and Rhythm: Normal rate and regular rhythm.     Heart sounds: Normal heart sounds, S1 normal and S2 normal. No murmur heard.  No gallop.   Pulmonary:     Effort: Pulmonary effort is normal. No respiratory distress.     Breath sounds: Normal breath sounds. No wheezing, rhonchi or rales.  Abdominal:     General: Bowel sounds are normal. There is no distension or abdominal bruit.     Palpations: Abdomen is soft. There is no fluid wave or mass.     Tenderness: There is no abdominal tenderness. There is no guarding or rebound.     Hernia: No hernia is present.  Musculoskeletal:     Cervical back: Normal range of motion and neck supple.  Lymphadenopathy:     Cervical:  No cervical adenopathy.  Skin:    General: Skin is warm and dry.     Findings: No rash.  Neurological:     Mental Status: She is alert.     Cranial Nerves: No cranial nerve deficit.     Sensory: No sensory deficit.  Psychiatric:        Mood and Affect: Mood is not anxious or depressed.        Speech: Speech normal.        Behavior: Behavior normal. Behavior is cooperative.        Judgment: Judgment normal.      Assessment and Plan The patient's preventative maintenance  and recommended screening tests for an annual wellness exam were reviewed in full today. Brought up to date unless services declined.  Counselled on the importance of diet, exercise, and its role in overall health and mortality. The patient's FH and SH was reviewed, including their home life, tobacco status, and drug and alcohol status.   Vaccines: uptodate with flu, Td, shingrix and COVID x 3 Pap/DVE:Per GYN, Dr. Jerl Santos on estradiol patch for HRT menopausal syndrome Mammo:03/30/2020 repeat in 1 year Colon:11/13/2019, father with colon cancer plan repeat in 5 years Smoking Status: nonsmoker  ETOH; social Hep C:done HIV screen:refused   Prediabetes Stable control.  Hypertension Well controlled. Continue current medication.   Hypercholesterolemia Tolerable control with diet and lifestyle control.  Moderate recurrent major depression ([place]) Well controlled. Continue current medication. Followed by psychiatry.  B12 deficiency Normal on supplementation.      Eliezer Lofts, MD

## 2020-05-01 NOTE — Assessment & Plan Note (Signed)
Well controlled. Continue current medication. Followed by psychiatry.

## 2020-05-01 NOTE — Assessment & Plan Note (Signed)
Stable control. 

## 2020-05-28 DIAGNOSIS — L7211 Pilar cyst: Secondary | ICD-10-CM | POA: Diagnosis not present

## 2020-05-28 DIAGNOSIS — D485 Neoplasm of uncertain behavior of skin: Secondary | ICD-10-CM | POA: Diagnosis not present

## 2020-05-31 ENCOUNTER — Other Ambulatory Visit: Payer: Self-pay | Admitting: Family Medicine

## 2020-06-02 DIAGNOSIS — F3341 Major depressive disorder, recurrent, in partial remission: Secondary | ICD-10-CM | POA: Diagnosis not present

## 2020-06-03 DIAGNOSIS — D225 Melanocytic nevi of trunk: Secondary | ICD-10-CM | POA: Diagnosis not present

## 2020-06-03 DIAGNOSIS — Z85828 Personal history of other malignant neoplasm of skin: Secondary | ICD-10-CM | POA: Diagnosis not present

## 2020-06-03 DIAGNOSIS — Z08 Encounter for follow-up examination after completed treatment for malignant neoplasm: Secondary | ICD-10-CM | POA: Diagnosis not present

## 2020-06-03 DIAGNOSIS — D2261 Melanocytic nevi of right upper limb, including shoulder: Secondary | ICD-10-CM | POA: Diagnosis not present

## 2020-06-09 DIAGNOSIS — F3341 Major depressive disorder, recurrent, in partial remission: Secondary | ICD-10-CM | POA: Diagnosis not present

## 2020-06-25 DIAGNOSIS — L7211 Pilar cyst: Secondary | ICD-10-CM | POA: Diagnosis not present

## 2020-06-25 DIAGNOSIS — D485 Neoplasm of uncertain behavior of skin: Secondary | ICD-10-CM | POA: Diagnosis not present

## 2020-07-02 DIAGNOSIS — Z713 Dietary counseling and surveillance: Secondary | ICD-10-CM | POA: Diagnosis not present

## 2020-07-16 DIAGNOSIS — D485 Neoplasm of uncertain behavior of skin: Secondary | ICD-10-CM | POA: Diagnosis not present

## 2020-07-16 DIAGNOSIS — L7211 Pilar cyst: Secondary | ICD-10-CM | POA: Diagnosis not present

## 2020-08-13 ENCOUNTER — Telehealth: Payer: Self-pay

## 2020-08-13 NOTE — Telephone Encounter (Signed)
Noted  

## 2020-08-13 NOTE — Telephone Encounter (Signed)
Cindy at front desk brought my chart note about appt pt scheduled on 08/21/20 with Dr Diona Browner with blurred vision, excessive thirst, and frequent urination. More frequent but not severe H/As and foot pain. I spoke with pt and she said for a few wks pt has been noticing above symptoms.offered pt sooner appt with a different provider but pt wants to see DR Diona Browner and I did change appt after speaking with Butch Penny CMA that appt needs to be more than 20'. Pt kept appt on 08/21/20 at 9:40 AM which could be 40' appt. Pt will come to office fasting. No covid symptoms and no known exposure to covid. Pt is prediabetic and is on no med for diabetes; pt is also concerned that her BP might be up. UC & ED precautions given and pt voiced understanding. Sending note to Dr Diona Browner.

## 2020-08-21 ENCOUNTER — Ambulatory Visit: Payer: BC Managed Care – PPO | Admitting: Family Medicine

## 2020-08-21 ENCOUNTER — Encounter: Payer: Self-pay | Admitting: Family Medicine

## 2020-08-21 ENCOUNTER — Other Ambulatory Visit: Payer: Self-pay

## 2020-08-21 VITALS — BP 110/80 | HR 68 | Temp 98.3°F | Ht 63.75 in | Wt 188.2 lb

## 2020-08-21 DIAGNOSIS — R631 Polydipsia: Secondary | ICD-10-CM | POA: Insufficient documentation

## 2020-08-21 DIAGNOSIS — M7662 Achilles tendinitis, left leg: Secondary | ICD-10-CM | POA: Diagnosis not present

## 2020-08-21 DIAGNOSIS — R7303 Prediabetes: Secondary | ICD-10-CM

## 2020-08-21 DIAGNOSIS — I10 Essential (primary) hypertension: Secondary | ICD-10-CM

## 2020-08-21 DIAGNOSIS — R350 Frequency of micturition: Secondary | ICD-10-CM | POA: Diagnosis not present

## 2020-08-21 LAB — POCT GLYCOSYLATED HEMOGLOBIN (HGB A1C): Hemoglobin A1C: 5.5 % (ref 4.0–5.6)

## 2020-08-21 LAB — POC URINALSYSI DIPSTICK (AUTOMATED)
Bilirubin, UA: NEGATIVE
Blood, UA: NEGATIVE
Glucose, UA: NEGATIVE
Ketones, UA: NEGATIVE
Leukocytes, UA: NEGATIVE
Nitrite, UA: NEGATIVE
Protein, UA: POSITIVE — AB
Spec Grav, UA: 1.025 (ref 1.010–1.025)
Urobilinogen, UA: 0.2 E.U./dL
pH, UA: 5.5 (ref 5.0–8.0)

## 2020-08-21 LAB — GLUCOSE, POCT (MANUAL RESULT ENTRY): POC Glucose: 104 mg/dl — AB (ref 70–99)

## 2020-08-21 NOTE — Assessment & Plan Note (Signed)
No sign on UA of UTI.  No glucose.

## 2020-08-21 NOTE — Assessment & Plan Note (Signed)
Good control.. not causing headache or blurred vision

## 2020-08-21 NOTE — Assessment & Plan Note (Signed)
Improving control, not a clear cause of her increased thirst.

## 2020-08-21 NOTE — Assessment & Plan Note (Signed)
Treat with topical NSAID, ice and home PT.. if not improving follow up.

## 2020-08-21 NOTE — Patient Instructions (Addendum)
Apply topical Voltaren gel to heel 3-4 times daily.  Start home physical therapy for achilles tendonitis.  no diabetes, blood pressure well controlled.   Rosen's Emergency Medicine: Concepts and Clinical Practice (9th ed., pp. 2500-3704). [place], [place]: [employer]. Retrieved from https://www.clinicalkey.com/#!/content/book/[email or web address]?scrollTo=%[email or web address]">  Achilles Tendinitis  Achilles tendinitis is inflammation of the tough, cord-like band that attaches the lower leg muscles to the heel bone (Achilles tendon). This is usually caused by overusing the tendon and the ankle joint. Achilles tendinitis usually gets better over time with treatment and caring for yourself at home. It can take weeks or months to heal completely. What are the causes? This condition may be caused by:  A sudden increase in exercise or activity, such as running.  Doing the same exercises or activities, such as jumping, over and over.  Not warming up calf muscles before exercising.  Exercising in shoes that are worn out or not made for exercise.  Having arthritis or a bone growth (spur) on the back of the heel bone. This can rub against the tendon and hurt it.  Age-related wear and tear. Tendons become less flexible with age and are more likely to be injured. What are the signs or symptoms? Common symptoms of this condition include:  Pain in the Achilles tendon or in the back of the leg, just above the heel. The pain usually gets worse with exercise.  Stiffness or soreness in the back of the leg, especially in the morning.  Swelling of the skin over the Achilles tendon.  Thickening of the tendon.  Trouble standing on tiptoe. How is this diagnosed? This condition is diagnosed based on your symptoms and a physical exam. You may have tests, including:  X-rays.  MRI. How is this treated? The goal of treatment is to relieve symptoms and help your injury heal. Treatment may  include:  Decreasing or stopping activities that caused the tendinitis. This may mean switching to low-impact exercises like biking or swimming.  Icing the injured area.  Doing physical therapy, including strengthening and stretching exercises.  Taking NSAIDs, such as ibuprofen, to help relieve pain and swelling.  Using supportive shoes, wraps, heel lifts, or a walking boot (air cast).  Having surgery. This may be done if your symptoms do not improve after other treatments.  Using high-energy shock wave impulses to stimulate the healing process (extracorporeal shock wave therapy). This is rare.  Having an injection of medicines that help relieve inflammation (corticosteroids). This is rare. Follow these instructions at home: If you have an air cast:  Wear the air cast as told by your health care provider. Remove it only as told by your health care provider.  Loosen it if your toes tingle, become numb, or turn cold and blue.  Keep it clean.  If the air cast is not waterproof: ? Do not let it get wet. ? Cover it with a watertight covering when you take a bath or shower. Managing pain, stiffness, and swelling  If directed, put ice on the injured area. To do this: ? If you have a removable air cast, remove it as told by your health care provider. ? Put ice in a plastic bag. ? Place a towel between your skin and the bag. ? Leave the ice on for 20 minutes, 2-3 times a day.  Move your toes often to reduce stiffness and swelling.  Raise (elevate) your foot above the level of your heart while you are sitting or lying down.   Activity  Gradually  return to your normal activities as told by your health care provider. Ask your health care provider what activities are safe for you.  Do not do activities that cause pain.  Consider doing low-impact exercises, like cycling or swimming.  Ask your health care provider when it is safe to drive if you have an air cast on your foot.  If  physical therapy was prescribed, do exercises as told by your health care provider or physical therapist. General instructions  If directed, wrap your foot with an elastic bandage or other wrap. This can help to keep your tendon from moving too much while it heals. Your health care provider will show you how to wrap your foot correctly.  Wear supportive shoes or heel lifts only as told by your health care provider.  Take over-the-counter and prescription medicines only as told by your health care provider.  Keep all follow-up visits as told by your health care provider. This is important. Contact a health care provider if you:  Have symptoms that get worse.  Have pain that does not get better with medicine.  Develop new, unexplained symptoms.  Develop warmth and swelling in your foot.  Have a fever. Get help right away if you:  Have a sudden popping sound or sensation in your Achilles tendon followed by severe pain.  Cannot move your toes or foot.  Cannot put any weight on your foot.  Your foot or toes become numb and look white or blue even after loosening your bandage or air cast. Summary  Achilles tendinitis is inflammation of the tough, cord-like band that attaches the lower leg muscles to the heel bone (Achilles tendon).  This condition is usually caused by overusing the tendon and the ankle joint. It can also be caused by arthritis or normal aging.  The most common symptoms of this condition include pain, swelling, or stiffness in the Achilles tendon or in the back of the leg.  This condition is usually treated by decreasing or stopping activities that caused the tendinitis, icing the injured area, taking NSAIDs, and doing physical therapy. This information is not intended to replace advice given to you by your health care provider. Make sure you discuss any questions you have with your health care provider. Document Revised: 10/22/2018 Document Reviewed:  10/22/2018 Elsevier Patient Education  [place].

## 2020-08-21 NOTE — Progress Notes (Signed)
Patient ID: Brandi Terrell, female    DOB: January 14, 1966, 55 y.o.   MRN: [id number]  This visit was conducted in person.  BP 110/80   Pulse 68   Temp 98.3 F (36.8 C) (Temporal)   Ht 5' 3.75" (1.619 m)   Wt 188 lb 4 oz (85.4 kg)   SpO2 98%   BMI 32.57 kg/m    CC:  Chief Complaint  Patient presents with  . Urinary Frequency  . Polydipsia  . Foot Pain    Left  . Blurred Vision  . Headache    Mostly in the morning over left eye    Subjective:   HPI: Brandi Terrell is a 55 y.o. female presenting on 08/21/2020 for Urinary Frequency, Polydipsia, Foot Pain (Left), Blurred Vision, and Headache (Mostly in the morning over left eye)  1. Left foot pain,  X 6-8 weeks, pain is intermittent. No known fall, injury.  Pain is gone  In last week.  pain in posterior heel, radiating up ankle over achilles.  In AM worst when putting foot on floor. Hx of plantar fasciitis  2. Increased thirst, increase urination x several weeks, intermitent blurred vision No dysuria.  may have some eye strain after looking at phone. Using readers. Last eye exam  05/2021   Lab Results  Component Value Date   HGBA1C 6.1 04/24/2020   4. Headache in morning over left eye x 2 weeks. No congestion. Occ dry nose, blood in nose.  using zyrtec. BP Readings from Last 3 Encounters:  08/21/20 110/80  05/01/20 126/84  05/14/19 140/88        Relevant past medical, surgical, family and social history reviewed and updated as indicated. Interim medical history since our last visit reviewed. Allergies and medications reviewed and updated. Outpatient Medications Prior to Visit  Medication Sig Dispense Refill  . amLODipine-valsartan (EXFORGE) 10-160 MG tablet TAKE 1 TABLET BY MOUTH EVERY DAY FOR BLOOD PRESSURE 90 tablet 0  . cetirizine (ZYRTEC) 5 MG tablet Take 2.5 mg by mouth daily.    Marland Kitchen estradiol (VIVELLE-DOT) 0.05 MG/24HR patch 1 patch 2 (two) times a week.    . lamoTRIgine (LAMICTAL) 100 MG tablet Take  200 mg by mouth.     . levonorgestrel (MIRENA) 20 MCG/24HR IUD 1 each by Intrauterine route once.    Marland Kitchen liothyronine (CYTOMEL) 25 MCG tablet Take 25 mcg by mouth every morning.    . metoprolol succinate (TOPROL-XL) 100 MG 24 hr tablet TAKE 1 TABLET BY MOUTH EVERY DAY WITH OR IMMEDIATELY FOLLOWING A MEAL 90 tablet 1  . Multiple Vitamin (MULTI-VITAMINS) TABS Take 1 tablet by mouth daily.    Marland Kitchen omeprazole (PRILOSEC) 20 MG capsule Take 10 mg by mouth daily.    Marland Kitchen zolpidem (AMBIEN) 10 MG tablet Take 5 mg by mouth at bedtime as needed for sleep.    Marland Kitchen zolpidem (AMBIEN) 10 MG tablet Take 1 tablet (10 mg total) by mouth at bedtime. (Patient taking differently: Take 5 mg by mouth at bedtime.) 30 tablet 0   No facility-administered medications prior to visit.     Per HPI unless specifically indicated in ROS section below Review of Systems  Constitutional: Negative for fatigue and fever.  HENT: Negative for congestion.   Eyes: Negative for pain.  Respiratory: Negative for cough and shortness of breath.   Cardiovascular: Negative for chest pain, palpitations and leg swelling.  Gastrointestinal: Negative for abdominal pain.  Genitourinary: Negative for dysuria and vaginal bleeding.  Musculoskeletal: Negative  for back pain.  Neurological: Positive for headaches. Negative for syncope and light-headedness.  Psychiatric/Behavioral: Negative for dysphoric mood.   Objective:  BP 110/80   Pulse 68   Temp 98.3 F (36.8 C) (Temporal)   Ht 5' 3.75" (1.619 m)   Wt 188 lb 4 oz (85.4 kg)   SpO2 98%   BMI 32.57 kg/m   Wt Readings from Last 3 Encounters:  08/21/20 188 lb 4 oz (85.4 kg)  05/01/20 190 lb (86.2 kg)  05/14/19 195 lb 8 oz (88.7 kg)      Physical Exam Constitutional:      General: She is not in acute distress.Vital signs are normal.     Appearance: Normal appearance. She is well-developed and well-nourished. She is not ill-appearing or toxic-appearing.  HENT:     Head: Normocephalic.      Right Ear: Hearing, tympanic membrane, ear canal and external ear normal. Tympanic membrane is not erythematous, retracted or bulging.     Left Ear: Hearing, tympanic membrane, ear canal and external ear normal. Tympanic membrane is not erythematous, retracted or bulging.     Nose: No mucosal edema or rhinorrhea.     Right Sinus: No maxillary sinus tenderness or frontal sinus tenderness.     Left Sinus: No maxillary sinus tenderness or frontal sinus tenderness.     Mouth/Throat:     Mouth: Oropharynx is clear and moist and mucous membranes are normal.     Pharynx: Uvula midline.  Eyes:     General: Lids are normal. Lids are everted, no foreign bodies appreciated.     Extraocular Movements: EOM normal.     Conjunctiva/sclera: Conjunctivae normal.     Pupils: Pupils are equal, round, and reactive to light.  Neck:     Thyroid: No thyroid mass or thyromegaly.     Vascular: No carotid bruit.     Trachea: Trachea normal.  Cardiovascular:     Rate and Rhythm: Normal rate and regular rhythm.     Pulses: Normal pulses and intact distal pulses.     Heart sounds: Normal heart sounds, S1 normal and S2 normal. No murmur heard. No friction rub. No gallop.   Pulmonary:     Effort: Pulmonary effort is normal. No tachypnea or respiratory distress.     Breath sounds: Normal breath sounds. No decreased breath sounds, wheezing, rhonchi or rales.  Abdominal:     General: Bowel sounds are normal.     Palpations: Abdomen is soft.     Tenderness: There is no abdominal tenderness.  Musculoskeletal:     Cervical back: Normal range of motion and neck supple.     Comments: ttp over insertion of achilles tendon. othwise normal foot and ankle exam bilaterally.  Skin:    General: Skin is warm, dry and intact.     Findings: No rash.  Neurological:     Mental Status: She is alert.  Psychiatric:        Mood and Affect: Mood is not anxious or depressed.        Speech: Speech normal.        Behavior: Behavior  normal. Behavior is cooperative.        Thought Content: Thought content normal.        Cognition and Memory: Cognition and memory normal.        Judgment: Judgment normal.       Results for orders placed or performed in visit on 04/24/20  Hemoglobin A1c  Result Value Ref Range  Hgb A1c MFr Bld 6.1 4.6 - 6.5 %  Lipid panel  Result Value Ref Range   Cholesterol 186 0 - 200 mg/dL   Triglycerides 154.0 (H) 0.0 - 149.0 mg/dL   HDL 43.60 >39.00 mg/dL   VLDL 30.8 0.0 - 40.0 mg/dL   LDL Cholesterol 112 (H) 0 - 99 mg/dL   Total CHOL/HDL Ratio 4    NonHDL 142.62   Comprehensive metabolic panel  Result Value Ref Range   Sodium 140 135 - 145 mEq/L   Potassium 4.0 3.5 - 5.1 mEq/L   Chloride 104 96 - 112 mEq/L   CO2 28 19 - 32 mEq/L   Glucose, Bld 100 (H) 70 - 99 mg/dL   BUN 18 6 - 23 mg/dL   Creatinine, Ser 0.65 0.40 - 1.20 mg/dL   Total Bilirubin 0.6 0.2 - 1.2 mg/dL   Alkaline Phosphatase 83 39 - 117 U/L   AST 19 0 - 37 U/L   ALT 19 0 - 35 U/L   Total Protein 7.2 6.0 - 8.3 g/dL   Albumin 4.4 3.5 - 5.2 g/dL   GFR 98.26 >60.00 mL/min   Calcium 10.3 8.4 - 10.5 mg/dL  Vitamin B12  Result Value Ref Range   Vitamin B-12 556 211 - 911 pg/mL    This visit occurred during the SARS-CoV-2 public health emergency.  Safety protocols were in place, including screening questions prior to the visit, additional usage of staff PPE, and extensive cleaning of exam room while observing appropriate contact time as indicated for disinfecting solutions.   COVID 19 screen:  No recent travel or known exposure to COVID19 The patient denies respiratory symptoms of COVID 19 at this time. The importance of social distancing was discussed today.   Assessment and Plan    Problem List Items Addressed This Visit    Hypertension (Chronic)     Good control.. not causing headache or blurred vision      Increased thirst   Relevant Orders   HgB A1c (Completed)   POCT glucose (manual entry) (Completed)    Left Achilles tendinitis    Treat with topical NSAID, ice and home PT.. if not improving follow up.      Prediabetes (Chronic)    Improving control, not a clear cause of her increased thirst.      Urinary frequency - Primary    No sign on UA of UTI.  No glucose.      Relevant Orders   POCT Urinalysis Dipstick (Automated) (Completed)   POCT glucose (manual entry) (Completed)      Eliezer Lofts, MD

## 2020-08-30 ENCOUNTER — Other Ambulatory Visit: Payer: Self-pay | Admitting: Family Medicine

## 2020-09-03 ENCOUNTER — Telehealth: Payer: Self-pay

## 2020-09-03 NOTE — Telephone Encounter (Signed)
Use diclofenac gel is OTC.

## 2020-09-03 NOTE — Telephone Encounter (Signed)
Pt left v/m that she had checked with her pharmacy and no topical ointment had been called in for achilles tendon; per 08/21/20 office note there was mention of voltaren gel which I think is OTC. Pt request cb about topical ointment. I spoke with pt and advised per office note on 08/21/20 that Dr Diona Browner wanted pt to try voltaren gel which is OTC and apply to heel 3 - 4 times daily, start home PT for achilles tendonitis; can use ice as well and if not improving FU with Dr Diona Browner. Pt said she thinks the heel is better but wanted to get the medication in case needs it. Pt voiced understanding of med and instructions on how to use med and will cb if needed. Will send note to Dr Diona Browner as FYI heel is better at this time.

## 2020-09-14 DIAGNOSIS — F3341 Major depressive disorder, recurrent, in partial remission: Secondary | ICD-10-CM | POA: Diagnosis not present

## 2020-10-27 DIAGNOSIS — H40003 Preglaucoma, unspecified, bilateral: Secondary | ICD-10-CM | POA: Diagnosis not present

## 2020-10-30 DIAGNOSIS — H40003 Preglaucoma, unspecified, bilateral: Secondary | ICD-10-CM | POA: Diagnosis not present

## 2020-11-30 ENCOUNTER — Other Ambulatory Visit: Payer: Self-pay | Admitting: Family Medicine

## 2020-12-16 DIAGNOSIS — Z808 Family history of malignant neoplasm of other organs or systems: Secondary | ICD-10-CM | POA: Diagnosis not present

## 2020-12-16 DIAGNOSIS — Z79899 Other long term (current) drug therapy: Secondary | ICD-10-CM | POA: Diagnosis not present

## 2020-12-16 DIAGNOSIS — Z01419 Encounter for gynecological examination (general) (routine) without abnormal findings: Secondary | ICD-10-CM | POA: Diagnosis not present

## 2020-12-16 DIAGNOSIS — Z1151 Encounter for screening for human papillomavirus (HPV): Secondary | ICD-10-CM | POA: Diagnosis not present

## 2020-12-16 DIAGNOSIS — Z800 Family history of malignant neoplasm of digestive organs: Secondary | ICD-10-CM | POA: Diagnosis not present

## 2020-12-16 DIAGNOSIS — Z780 Asymptomatic menopausal state: Secondary | ICD-10-CM | POA: Diagnosis not present

## 2020-12-16 DIAGNOSIS — N3941 Urge incontinence: Secondary | ICD-10-CM | POA: Diagnosis not present

## 2020-12-16 DIAGNOSIS — N841 Polyp of cervix uteri: Secondary | ICD-10-CM | POA: Diagnosis not present

## 2020-12-16 DIAGNOSIS — Z124 Encounter for screening for malignant neoplasm of cervix: Secondary | ICD-10-CM | POA: Diagnosis not present

## 2020-12-16 DIAGNOSIS — Z975 Presence of (intrauterine) contraceptive device: Secondary | ICD-10-CM | POA: Diagnosis not present

## 2020-12-16 LAB — RESULTS CONSOLE HPV: CHL HPV: NEGATIVE

## 2020-12-16 LAB — HM PAP SMEAR

## 2020-12-29 DIAGNOSIS — F3341 Major depressive disorder, recurrent, in partial remission: Secondary | ICD-10-CM | POA: Diagnosis not present

## 2020-12-30 DIAGNOSIS — D225 Melanocytic nevi of trunk: Secondary | ICD-10-CM | POA: Diagnosis not present

## 2020-12-30 DIAGNOSIS — Z85828 Personal history of other malignant neoplasm of skin: Secondary | ICD-10-CM | POA: Diagnosis not present

## 2020-12-30 DIAGNOSIS — D2272 Melanocytic nevi of left lower limb, including hip: Secondary | ICD-10-CM | POA: Diagnosis not present

## 2020-12-30 DIAGNOSIS — Z08 Encounter for follow-up examination after completed treatment for malignant neoplasm: Secondary | ICD-10-CM | POA: Diagnosis not present

## 2021-01-01 ENCOUNTER — Ambulatory Visit: Payer: BC Managed Care – PPO | Admitting: Family Medicine

## 2021-01-01 ENCOUNTER — Encounter: Payer: Self-pay | Admitting: Family Medicine

## 2021-01-01 ENCOUNTER — Other Ambulatory Visit: Payer: Self-pay

## 2021-01-01 VITALS — BP 128/84 | HR 64 | Temp 97.8°F | Ht 63.75 in | Wt 171.0 lb

## 2021-01-01 DIAGNOSIS — K5900 Constipation, unspecified: Secondary | ICD-10-CM | POA: Insufficient documentation

## 2021-01-01 DIAGNOSIS — R7303 Prediabetes: Secondary | ICD-10-CM

## 2021-01-01 DIAGNOSIS — I10 Essential (primary) hypertension: Secondary | ICD-10-CM

## 2021-01-01 DIAGNOSIS — E7800 Pure hypercholesterolemia, unspecified: Secondary | ICD-10-CM | POA: Diagnosis not present

## 2021-01-01 LAB — COMPREHENSIVE METABOLIC PANEL
ALT: 38 U/L — ABNORMAL HIGH (ref 0–35)
AST: 31 U/L (ref 0–37)
Albumin: 4.7 g/dL (ref 3.5–5.2)
Alkaline Phosphatase: 100 U/L (ref 39–117)
BUN: 21 mg/dL (ref 6–23)
CO2: 29 mEq/L (ref 19–32)
Calcium: 10.6 mg/dL — ABNORMAL HIGH (ref 8.4–10.5)
Chloride: 103 mEq/L (ref 96–112)
Creatinine, Ser: 0.71 mg/dL (ref 0.40–1.20)
GFR: 94.43 mL/min (ref >60.00)
Glucose, Bld: 98 mg/dL (ref 70–99)
Potassium: 4.1 mEq/L (ref 3.5–5.1)
Sodium: 139 mEq/L (ref 135–145)
Total Bilirubin: 0.6 mg/dL (ref 0.2–1.2)
Total Protein: 7.6 g/dL (ref 6.0–8.3)

## 2021-01-01 LAB — LIPID PANEL
Cholesterol: 163 mg/dL (ref 0–200)
HDL: 40 mg/dL (ref >39.00)
LDL Cholesterol: 105 mg/dL — ABNORMAL HIGH (ref 0–99)
NonHDL: 123.45
Total CHOL/HDL Ratio: 4
Triglycerides: 94 mg/dL (ref 0.0–149.0)
VLDL: 18.8 mg/dL (ref 0.0–40.0)

## 2021-01-01 LAB — HEMOGLOBIN A1C: Hgb A1c MFr Bld: 5.7 % (ref 4.6–6.5)

## 2021-01-01 NOTE — Assessment & Plan Note (Signed)
Due for re-eval on keto diet  Lab Results  Component Value Date   HGBA1C 5.5 08/21/2020

## 2021-01-01 NOTE — Assessment & Plan Note (Signed)
Due for re-eval on keto diet.

## 2021-01-01 NOTE — Assessment & Plan Note (Signed)
Stable, chronic.  Continue current medication.   As weight comes off, if BP decreases we will consider stopping BBlocker as she has mild associated SE.  She was not started on it for and HR or migraine issues. Has possible SE to HCTZ in past.

## 2021-01-01 NOTE — Patient Instructions (Addendum)
Please stop at the lab to have labs drawn. Start daily insoluble fiber daily like Metamucil. Keep up with water intake.  Keep appt as schedule.

## 2021-01-01 NOTE — Progress Notes (Signed)
Patient ID: Brandi Terrell, female    DOB: [birth year], 55 y.o.   MRN: [id number]  This visit was conducted in person.  BP 128/84   Pulse 64   Temp 97.8 F (36.6 C) (Temporal)   Ht 5' 3.75" (1.619 m)   Wt 171 lb (77.6 kg)   SpO2 97%   BMI 29.58 kg/m    CC: Subjective:   HPI: Brandi Terrell is a 55 y.o. female presenting on 01/01/2021 for Weight Loss and bloodwork  Weight management: She reports in last  11 week she has been working on [employer]... overall she has lost 21 lbs. She has eliminated carbs almost entirely. Has stopped soda.   She has noted increase in constipation on keto diet.. She has been using  stool softner daily.Marland Kitchen 1-2 times a week.  Recently started on Myrbetriq for OAB.. reviewed GYN note.  BP Readings from Last 3 Encounters:  01/01/21 128/84  08/21/20 110/80  05/01/20 126/84    Wt Readings from Last 3 Encounters:  01/01/21 171 lb (77.6 kg)  08/21/20 188 lb 4 oz (85.4 kg)  05/01/20 190 lb (86.2 kg)   Body mass index is 29.58 kg/m.  Hypertension:   At goal on amlodipine, valsartan as well as metoprolol  She is interested in coming off BBlocker given what she has read of it as well as some fatigue associated.  Using medication without problems or lightheadedness:  no lightheadedness. Chest pain with exertion:none Edema:none Short of breath: none Average home BPs: Other issues:      Relevant past medical, surgical, family and social history reviewed and updated as indicated. Interim medical history since our last visit reviewed. Allergies and medications reviewed and updated. Outpatient Medications Prior to Visit  Medication Sig Dispense Refill   amLODipine-valsartan (EXFORGE) 10-160 MG tablet TAKE 1 TABLET BY MOUTH EVERY DAY FOR BLOOD PRESSURE 90 tablet 1   cetirizine (ZYRTEC) 5 MG tablet Take 2.5 mg by mouth daily.     DENTA 5000 PLUS 1.1 % CREA dental cream      estradiol (VIVELLE-DOT) 0.025 MG/24HR Place onto the skin.      lamoTRIgine (LAMICTAL) 100 MG tablet Take 200 mg by mouth.      levonorgestrel (MIRENA) 20 MCG/24HR IUD 1 each by Intrauterine route once.     liothyronine (CYTOMEL) 25 MCG tablet Take 25 mcg by mouth every morning.     metoprolol succinate (TOPROL-XL) 100 MG 24 hr tablet TAKE 1 TABLET BY MOUTH EVERY DAY WITH OR IMMEDIATELY FOLLOWING A MEAL 90 tablet 1   Multiple Vitamin (MULTI-VITAMINS) TABS Take 1 tablet by mouth daily.     MYRBETRIQ 25 MG TB24 tablet Take 25 mg by mouth daily.     omeprazole (PRILOSEC) 20 MG capsule Take 10 mg by mouth daily.     zolpidem (AMBIEN) 10 MG tablet Take 5 mg by mouth at bedtime as needed for sleep.     estradiol (VIVELLE-DOT) 0.05 MG/24HR patch 1 patch 2 (two) times a week.     No facility-administered medications prior to visit.     Per HPI unless specifically indicated in ROS section below Review of Systems  Constitutional:  Negative for fatigue and fever.  HENT:  Negative for congestion.   Eyes:  Negative for pain.  Respiratory:  Negative for cough and shortness of breath.   Cardiovascular:  Negative for chest pain, palpitations and leg swelling.  Gastrointestinal:  Negative for abdominal pain.  Genitourinary:  Negative for dysuria and  vaginal bleeding.  Musculoskeletal:  Negative for back pain.  Neurological:  Negative for syncope, light-headedness and headaches.  Psychiatric/Behavioral:  Negative for dysphoric mood.   Objective:  BP 128/84   Pulse 64   Temp 97.8 F (36.6 C) (Temporal)   Ht 5' 3.75" (1.619 m)   Wt 171 lb (77.6 kg)   SpO2 97%   BMI 29.58 kg/m   Wt Readings from Last 3 Encounters:  01/01/21 171 lb (77.6 kg)  08/21/20 188 lb 4 oz (85.4 kg)  05/01/20 190 lb (86.2 kg)      Physical Exam    Results for orders placed or performed in visit on 08/21/20  POCT Urinalysis Dipstick (Automated)  Result Value Ref Range   Color, UA Yellow    Clarity, UA Clear    Glucose, UA Negative Negative   Bilirubin, UA Negative    Ketones, UA  Negative    Spec Grav, UA 1.025 1.010 - 1.025   Blood, UA Negative    pH, UA 5.5 5.0 - 8.0   Protein, UA Positive (A) Negative   Urobilinogen, UA 0.2 0.2 or 1.0 E.U./dL   Nitrite, UA Negative    Leukocytes, UA Negative Negative  HgB A1c  Result Value Ref Range   Hemoglobin A1C 5.5 4.0 - 5.6 %   HbA1c POC (<> result, manual entry)     HbA1c, POC (prediabetic range)     HbA1c, POC (controlled diabetic range)    POCT glucose (manual entry)  Result Value Ref Range   POC Glucose 104 (A) 70 - 99 mg/dl    This visit occurred during the SARS-CoV-2 public health emergency.  Safety protocols were in place, including screening questions prior to the visit, additional usage of staff PPE, and extensive cleaning of exam room while observing appropriate contact time as indicated for disinfecting solutions.   COVID 19 screen:  No recent travel or known exposure to COVID19 The patient denies respiratory symptoms of COVID 19 at this time. The importance of social distancing was discussed today.   Assessment and Plan    Problem List Items Addressed This Visit     Hypercholesterolemia (Chronic)    Due for re-eval on keto diet.       Relevant Orders   Lipid panel   Comprehensive metabolic panel   Hypertension (Chronic)    Stable, chronic.  Continue current medication.   As weight comes off, if BP decreases we will consider stopping BBlocker as she has mild associated SE.  She was not started on it for and HR or migraine issues. Has possible SE to HCTZ in past.       Prediabetes - Primary (Chronic)    Due for re-eval on keto diet  Lab Results  Component Value Date   HGBA1C 5.5 08/21/2020        Relevant Orders   Hemoglobin A1c   Constipation    Likely due to diet change and increase protein decreased fiber.   Start daily fiber supplement, continue water intake.        Orders Placed This Encounter  Procedures   Hemoglobin A1c   Lipid panel   Comprehensive metabolic  panel     Eliezer Lofts, MD

## 2021-01-01 NOTE — Assessment & Plan Note (Signed)
Likely due to diet change and increase protein decreased fiber.   Start daily fiber supplement, continue water intake.

## 2021-01-07 DIAGNOSIS — E669 Obesity, unspecified: Secondary | ICD-10-CM | POA: Diagnosis not present

## 2021-01-07 DIAGNOSIS — I10 Essential (primary) hypertension: Secondary | ICD-10-CM | POA: Diagnosis not present

## 2021-01-07 DIAGNOSIS — Z713 Dietary counseling and surveillance: Secondary | ICD-10-CM | POA: Diagnosis not present

## 2021-01-08 DIAGNOSIS — N841 Polyp of cervix uteri: Secondary | ICD-10-CM | POA: Diagnosis not present

## 2021-01-12 DIAGNOSIS — E669 Obesity, unspecified: Secondary | ICD-10-CM | POA: Diagnosis not present

## 2021-01-12 DIAGNOSIS — Z713 Dietary counseling and surveillance: Secondary | ICD-10-CM | POA: Diagnosis not present

## 2021-01-12 DIAGNOSIS — I10 Essential (primary) hypertension: Secondary | ICD-10-CM | POA: Diagnosis not present

## 2021-01-21 DIAGNOSIS — G4733 Obstructive sleep apnea (adult) (pediatric): Secondary | ICD-10-CM | POA: Diagnosis not present

## 2021-01-21 DIAGNOSIS — E669 Obesity, unspecified: Secondary | ICD-10-CM | POA: Diagnosis not present

## 2021-01-21 DIAGNOSIS — F32A Depression, unspecified: Secondary | ICD-10-CM | POA: Diagnosis not present

## 2021-01-21 DIAGNOSIS — I10 Essential (primary) hypertension: Secondary | ICD-10-CM | POA: Diagnosis not present

## 2021-01-26 DIAGNOSIS — Z713 Dietary counseling and surveillance: Secondary | ICD-10-CM | POA: Diagnosis not present

## 2021-01-26 DIAGNOSIS — I10 Essential (primary) hypertension: Secondary | ICD-10-CM | POA: Diagnosis not present

## 2021-01-26 DIAGNOSIS — E669 Obesity, unspecified: Secondary | ICD-10-CM | POA: Diagnosis not present

## 2021-01-27 DIAGNOSIS — N3941 Urge incontinence: Secondary | ICD-10-CM | POA: Diagnosis not present

## 2021-01-27 DIAGNOSIS — N951 Menopausal and female climacteric states: Secondary | ICD-10-CM | POA: Diagnosis not present

## 2021-02-02 DIAGNOSIS — E669 Obesity, unspecified: Secondary | ICD-10-CM | POA: Diagnosis not present

## 2021-02-02 DIAGNOSIS — Z713 Dietary counseling and surveillance: Secondary | ICD-10-CM | POA: Diagnosis not present

## 2021-02-02 DIAGNOSIS — I10 Essential (primary) hypertension: Secondary | ICD-10-CM | POA: Diagnosis not present

## 2021-02-03 DIAGNOSIS — I10 Essential (primary) hypertension: Secondary | ICD-10-CM | POA: Diagnosis not present

## 2021-02-03 DIAGNOSIS — E2839 Other primary ovarian failure: Secondary | ICD-10-CM | POA: Diagnosis not present

## 2021-02-03 DIAGNOSIS — F32A Depression, unspecified: Secondary | ICD-10-CM | POA: Diagnosis not present

## 2021-02-03 DIAGNOSIS — G4733 Obstructive sleep apnea (adult) (pediatric): Secondary | ICD-10-CM | POA: Diagnosis not present

## 2021-02-08 ENCOUNTER — Other Ambulatory Visit: Payer: Self-pay | Admitting: Family Medicine

## 2021-02-16 ENCOUNTER — Other Ambulatory Visit: Payer: Self-pay

## 2021-02-16 ENCOUNTER — Telehealth: Payer: BC Managed Care – PPO | Admitting: Family Medicine

## 2021-02-16 ENCOUNTER — Encounter: Payer: Self-pay | Admitting: Family Medicine

## 2021-02-16 VITALS — HR 91 | Temp 99.5°F | Wt 160.0 lb

## 2021-02-16 DIAGNOSIS — U071 COVID-19: Secondary | ICD-10-CM

## 2021-02-16 DIAGNOSIS — R059 Cough, unspecified: Secondary | ICD-10-CM | POA: Diagnosis not present

## 2021-02-16 MED ORDER — MOLNUPIRAVIR EUA 200MG CAPSULE: 4.0000 | ORAL_CAPSULE | Freq: Two times a day (BID) | ORAL | 0 refills | Status: AC

## 2021-02-16 MED ORDER — BENZONATATE 100 MG PO CAPS: 100.0000 mg | ORAL_CAPSULE | Freq: Three times a day (TID) | ORAL | 0 refills | Status: DC | PRN

## 2021-02-16 MED ORDER — HYDROCODONE BIT-HOMATROP MBR 5-1.5 MG/5ML PO SOLN: 5.0000 mL | Freq: Three times a day (TID) | ORAL | 0 refills | Status: DC | PRN

## 2021-02-16 NOTE — Progress Notes (Signed)
I connected with Brandi Terrell on 02/16/21 at 12:00 PM EDT by video and verified that I am speaking with the correct person using two identifiers.   I discussed the limitations, risks, security and privacy concerns of performing an evaluation and management service by video and the availability of in person appointments. I also discussed with the patient that there may be a patient responsible charge related to this service. The patient expressed understanding and agreed to proceed.  Patient location: Home Provider Location: [place] Participants: Brandi Terrell and Brandi Terrell   Subjective:     Brandi Terrell is a 55 y.o. female presenting for Covid Positive (Onset of sx: 02/15/21), Sore Throat, Nasal Congestion, Cough (dry), and Fever (99.5 oral)     Sore Throat  Associated symptoms include congestion and coughing. Pertinent negatives include no diarrhea, shortness of breath or vomiting.  Cough Associated symptoms include a fever, myalgias and a sore throat. Pertinent negatives include no chest pain, chills or shortness of breath.  Fever  Associated symptoms include congestion, coughing and a sore throat. Pertinent negatives include no chest pain, diarrhea, nausea or vomiting.   #Covid positive - 02/15/2021 - has taken tylenol for fever - itchy ears and stuffy nose   Review of Systems  Constitutional:  Positive for fever. Negative for chills.  HENT:  Positive for congestion, sinus pressure and sore throat. Negative for sinus pain.   Respiratory:  Positive for cough. Negative for shortness of breath.   Cardiovascular:  Negative for chest pain.  Gastrointestinal:  Negative for diarrhea, nausea and vomiting.  Musculoskeletal:  Positive for arthralgias and myalgias.    Social History   Tobacco Use  Smoking Status Never  Smokeless Tobacco Never        Objective:   BP Readings from Last 3 Encounters:  01/01/21 128/84  08/21/20 110/80  05/01/20  126/84   Wt Readings from Last 3 Encounters:  02/16/21 160 lb (72.6 kg)  01/01/21 171 lb (77.6 kg)  08/21/20 188 lb 4 oz (85.4 kg)    Pulse 91   Temp 99.5 F (37.5 C) (Oral)   Wt 160 lb (72.6 kg)   SpO2 98%   BMI 27.68 kg/m   Physical Exam Constitutional:      Appearance: Normal appearance. She is not ill-appearing.  HENT:     Head: Normocephalic and atraumatic.     Right Ear: External ear normal.     Left Ear: External ear normal.  Eyes:     Conjunctiva/sclera: Conjunctivae normal.  Pulmonary:     Effort: Pulmonary effort is normal. No respiratory distress.  Neurological:     Mental Status: She is alert. Mental status is at baseline.  Psychiatric:        Mood and Affect: Mood normal.        Behavior: Behavior normal.        Thought Content: Thought content normal.        Judgment: Judgment normal.          Assessment & Plan:   Problem List Items Addressed This Visit   None Visit Diagnoses     COVID-19 virus infection    -  Primary   Relevant Medications   molnupiravir EUA 200 mg CAPS   benzonatate (TESSALON PERLES) 100 MG capsule   Cough       Relevant Medications   HYDROcodone bit-homatropine (HYCODAN) 5-1.5 MG/5ML syrup      Patient is at increased risk  for developing severe covid due to HTN. They are eligible for anti-viral medication.   Drug interaction with paxlovid.   Will do Molnupiravir which was sent to pharmacy  Lab Results  Component Value Date   ALT 38 (H) 01/01/2021   AST 31 01/01/2021   ALKPHOS 100 01/01/2021   BILITOT 0.6 01/01/2021    Lab Results  Component Value Date   CREATININE 0.71 01/01/2021     Medication sent to pharmacy  Reviewed ER and return precautions  Reviewed isolation guidelines.    Return if symptoms worsen or fail to improve.  Brandi Noe, MD

## 2021-02-17 ENCOUNTER — Emergency Department
Admission: EM | Admit: 2021-02-17 | Discharge: 2021-02-17 | Disposition: A | Discharge status: Home / Self Care | Payer: BC Managed Care – PPO | Attending: Emergency Medicine | Admitting: Emergency Medicine

## 2021-02-17 ENCOUNTER — Other Ambulatory Visit: Payer: Self-pay

## 2021-02-17 ENCOUNTER — Encounter: Payer: Self-pay | Admitting: *Deleted

## 2021-02-17 ENCOUNTER — Emergency Department: Payer: BC Managed Care – PPO

## 2021-02-17 DIAGNOSIS — R110 Nausea: Secondary | ICD-10-CM | POA: Insufficient documentation

## 2021-02-17 DIAGNOSIS — Z79899 Other long term (current) drug therapy: Secondary | ICD-10-CM | POA: Diagnosis not present

## 2021-02-17 DIAGNOSIS — Z85820 Personal history of malignant melanoma of skin: Secondary | ICD-10-CM | POA: Diagnosis not present

## 2021-02-17 DIAGNOSIS — R1013 Epigastric pain: Secondary | ICD-10-CM | POA: Diagnosis not present

## 2021-02-17 DIAGNOSIS — R112 Nausea with vomiting, unspecified: Secondary | ICD-10-CM | POA: Diagnosis not present

## 2021-02-17 DIAGNOSIS — I10 Essential (primary) hypertension: Secondary | ICD-10-CM | POA: Diagnosis not present

## 2021-02-17 DIAGNOSIS — U071 COVID-19: Secondary | ICD-10-CM | POA: Insufficient documentation

## 2021-02-17 DIAGNOSIS — R5383 Other fatigue: Secondary | ICD-10-CM | POA: Diagnosis not present

## 2021-02-17 DIAGNOSIS — R079 Chest pain, unspecified: Secondary | ICD-10-CM | POA: Diagnosis not present

## 2021-02-17 LAB — CBC
HCT: 37.6 % (ref 36.0–46.0)
Hemoglobin: 12.3 g/dL (ref 12.0–15.0)
MCH: 28.2 pg (ref 26.0–34.0)
MCHC: 32.7 g/dL (ref 30.0–36.0)
MCV: 86.2 fL (ref 80.0–100.0)
Platelets: 265 10*3/uL (ref 150–400)
RBC: 4.36 MIL/uL (ref 3.87–5.11)
RDW: 13.3 % (ref 11.5–15.5)
WBC: 5.9 10*3/uL (ref 4.0–10.5)
nRBC: 0 % (ref 0.0–0.2)

## 2021-02-17 LAB — BASIC METABOLIC PANEL
Anion gap: 8 (ref 5–15)
BUN: 17 mg/dL (ref 6–20)
CO2: 28 mmol/L (ref 22–32)
Calcium: 9.8 mg/dL (ref 8.9–10.3)
Chloride: 103 mmol/L (ref 98–111)
Creatinine, Ser: 0.75 mg/dL (ref 0.44–1.00)
GFR, Estimated: >60 mL/min (ref >60)
Glucose, Bld: 178 mg/dL — ABNORMAL HIGH (ref 70–99)
Potassium: 3.8 mmol/L (ref 3.5–5.1)
Sodium: 139 mmol/L (ref 135–145)

## 2021-02-17 LAB — RESP PANEL BY RT-PCR (FLU A&B, COVID) ARPGX2
Influenza A by PCR: NEGATIVE
Influenza B by PCR: NEGATIVE
SARS Coronavirus 2 by RT PCR: POSITIVE — AB

## 2021-02-17 LAB — TROPONIN I (HIGH SENSITIVITY): Troponin I (High Sensitivity): 3 ng/L (ref <18)

## 2021-02-17 MED ORDER — ONDANSETRON 4 MG PO TBDP: 4.0000 mg | ORAL_TABLET | Freq: Three times a day (TID) | ORAL | 0 refills | Status: DC | PRN

## 2021-02-17 NOTE — Discharge Instructions (Addendum)
Please start taking your home medication for GERD for the next 7 days

## 2021-02-17 NOTE — ED Triage Notes (Signed)
Pt tested positive for covid yesterday.  Tonight pt has chest pain and feels anxious.  Denies sob.  Pt has a cough.  Nonsmoker.  Pt reports nausea.  Pt alert

## 2021-02-17 NOTE — ED Notes (Signed)
E-signature pad unavailable - Pt verbalized understanding of D/C information - no additional concerns at this time.  

## 2021-02-17 NOTE — ED Provider Notes (Addendum)
[facility] Emergency Department Provider Note   ____________________________________________   Event Date/Time   First MD Initiated Contact with Patient 02/17/21 0107     (approximate)  I have reviewed the triage vital signs and the nursing notes.   HISTORY  Chief Complaint covid exposre    HPI Brandi Terrell is a 55 y.o. female who presents for epigastric pain in setting of COVID infection  LOCATION: Epigastric DURATION: 2 days TIMING: Worsening since onset SEVERITY: 6/10 QUALITY: Burning pain CONTEXT: Patient states that she was recently exposed to [place] and had a telehealth visit yesterday where she was diagnosed empirically and placed on mulnupiravir.  Since she was first diagnosed she states that she has had worsening epigastric abdominal pain MODIFYING FACTORS: Denies any exacerbating or relieving factors ASSOCIATED SYMPTOMS: General fatigue, muscle soreness, and nausea   Per medical record review patient has history of GERD          Past Medical History:  Diagnosis Date   Acid reflux    Basal cell carcinoma (BCC) of face    Depression    Hypertension    Inflammatory polyps of colon ([place])    Sleep apnea     Patient Active Problem List   Diagnosis Date Noted   Constipation 01/01/2021   Left Achilles tendinitis 08/21/2020   Urinary frequency 08/21/2020   Increased thirst 08/21/2020   Hypercholesterolemia 04/26/2019   Situational mixed anxiety and depressive disorder 03/01/2019   History of basal cell cancer 01/26/2017   GAD (generalized anxiety disorder) 01/26/2017   Chronic insomnia 01/26/2017   Hypertension 01/26/2017   Chronic GERD 01/26/2017   Family history of colon cancer 01/26/2017   Prediabetes 01/26/2017   Perineal pain in female, chronic 01/26/2017   Moderate recurrent major depression ([place]) 11/03/2016   B12 deficiency 10/24/2016   Obstructive sleep apnea 11/02/2015   H/O adenomatous polyp of colon  11/02/2015   Basal cell carcinoma of skin of other parts of face 06/24/2014   Relaxation of external anal sphincter 06/24/2014    Past Surgical History:  Procedure Laterality Date   APPENDECTOMY  1976   CHOLECYSTECTOMY  2005    Prior to Admission medications   Medication Sig Start Date End Date Taking? Authorizing Provider  ondansetron (ZOFRAN ODT) 4 MG disintegrating tablet Take 1 tablet (4 mg total) by mouth every 8 (eight) hours as needed for nausea or vomiting. 02/17/21  Yes , Vista Lawman, MD  amLODipine-valsartan (EXFORGE) 10-160 MG tablet TAKE 1 TABLET BY MOUTH EVERY DAY FOR BLOOD PRESSURE 02/08/21   Bedsole, Amy E, MD  benzonatate (TESSALON PERLES) 100 MG capsule Take 1 capsule (100 mg total) by mouth 3 (three) times daily as needed. 02/16/21   Lesleigh Noe, MD  cetirizine (ZYRTEC) 5 MG tablet Take 2.5 mg by mouth daily.    [provider]  DENTA 5000 PLUS 1.1 % CREA dental cream  10/30/20   [provider]  estradiol (VIVELLE-DOT) 0.05 MG/24HR patch 1 patch 2 (two) times a week. 02/10/21   [provider]  HYDROcodone bit-homatropine (HYCODAN) 5-1.5 MG/5ML syrup Take 5 mLs by mouth every 8 (eight) hours as needed for cough. 02/16/21   Lesleigh Noe, MD  lamoTRIgine (LAMICTAL) 100 MG tablet Take 200 mg by mouth.     [provider]  levonorgestrel (MIRENA) 20 MCG/24HR IUD 1 each by Intrauterine route once.    [provider]  liothyronine (CYTOMEL) 25 MCG tablet Take 25 mcg by mouth every morning.  02/19/20   [provider]  metoprolol succinate (TOPROL-XL) 100 MG 24 hr tablet TAKE 1 TABLET BY MOUTH EVERY DAY WITH OR IMMEDIATELY FOLLOWING A MEAL 11/30/20   Bedsole, Amy E, MD  molnupiravir EUA 200 mg CAPS Take 4 capsules (800 mg total) by mouth 2 (two) times daily for 5 days. 02/16/21 02/21/21  Lesleigh Noe, MD  Multiple Vitamin (MULTI-VITAMINS) TABS Take 1 tablet by mouth daily.    [provider]  MYRBETRIQ 25 MG TB24  tablet Take 25 mg by mouth daily. 12/16/20   [provider]  omeprazole (PRILOSEC) 20 MG capsule Take 10 mg by mouth daily.    [provider]  zolpidem (AMBIEN) 10 MG tablet Take 5 mg by mouth at bedtime as needed for sleep.    [provider]    Allergies Sulfa antibiotics, Levofloxacin, Tramadol, and Cephalosporins  Family History  Problem Relation Age of Onset   Hypertension Mother    Depression Mother    Skin cancer Father    Hypertension Father    Depression Father    Cancer Father 48        colon   Stroke Maternal Grandmother    Stroke Paternal Grandmother    Heart disease Paternal Grandmother    Hypertension Paternal Grandmother    Cancer Maternal Grandfather        leukemia   Stroke Maternal Grandfather    Stroke Paternal Grandfather     Social History Social History   Tobacco Use   Smoking status: Never   Smokeless tobacco: Never  Vaping Use   Vaping Use: Never used  Substance Use Topics   Alcohol use: Yes    Comment: occ   Drug use: No    Review of Systems Constitutional: No fever/chills Eyes: No visual changes. ENT: No sore throat. Cardiovascular: Denies chest pain. Respiratory: Denies shortness of breath. Gastrointestinal: Endorses epigastric abdominal pain and nausea.  No vomiting.  No diarrhea. Genitourinary: Negative for dysuria. Musculoskeletal: Negative for acute arthralgias Skin: Negative for rash. Neurological: Negative for headaches, weakness/numbness/paresthesias in any extremity Psychiatric: Negative for suicidal ideation/homicidal ideation   ____________________________________________   PHYSICAL EXAM:  VITAL SIGNS: ED Triage Vitals  Enc Vitals Group     BP 02/17/21 0054 116/66     Pulse Rate 02/17/21 0054 66     Resp 02/17/21 0054 20     Temp 02/17/21 0054 98.6 F (37 C)     Temp Source 02/17/21 0054 Oral     SpO2 02/17/21 0054 97 %     Weight 02/17/21 0052 160 lb (72.6 kg)     Height 02/17/21  0052 '5\' 4"'$  (1.626 m)     Head Circumference --      Peak Flow --      Pain Score 02/17/21 0051 5     Pain Loc --      Pain Edu? --      Excl. in [place]? --    Constitutional: Alert and oriented. Well appearing and in no acute distress. Eyes: Conjunctivae are normal. PERRL. Head: Atraumatic. Nose: No congestion/rhinnorhea. Mouth/Throat: Mucous membranes are moist. Neck: No stridor Cardiovascular: Grossly normal heart sounds.  Good peripheral circulation. Respiratory: Normal respiratory effort.  No retractions. Gastrointestinal: Soft and nontender. No distention. Musculoskeletal: No obvious deformities Neurologic:  Normal speech and language. No gross focal neurologic deficits are appreciated. Skin:  Skin is warm and dry. No rash noted. Psychiatric: Mood and affect are normal. Speech and behavior are normal.  ____________________________________________  LABS (all labs ordered are listed, but only abnormal results are displayed)  Labs Reviewed  RESP PANEL BY RT-PCR (FLU A&B, COVID) ARPGX2 - Abnormal; Notable for the following components:      Result Value   SARS Coronavirus 2 by RT PCR POSITIVE (*)    All other components within normal limits  BASIC METABOLIC PANEL - Abnormal; Notable for the following components:   Glucose, Bld 178 (*)    All other components within normal limits  CBC  TROPONIN I (HIGH SENSITIVITY)  TROPONIN I (HIGH SENSITIVITY)   ____________________________________________  EKG  ED ECG REPORT I, Naaman Plummer, the attending physician, personally viewed and interpreted this ECG.  Date: 02/17/2021 EKG Time: 5259 Rate: 68 Rhythm: normal sinus rhythm QRS Axis: normal Intervals: normal ST/T Wave abnormalities: normal Narrative Interpretation: no evidence of acute ischemia  ____________________________________________  RADIOLOGY  ED MD interpretation: 2 view chest x-ray shows no evidence of acute abnormalities including no pneumonia, pneumothorax,  or widened mediastinum  Official radiology report(s): DG Chest 2 View  Result Date: 02/17/2021 CLINICAL DATA:  Chest pain. EXAM: CHEST - 2 VIEW COMPARISON:  Chest CT dated 01/21/2016. FINDINGS: No focal consolidation, pleural effusion, or pneumothorax. The cardiac silhouette is within limits. No acute osseous pathology. Scoliosis. Right upper quadrant cholecystectomy clips. IMPRESSION: No active cardiopulmonary disease. Electronically Signed   By: Anner Crete M.D.   On: 02/17/2021 01:13    ____________________________________________   PROCEDURES  Procedure(s) performed (including Critical Care):  .1-3 Lead EKG Interpretation  Date/Time: 02/17/2021 2:53 AM Performed by: Naaman Plummer, MD Authorized by: Naaman Plummer, MD     Interpretation: normal     ECG rate:  71   ECG rate assessment: normal     Rhythm: sinus rhythm     Ectopy: none     Conduction: normal     ____________________________________________   INITIAL IMPRESSION / ASSESSMENT AND PLAN / ED COURSE  As part of my medical decision making, I reviewed the following data within the electronic medical record, if available:  Nursing notes reviewed and incorporated, Labs reviewed, EKG interpreted, Old chart reviewed, Radiograph reviewed and Notes from prior ED visits reviewed and incorporated        Presentation most consistent with Viral Syndrome.  Patient has tested positive for COVID-19. Based on vitals and exam they are nontoxic and stable for discharge.  Given History and Exam I have a lower suspicion for: Emergent CardioPulmonary causes [such as Acute Asthma or COPD Exacerbation, acute Heart Failure or exacerbation, PE, PTX, atypical ACS, PNA]. Emergent Otolaryngeal causes [such as PTA, RPA, [place], Epiglottitis, EBV].  Regarding Emergent Travel or Immunosuppressive related infectious: I have a low suspicion for acute HIV.  Will provide strict return precautions and instructions on  self-isolation/quarantine and anticipatory guidance.      ____________________________________________   FINAL CLINICAL IMPRESSION(S) / ED DIAGNOSES  Final diagnoses:  COVID-19 virus infection  Epigastric pain  Nausea     ED Discharge Orders          Ordered    ondansetron (ZOFRAN ODT) 4 MG disintegrating tablet  Every 8 hours PRN        02/17/21 0245             Note:  This document was prepared using Dragon voice recognition software and may include unintentional dictation errors.    Naaman Plummer, MD 02/17/21 [id number]    Naaman Plummer, MD 02/17/21 [phone]

## 2021-02-23 ENCOUNTER — Encounter: Payer: Self-pay | Admitting: Emergency Medicine

## 2021-02-23 ENCOUNTER — Other Ambulatory Visit: Payer: Self-pay

## 2021-02-23 ENCOUNTER — Emergency Department: Payer: BC Managed Care – PPO

## 2021-02-23 ENCOUNTER — Emergency Department
Admission: EM | Admit: 2021-02-23 | Discharge: 2021-02-23 | Disposition: A | Discharge status: Home / Self Care | Payer: BC Managed Care – PPO | Attending: Emergency Medicine | Admitting: Emergency Medicine

## 2021-02-23 DIAGNOSIS — Z79899 Other long term (current) drug therapy: Secondary | ICD-10-CM | POA: Diagnosis not present

## 2021-02-23 DIAGNOSIS — Z85828 Personal history of other malignant neoplasm of skin: Secondary | ICD-10-CM | POA: Insufficient documentation

## 2021-02-23 DIAGNOSIS — R040 Epistaxis: Secondary | ICD-10-CM | POA: Diagnosis not present

## 2021-02-23 DIAGNOSIS — R042 Hemoptysis: Secondary | ICD-10-CM | POA: Diagnosis not present

## 2021-02-23 DIAGNOSIS — U071 COVID-19: Secondary | ICD-10-CM | POA: Diagnosis not present

## 2021-02-23 DIAGNOSIS — I10 Essential (primary) hypertension: Secondary | ICD-10-CM | POA: Diagnosis not present

## 2021-02-23 DIAGNOSIS — Z9049 Acquired absence of other specified parts of digestive tract: Secondary | ICD-10-CM | POA: Diagnosis not present

## 2021-02-23 DIAGNOSIS — R1013 Epigastric pain: Secondary | ICD-10-CM | POA: Insufficient documentation

## 2021-02-23 DIAGNOSIS — K219 Gastro-esophageal reflux disease without esophagitis: Secondary | ICD-10-CM | POA: Insufficient documentation

## 2021-02-23 DIAGNOSIS — M419 Scoliosis, unspecified: Secondary | ICD-10-CM | POA: Diagnosis not present

## 2021-02-23 LAB — CBC WITH DIFFERENTIAL/PLATELET
Abs Immature Granulocytes: 0.02 10*3/uL (ref 0.00–0.07)
Basophils Absolute: 0 10*3/uL (ref 0.0–0.1)
Basophils Relative: 0 %
Eosinophils Absolute: 0.1 10*3/uL (ref 0.0–0.5)
Eosinophils Relative: 1 %
HCT: 40.6 % (ref 36.0–46.0)
Hemoglobin: 13.2 g/dL (ref 12.0–15.0)
Immature Granulocytes: 0 %
Lymphocytes Relative: 40 %
Lymphs Abs: 2.6 10*3/uL (ref 0.7–4.0)
MCH: 27.6 pg (ref 26.0–34.0)
MCHC: 32.5 g/dL (ref 30.0–36.0)
MCV: 84.9 fL (ref 80.0–100.0)
Monocytes Absolute: 0.4 10*3/uL (ref 0.1–1.0)
Monocytes Relative: 6 %
Neutro Abs: 3.3 10*3/uL (ref 1.7–7.7)
Neutrophils Relative %: 53 %
Platelets: 306 10*3/uL (ref 150–400)
RBC: 4.78 MIL/uL (ref 3.87–5.11)
RDW: 12.8 % (ref 11.5–15.5)
WBC: 6.4 10*3/uL (ref 4.0–10.5)
nRBC: 0 % (ref 0.0–0.2)

## 2021-02-23 LAB — COMPREHENSIVE METABOLIC PANEL
ALT: 71 U/L — ABNORMAL HIGH (ref 0–44)
AST: 55 U/L — ABNORMAL HIGH (ref 15–41)
Albumin: 4.2 g/dL (ref 3.5–5.0)
Alkaline Phosphatase: 127 U/L — ABNORMAL HIGH (ref 38–126)
Anion gap: 7 (ref 5–15)
BUN: 17 mg/dL (ref 6–20)
CO2: 28 mmol/L (ref 22–32)
Calcium: 10.4 mg/dL — ABNORMAL HIGH (ref 8.9–10.3)
Chloride: 105 mmol/L (ref 98–111)
Creatinine, Ser: 0.68 mg/dL (ref 0.44–1.00)
GFR, Estimated: >60 mL/min (ref >60)
Glucose, Bld: 121 mg/dL — ABNORMAL HIGH (ref 70–99)
Potassium: 3.4 mmol/L — ABNORMAL LOW (ref 3.5–5.1)
Sodium: 140 mmol/L (ref 135–145)
Total Bilirubin: 0.7 mg/dL (ref 0.3–1.2)
Total Protein: 7.6 g/dL (ref 6.5–8.1)

## 2021-02-23 LAB — TROPONIN I (HIGH SENSITIVITY)
Troponin I (High Sensitivity): 4 ng/L (ref <18)
Troponin I (High Sensitivity): 3 ng/L (ref <18)

## 2021-02-23 LAB — PROTIME-INR
INR: 1 (ref 0.8–1.2)
Prothrombin Time: 13 seconds (ref 11.4–15.2)

## 2021-02-23 LAB — APTT: aPTT: 27 seconds (ref 24–36)

## 2021-02-23 LAB — LIPASE, BLOOD: Lipase: 38 U/L (ref 11–51)

## 2021-02-23 LAB — CK: Total CK: 26 U/L — ABNORMAL LOW (ref 38–234)

## 2021-02-23 LAB — ACETAMINOPHEN LEVEL: Acetaminophen (Tylenol), Serum: <10 ug/mL — ABNORMAL LOW (ref 10–30)

## 2021-02-23 MED ORDER — TRANEXAMIC ACID FOR EPISTAXIS
500.0000 mg | Freq: Once | TOPICAL | Status: AC
  Administered 2021-02-23: 500 mg via TOPICAL
  Filled 2021-02-23: qty 10

## 2021-02-23 MED ORDER — OXYMETAZOLINE HCL 0.05 % NA SOLN
1.0000 | Freq: Once | NASAL | Status: AC
  Administered 2021-02-23: 1 via NASAL
  Filled 2021-02-23: qty 30

## 2021-02-23 MED ORDER — POTASSIUM CHLORIDE CRYS ER 20 MEQ PO TBCR
40.0000 meq | EXTENDED_RELEASE_TABLET | Freq: Once | ORAL | Status: AC
  Administered 2021-02-23: 40 meq via ORAL
  Filled 2021-02-23: qty 2

## 2021-02-23 NOTE — ED Provider Notes (Signed)
[facility] Emergency Department Provider Note  ____________________________________________   Event Date/Time   First MD Initiated Contact with Patient 02/23/21 0401     (approximate)  I have reviewed the triage vital signs and the nursing notes.   HISTORY  Chief Complaint Epistaxis    HPI Brandi Terrell is a 55 y.o. female with hypertension who comes in with concerns for nosebleed.  Patient is now day 8 of COVID.  Recently completed treatment with mulnupiravir.  Patient reports being seen in the emergency room on 8/31.  On review of records she had some epigastric pain with a reassuring work-up.  She states that she had not had any other symptoms but then today she had had recurrent episodes of this midsternal slightly epigastric pain that wrapped around.  Denies any pain at this time.  Denies any shortness of breath with it.  But then states that she had a little bit of a headache when she went to bed and when she woke up the headache was gone after taking Excedrin but she was having a nosebleed out of both nostrils and her blood pressure was elevated so she wanted to come to the ER to be further evaluated.  To note patient already had her gallbladder and appendix removed.          Past Medical History:  Diagnosis Date   Acid reflux    Basal cell carcinoma (BCC) of face    Depression    Hypertension    Inflammatory polyps of colon ([place])    Sleep apnea     Patient Active Problem List   Diagnosis Date Noted   Constipation 01/01/2021   Left Achilles tendinitis 08/21/2020   Urinary frequency 08/21/2020   Increased thirst 08/21/2020   Hypercholesterolemia 04/26/2019   Situational mixed anxiety and depressive disorder 03/01/2019   History of basal cell cancer 01/26/2017   GAD (generalized anxiety disorder) 01/26/2017   Chronic insomnia 01/26/2017   Hypertension 01/26/2017   Chronic GERD 01/26/2017   Family history of colon cancer 01/26/2017    Prediabetes 01/26/2017   Perineal pain in female, chronic 01/26/2017   Moderate recurrent major depression ([place]) 11/03/2016   B12 deficiency 10/24/2016   Obstructive sleep apnea 11/02/2015   H/O adenomatous polyp of colon 11/02/2015   Basal cell carcinoma of skin of other parts of face 06/24/2014   Relaxation of external anal sphincter 06/24/2014    Past Surgical History:  Procedure Laterality Date   [place]  2005    Prior to Admission medications   Medication Sig Start Date End Date Taking? Authorizing Provider  amLODipine-valsartan (EXFORGE) 10-160 MG tablet TAKE 1 TABLET BY MOUTH EVERY DAY FOR BLOOD PRESSURE 02/08/21   Bedsole, Amy E, MD  benzonatate (TESSALON PERLES) 100 MG capsule Take 1 capsule (100 mg total) by mouth 3 (three) times daily as needed. 02/16/21   Lesleigh Noe, MD  cetirizine (ZYRTEC) 5 MG tablet Take 2.5 mg by mouth daily.    [provider]  DENTA 5000 PLUS 1.1 % CREA dental cream  10/30/20   [provider]  estradiol (VIVELLE-DOT) 0.05 MG/24HR patch 1 patch 2 (two) times a week. 02/10/21   [provider]  HYDROcodone bit-homatropine (HYCODAN) 5-1.5 MG/5ML syrup Take 5 mLs by mouth every 8 (eight) hours as needed for cough. 02/16/21   Lesleigh Noe, MD  lamoTRIgine (LAMICTAL) 100 MG tablet Take 200 mg by mouth.     [provider]  levonorgestrel (MIRENA) 20 MCG/24HR IUD 1 each by Intrauterine route once.    [provider]  liothyronine (CYTOMEL) 25 MCG tablet Take 25 mcg by mouth every morning. 02/19/20   [provider]  metoprolol succinate (TOPROL-XL) 100 MG 24 hr tablet TAKE 1 TABLET BY MOUTH EVERY DAY WITH OR IMMEDIATELY FOLLOWING A MEAL 11/30/20   Bedsole, Amy E, MD  Multiple Vitamin (MULTI-VITAMINS) TABS Take 1 tablet by mouth daily.    [provider]  MYRBETRIQ 25 MG TB24 tablet Take 25 mg by mouth daily. 12/16/20   [provider]  omeprazole (PRILOSEC) 20  MG capsule Take 10 mg by mouth daily.    [provider]  ondansetron (ZOFRAN ODT) 4 MG disintegrating tablet Take 1 tablet (4 mg total) by mouth every 8 (eight) hours as needed for nausea or vomiting. 02/17/21   Naaman Plummer, MD  zolpidem (AMBIEN) 10 MG tablet Take 5 mg by mouth at bedtime as needed for sleep.    [provider]    Allergies Sulfa antibiotics, Levofloxacin, Tramadol, and Cephalosporins  Family History  Problem Relation Age of Onset   Hypertension Mother    Depression Mother    Skin cancer Father    Hypertension Father    Depression Father    Cancer Father 80        colon   Stroke Maternal Grandmother    Stroke Paternal Grandmother    Heart disease Paternal Grandmother    Hypertension Paternal Grandmother    Cancer Maternal Grandfather        leukemia   Stroke Maternal Grandfather    Stroke Paternal Grandfather     Social History Social History   Tobacco Use   Smoking status: Never   Smokeless tobacco: Never  Vaping Use   Vaping Use: Never used  Substance Use Topics   Alcohol use: Yes    Comment: occ   Drug use: No      Review of Systems Constitutional: No fever/chills Eyes: No visual changes. ENT: No sore throat.  Nosebleed Cardiovascular: Chest pain Respiratory: Denies shortness of breath. Gastrointestinal: Abdominal pain Genitourinary: Negative for dysuria. Musculoskeletal: Negative for back pain. Skin: Negative for rash. Neurological: Negative for headaches, focal weakness or numbness. All other ROS negative ____________________________________________   PHYSICAL EXAM:  VITAL SIGNS: ED Triage Vitals  Enc Vitals Group     BP 02/23/21 0354 (!) 161/99     Pulse Rate 02/23/21 0354 84     Resp 02/23/21 0354 18     Temp --      Temp src --      SpO2 02/23/21 0354 97 %     Weight 02/23/21 0355 161 lb (73 kg)     Height 02/23/21 0355 '5\' 4"'$  (1.626 m)     Head Circumference --      Peak Flow --      Pain Score  02/23/21 0355 0     Pain Loc --      Pain Edu? --      Excl. in [place]? --     Constitutional: Alert and oriented. Well appearing and in no acute distress. Eyes: Conjunctivae are normal. EOMI. Head: Atraumatic. Nose: Blood noted.  Nose clamp in place Mouth/Throat: Mucous membranes are moist.   Neck: No stridor. Trachea Midline. FROM Cardiovascular: Normal rate, regular rhythm. Grossly normal heart sounds.  Good peripheral circulation. Respiratory: Normal respiratory effort.  No retractions. Lungs CTAB. Gastrointestinal: Soft and nontender. No distention. No  abdominal bruits.  Musculoskeletal: No lower extremity tenderness nor edema.  No joint effusions. Neurologic:  Normal speech and language. No gross focal neurologic deficits are appreciated.  Cranial nerves intact with equal strength in arms and leg Skin:  Skin is warm, dry and intact. No rash noted. Psychiatric: Mood and affect are normal. Speech and behavior are normal. GU: Deferred   ____________________________________________   LABS (all labs ordered are listed, but only abnormal results are displayed)  Labs Reviewed  COMPREHENSIVE METABOLIC PANEL - Abnormal; Notable for the following components:      Result Value   Potassium 3.4 (*)    Glucose, Bld 121 (*)    Calcium 10.4 (*)    AST 55 (*)    ALT 71 (*)    Alkaline Phosphatase 127 (*)    All other components within normal limits  CK - Abnormal; Notable for the following components:   Total CK 26 (*)    All other components within normal limits  CBC WITH DIFFERENTIAL/PLATELET  PROTIME-INR  APTT  LIPASE, BLOOD  ACETAMINOPHEN LEVEL  TROPONIN I (HIGH SENSITIVITY)  TROPONIN I (HIGH SENSITIVITY)   ____________________________________________   ED ECG REPORT I, Vanessa Dover, the attending physician, personally viewed and interpreted this ECG.  Normal sinus rate of 78, no st elevation, twi in lead three, normal intervals   ____________________________________________  RADIOLOGY Robert Bellow, personally viewed and evaluated these images (plain radiographs) as part of my medical decision making, as well as reviewing the written report by the radiologist.  ED MD interpretation:  no pna   Official radiology report(s): DG Chest 2 View  Result Date: 02/23/2021 CLINICAL DATA:  55 year old female positive COVID-19 last week. Epistaxis at 0330 hours. EXAM: CHEST - 2 VIEW COMPARISON:  Chest radiographs 02/17/2021 and earlier. FINDINGS: Lung volumes and mediastinal contours remain within normal limits. Visualized tracheal air column is within normal limits. Both lungs appear stable and clear. No pneumothorax or pleural effusion. Underlying thoracic scoliosis. No acute osseous abnormality identified. Stable cholecystectomy clips. Negative visible bowel gas pattern. IMPRESSION: No acute cardiopulmonary abnormality. Electronically Signed   By: Genevie Ann M.D.   On: 02/23/2021 05:23    ____________________________________________   PROCEDURES  Procedure(s) performed (including Critical Care):  .Epistaxis Management  Date/Time: 02/23/2021 4:46 AM Performed by: Vanessa Severn, MD Authorized by: Vanessa Whittier, MD   Consent:    Consent obtained:  Verbal   Consent given by:  Patient   Risks, benefits, and alternatives were discussed: yes     Risks discussed:  Bleeding, infection, nasal injury and pain   Alternatives discussed:  No treatment Universal protocol:    Patient identity confirmed:  Verbally with patient Anesthesia:    Anesthesia method:  None Comments:     Initially some gauze with TXA and Afrin were placed into the nose and clamp was reapplied.   ____________________________________________   INITIAL IMPRESSION / ASSESSMENT AND PLAN / ED COURSE  Brandi Terrell was evaluated in Emergency Department on 02/23/2021 for the symptoms described in the history of present illness. She was evaluated in the  context of the global COVID-19 pandemic, which necessitated consideration that the patient might be at risk for infection with the SARS-CoV-2 virus that causes COVID-19. Institutional protocols and algorithms that pertain to the evaluation of patients at risk for COVID-19 are in a state of rapid change based on information released by regulatory bodies including the CDC and federal and state organizations. These policies and algorithms  were followed during the patient's care in the ED.    Patient comes in with some midsternal, epigastric pain that started earlier today that is now mostly resolved but concern for a nosebleed.  Labs ordered to evaluate for any evidence of ACS, pneumonia, choledocholithiasis.  However patient already had her gallbladder removed.  Patient not on any blood thinners.  We will give some TXA and Afrin.  Patient does have a T wave version in lead III and on her prior EKGs there was more of just like a T wave flattening.  She denies any shortness of breath and that she is not tachycardic or hypoxic.  She currently denies any pain with breathing or any abdominal pain at this time aside very low suspicion for PE however I did discuss with her that if she develops shortness of breath she is to return to the ER.  She expressed understanding  5:55 AM the bleeding has now stopped and we will continue to monitor to see if we need to put any [place]  7:19 AM continues to not have any bleeding.  Patient has slightly elevated LFTs which I did check a CK that was normal.  That this be potentially from her medication that she was on for COVID.  She will follow-up with her primary care doctor for retest and further work-up if remains elevated.  She has already had her gallbladder removed.  She does report taking some Tylenol intermittently and some over-the-counter's with Tylenol in it but does not think she took more than 4 g in a day.  We will confirm get a Tylenol level.  Patient was handed off  to oncoming team pending Tylenol and troponin and reevaluation of bleeding if these are negative patient will feels comfortable following up with ENT       ____________________________________________   FINAL CLINICAL IMPRESSION(S) / ED DIAGNOSES   Final diagnoses:  Epistaxis      MEDICATIONS GIVEN DURING THIS VISIT:  Medications  oxymetazoline (AFRIN) 0.05 % nasal spray 1 spray (1 spray Each Nare Given 02/23/21 0437)  tranexamic acid (CYKLOKAPRON) 1000 MG/10ML topical solution 500 mg (500 mg Topical Given 02/23/21 0437)  potassium chloride SA (KLOR-CON) CR tablet 40 mEq (40 mEq Oral Given 02/23/21 [id number])     ED Discharge Orders     None        Note:  This document was prepared using Dragon voice recognition software and may include unintentional dictation errors.    Vanessa [place], MD 02/23/21 [phone]

## 2021-02-23 NOTE — ED Notes (Signed)
E sig pad not working. Pt signed for discharge but did not transfer to chart. Pt gives verbal consent to discharge.

## 2021-02-23 NOTE — ED Notes (Signed)
Pt in rad 

## 2021-02-23 NOTE — ED Notes (Signed)
ED Provider at bedside. 

## 2021-02-23 NOTE — Discharge Instructions (Addendum)
Your LFTs were elevated and they should be follow-up with your primary care doctor for recheck of remains elevated he may need further work-up of this  You can use the nose spray if you develop recurrent bleeding follow-up with ENT

## 2021-02-23 NOTE — ED Triage Notes (Signed)
Patient ambulatory to triage with steady gait, without difficulty or distress noted; pt reports +COVID last Monday; nosebleed began at 330am; nose clamp applied in triage

## 2021-02-25 ENCOUNTER — Telehealth (INDEPENDENT_AMBULATORY_CARE_PROVIDER_SITE_OTHER): Payer: BC Managed Care – PPO | Admitting: Family Medicine

## 2021-02-25 DIAGNOSIS — R040 Epistaxis: Secondary | ICD-10-CM | POA: Diagnosis not present

## 2021-02-25 DIAGNOSIS — R7989 Other specified abnormal findings of blood chemistry: Secondary | ICD-10-CM | POA: Diagnosis not present

## 2021-02-25 NOTE — Progress Notes (Signed)
VIRTUAL VISIT Due to national recommendations of social distancing due to [place] 19, a virtual visit is felt to be most appropriate for this patient at this time.   I connected with the patient on 02/25/21 at  9:00 AM EDT by virtual telehealth platform and verified that I am speaking with the correct person using two identifiers.   I discussed the limitations, risks, security and privacy concerns of performing an evaluation and management service by  virtual telehealth platform and the availability of in person appointments. I also discussed with the patient that there may be a patient responsible charge related to this service. The patient expressed understanding and agreed to proceed.  Patient location: Home Provider Location: [place] [facility] Participants:  Diona Browner and [name]   Chief Complaint  Patient presents with   Epistaxis    Started severely on Monday night and has had several more since then. Wants ENT referral.    History of Present Illness:   55 year old female presents following  several episodes of epistaxis  in last 4 days.  First episode on 9/5  after headache and BP 155/100  Tried to remain calm.  Woke up at night with bloody nose down back of throat. Seems to be left side. Went to ER 9/6 treated with packing and clip.  Hg 13/2, plt nml, PT INR normal  Bleeding stopped unitl small amount each day in last 3 days.   Also noted elevated LFTs at ER were elevated.  Nml acetaminophen level, nml lipase  She is using Bleed Cease OTC treatment.  No other bleeding or easy bruising.  Had COVID  8/29.. had some abd pain with this. She is having some bloating.. but feels this may be due to blood.   Has follow up 03/04/2021  No further abd pain, no N/V.    COVID 19 screen No recent travel or known exposure to [place] The patient denies respiratory symptoms of COVID 19 at this time.  The importance of social distancing was discussed today.   Review of  Systems  Constitutional:  Negative for chills and fever.  HENT:  Negative for congestion and ear pain.   Eyes:  Negative for pain and redness.  Respiratory:  Negative for cough and shortness of breath.   Cardiovascular:  Negative for chest pain, palpitations and leg swelling.  Gastrointestinal:  Negative for abdominal pain, blood in stool, constipation, diarrhea, nausea and vomiting.  Genitourinary:  Negative for dysuria.  Musculoskeletal:  Negative for falls and myalgias.  Skin:  Negative for rash.  Neurological:  Negative for dizziness.  Psychiatric/Behavioral:  Negative for depression. The patient is not nervous/anxious.      Past Medical History:  Diagnosis Date   Acid reflux    Basal cell carcinoma (BCC) of face    Depression    Hypertension    Inflammatory polyps of colon ([place])    Sleep apnea     reports that she has never smoked. She has never used smokeless tobacco. She reports current alcohol use. She reports that she does not use drugs.   Current Outpatient Medications:    amLODipine-valsartan (EXFORGE) 10-160 MG tablet, TAKE 1 TABLET BY MOUTH EVERY DAY FOR BLOOD PRESSURE, Disp: 90 tablet, Rfl: 1   benzonatate (TESSALON PERLES) 100 MG capsule, Take 1 capsule (100 mg total) by mouth 3 (three) times daily as needed., Disp: 20 capsule, Rfl: 0   cetirizine (ZYRTEC) 5 MG tablet, Take 2.5 mg by mouth daily., Disp: , Rfl:  DENTA 5000 PLUS 1.1 % CREA dental cream, , Disp: , Rfl:    estradiol (VIVELLE-DOT) 0.05 MG/24HR patch, 1 patch 2 (two) times a week., Disp: , Rfl:    HYDROcodone bit-homatropine (HYCODAN) 5-1.5 MG/5ML syrup, Take 5 mLs by mouth every 8 (eight) hours as needed for cough., Disp: 120 mL, Rfl: 0   lamoTRIgine (LAMICTAL) 100 MG tablet, Take 200 mg by mouth. , Disp: , Rfl:    levonorgestrel (MIRENA) 20 MCG/24HR IUD, 1 each by Intrauterine route once., Disp: , Rfl:    liothyronine (CYTOMEL) 25 MCG tablet, Take 25 mcg by mouth every morning., Disp: , Rfl:     metoprolol succinate (TOPROL-XL) 100 MG 24 hr tablet, TAKE 1 TABLET BY MOUTH EVERY DAY WITH OR IMMEDIATELY FOLLOWING A MEAL, Disp: 90 tablet, Rfl: 1   Multiple Vitamin (MULTI-VITAMINS) TABS, Take 1 tablet by mouth daily., Disp: , Rfl:    MYRBETRIQ 25 MG TB24 tablet, Take 25 mg by mouth daily. (Patient not taking: Reported on 02/25/2021), Disp: , Rfl:    omeprazole (PRILOSEC) 20 MG capsule, Take 10 mg by mouth daily., Disp: , Rfl:    ondansetron (ZOFRAN ODT) 4 MG disintegrating tablet, Take 1 tablet (4 mg total) by mouth every 8 (eight) hours as needed for nausea or vomiting., Disp: 20 tablet, Rfl: 0   zolpidem (AMBIEN) 10 MG tablet, Take 5 mg by mouth at bedtime as needed for sleep., Disp: , Rfl:    Observations/Objective: There were no vitals taken for this visit.  Physical Exam  Physical Exam Constitutional:      General: The patient is not in acute distress. Pulmonary:     Effort: Pulmonary effort is normal. No respiratory distress.  Neurological:     Mental Status: The patient is alert and oriented to person, place, and time.  Psychiatric:        Mood and Affect: Mood normal.        Behavior: Behavior normal.   Assessment and Plan Problem List Items Addressed This Visit     Elevated LFTs    Likely from recent COVID infeciton. Neg lipase in ER.  Has stopped any tylenol, ETOH   May be due to fatty liver vs viral infection.  re-eval in 1 week with lab testing and in person exam.      Relevant Orders   Comprehensive metabolic panel   Hepatitis panel, acute   Epistaxis - Primary    Recurrent. No emergent symptoms.  Nml hg, INR and plt in ER. Refer to ENT  Urgently for likely cautery/packing.  Avoid NSAIDs.   ER and return precautions given.   will check cbc upon return in 1 week.      Relevant Orders   Ambulatory referral to ENT   CBC with Differential/Platelet      I discussed the assessment and treatment plan with the patient. The patient was provided an opportunity  to ask questions and all were answered. The patient agreed with the plan and demonstrated an understanding of the instructions.   The patient was advised to call back or seek an in-person evaluation if the symptoms worsen or if the condition fails to improve as anticipated.     Eliezer Lofts, MD

## 2021-02-25 NOTE — Assessment & Plan Note (Addendum)
Recurrent. No emergent symptoms.  Nml hg, INR and plt in ER. Refer to ENT  Urgently for likely cautery/packing.  Avoid NSAIDs.   ER and return precautions given.   will check cbc upon return in 1 week.

## 2021-02-25 NOTE — Assessment & Plan Note (Addendum)
Likely from recent [place]. Neg lipase in ER.  Has stopped any tylenol, ETOH   May be due to fatty liver vs viral infection.  re-eval in 1 week with lab testing and in person exam.

## 2021-02-26 DIAGNOSIS — J340 Abscess, furuncle and carbuncle of nose: Secondary | ICD-10-CM | POA: Diagnosis not present

## 2021-02-26 DIAGNOSIS — R040 Epistaxis: Secondary | ICD-10-CM | POA: Diagnosis not present

## 2021-03-01 DIAGNOSIS — I10 Essential (primary) hypertension: Secondary | ICD-10-CM | POA: Diagnosis not present

## 2021-03-01 DIAGNOSIS — E669 Obesity, unspecified: Secondary | ICD-10-CM | POA: Diagnosis not present

## 2021-03-01 DIAGNOSIS — Z713 Dietary counseling and surveillance: Secondary | ICD-10-CM | POA: Diagnosis not present

## 2021-03-04 ENCOUNTER — Ambulatory Visit (INDEPENDENT_AMBULATORY_CARE_PROVIDER_SITE_OTHER): Payer: BC Managed Care – PPO | Admitting: Family Medicine

## 2021-03-04 ENCOUNTER — Encounter: Payer: Self-pay | Admitting: Family Medicine

## 2021-03-04 ENCOUNTER — Other Ambulatory Visit: Payer: Self-pay

## 2021-03-04 VITALS — BP 120/84 | HR 67 | Temp 97.9°F | Ht 63.75 in | Wt 159.5 lb

## 2021-03-04 DIAGNOSIS — I10 Essential (primary) hypertension: Secondary | ICD-10-CM

## 2021-03-04 DIAGNOSIS — N951 Menopausal and female climacteric states: Secondary | ICD-10-CM | POA: Insufficient documentation

## 2021-03-04 DIAGNOSIS — R0981 Nasal congestion: Secondary | ICD-10-CM | POA: Diagnosis not present

## 2021-03-04 DIAGNOSIS — R7989 Other specified abnormal findings of blood chemistry: Secondary | ICD-10-CM | POA: Diagnosis not present

## 2021-03-04 DIAGNOSIS — R040 Epistaxis: Secondary | ICD-10-CM

## 2021-03-04 DIAGNOSIS — R519 Headache, unspecified: Secondary | ICD-10-CM

## 2021-03-04 DIAGNOSIS — G4733 Obstructive sleep apnea (adult) (pediatric): Secondary | ICD-10-CM

## 2021-03-04 LAB — COMPREHENSIVE METABOLIC PANEL
ALT: 19 U/L (ref 0–35)
AST: 20 U/L (ref 0–37)
Albumin: 4.3 g/dL (ref 3.5–5.2)
Alkaline Phosphatase: 102 U/L (ref 39–117)
BUN: 17 mg/dL (ref 6–23)
CO2: 30 mEq/L (ref 19–32)
Calcium: 10.7 mg/dL — ABNORMAL HIGH (ref 8.4–10.5)
Chloride: 103 mEq/L (ref 96–112)
Creatinine, Ser: 0.66 mg/dL (ref 0.40–1.20)
GFR: 97.31 mL/min (ref >60.00)
Glucose, Bld: 92 mg/dL (ref 70–99)
Potassium: 4.1 mEq/L (ref 3.5–5.1)
Sodium: 140 mEq/L (ref 135–145)
Total Bilirubin: 0.8 mg/dL (ref 0.2–1.2)
Total Protein: 7.6 g/dL (ref 6.0–8.3)

## 2021-03-04 LAB — CBC WITH DIFFERENTIAL/PLATELET
Basophils Absolute: 0 10*3/uL (ref 0.0–0.1)
Basophils Relative: 0.4 % (ref 0.0–3.0)
Eosinophils Absolute: 0 10*3/uL (ref 0.0–0.7)
Eosinophils Relative: 0.6 % (ref 0.0–5.0)
HCT: 37.6 % (ref 36.0–46.0)
Hemoglobin: 12.3 g/dL (ref 12.0–15.0)
Lymphocytes Relative: 36.7 % (ref 12.0–46.0)
Lymphs Abs: 2.2 10*3/uL (ref 0.7–4.0)
MCHC: 32.7 g/dL (ref 30.0–36.0)
MCV: 85.2 fl (ref 78.0–100.0)
Monocytes Absolute: 0.3 10*3/uL (ref 0.1–1.0)
Monocytes Relative: 5.7 % (ref 3.0–12.0)
Neutro Abs: 3.4 10*3/uL (ref 1.4–7.7)
Neutrophils Relative %: 56.6 % (ref 43.0–77.0)
Platelets: 311 10*3/uL (ref 150.0–400.0)
RBC: 4.41 Mil/uL (ref 3.87–5.11)
RDW: 13.8 % (ref 11.5–15.5)
WBC: 6.1 10*3/uL (ref 4.0–10.5)

## 2021-03-04 NOTE — Assessment & Plan Note (Signed)
Chronic, Good control, but elevated in AMs.  Exforge  160/10and metoprolol 100 mg.

## 2021-03-04 NOTE — Progress Notes (Signed)
Patient ID: Brandi Terrell, female    DOB: 1966-01-06, 55 y.o.   MRN: [id number]  This visit was conducted in person.  BP 120/84   Pulse 67   Temp 97.9 F (36.6 C) (Temporal)   Ht 5' 3.75" (1.619 m)   Wt 159 lb 8 oz (72.3 kg)   SpO2 98%   BMI 27.59 kg/m    CC: Chief Complaint  Patient presents with   Follow-up    Elevated Liver Enzymes from ER Visit   Epistaxis    Subjective:   HPI: Brandi Terrell is a 55 y.o. female presenting on 03/04/2021 for Follow-up (Elevated Liver Enzymes from ER Visit) and Epistaxis   Reviewed virtual OV from 02/25/2021 for hospital follow up.  Felt   Likely from recent [place]. Neg lipase in ER.  Has stopped any tylenol, ETOH   May be due to fatty liver vs viral infection.  S?P cholecystectomy.         She presents today for in person exam and lab recheck.  Elevated LFTs: At ER were elevated.  Nml acetaminophen level, nml lipase.  No abdominal pain since Er visit.  Epistaxis: At last OV referred to ENT.Marland Kitchen last week she had cautery of inside of left nostril.  Since then no further nose bleeds.   She is still having some drainage, post nasally.  Sinus pressure... frontal pressure. Left maxillary sinus feels more stuffy.  No fever.   BP improved here but she had elevation when feeling bad.. 154/74. BP Readings from Last 3 Encounters:  03/04/21 120/84  02/23/21 138/76  02/17/21 129/62   She continues to wwak up with AM headache. X several weeks.Marland Kitchen even prior to COVID. She snores at night... has  sleep apnea..not able to  use CPAP.   She is using Exforge  160/10and metoprolol 100 mg.      Relevant past medical, surgical, family and social history reviewed and updated as indicated. Interim medical history since our last visit reviewed. Allergies and medications reviewed and updated. Outpatient Medications Prior to Visit  Medication Sig Dispense Refill   amLODipine-valsartan (EXFORGE) 10-160 MG tablet TAKE 1 TABLET BY  MOUTH EVERY DAY FOR BLOOD PRESSURE 90 tablet 1   DENTA 5000 PLUS 1.1 % CREA dental cream      estradiol (VIVELLE-DOT) 0.05 MG/24HR patch 1 patch 2 (two) times a week.     lamoTRIgine (LAMICTAL) 100 MG tablet Take 200 mg by mouth.      levonorgestrel (MIRENA) 20 MCG/24HR IUD 1 each by Intrauterine route once.     liothyronine (CYTOMEL) 25 MCG tablet Take 25 mcg by mouth every morning.     Magnesium Citrate 100 MG CAPS Take 150 mg by mouth daily.     metoprolol succinate (TOPROL-XL) 100 MG 24 hr tablet TAKE 1 TABLET BY MOUTH EVERY DAY WITH OR IMMEDIATELY FOLLOWING A MEAL 90 tablet 1   Multiple Vitamin (MULTI-VITAMINS) TABS Take 1 tablet by mouth daily.     omeprazole (PRILOSEC) 20 MG capsule Take 10 mg by mouth daily.     zolpidem (AMBIEN) 10 MG tablet Take 5 mg by mouth at bedtime as needed for sleep.     benzonatate (TESSALON PERLES) 100 MG capsule Take 1 capsule (100 mg total) by mouth 3 (three) times daily as needed. 20 capsule 0   cetirizine (ZYRTEC) 5 MG tablet Take 2.5 mg by mouth daily.     HYDROcodone bit-homatropine (HYCODAN) 5-1.5 MG/5ML syrup Take 5 mLs by mouth  every 8 (eight) hours as needed for cough. 120 mL 0   MYRBETRIQ 25 MG TB24 tablet Take 25 mg by mouth daily. (Patient not taking: Reported on 02/25/2021)     ondansetron (ZOFRAN ODT) 4 MG disintegrating tablet Take 1 tablet (4 mg total) by mouth every 8 (eight) hours as needed for nausea or vomiting. 20 tablet 0   No facility-administered medications prior to visit.     Per HPI unless specifically indicated in ROS section below Review of Systems  Constitutional:  Negative for fatigue and fever.  HENT:  Negative for ear pain.   Eyes:  Negative for pain.  Respiratory:  Negative for chest tightness and shortness of breath.   Cardiovascular:  Negative for chest pain, palpitations and leg swelling.  Gastrointestinal:  Negative for abdominal pain.  Genitourinary:  Negative for dysuria.  Objective:  BP 120/84   Pulse 67    Temp 97.9 F (36.6 C) (Temporal)   Ht 5' 3.75" (1.619 m)   Wt 159 lb 8 oz (72.3 kg)   SpO2 98%   BMI 27.59 kg/m   Wt Readings from Last 3 Encounters:  03/04/21 159 lb 8 oz (72.3 kg)  02/23/21 161 lb (73 kg)  02/17/21 160 lb (72.6 kg)      Physical Exam Constitutional:      General: She is not in acute distress.    Appearance: Normal appearance. She is well-developed. She is not ill-appearing or toxic-appearing.  HENT:     Head: Normocephalic.     Right Ear: Hearing, tympanic membrane, ear canal and external ear normal. Tympanic membrane is not erythematous, retracted or bulging.     Left Ear: Hearing, tympanic membrane, ear canal and external ear normal. Tympanic membrane is not erythematous, retracted or bulging.     Nose: No mucosal edema or rhinorrhea.     Right Sinus: No maxillary sinus tenderness or frontal sinus tenderness.     Left Sinus: No maxillary sinus tenderness or frontal sinus tenderness.     Mouth/Throat:     Pharynx: Uvula midline.  Eyes:     General: Lids are normal. Lids are everted, no foreign bodies appreciated.     Conjunctiva/sclera: Conjunctivae normal.     Pupils: Pupils are equal, round, and reactive to light.  Neck:     Thyroid: No thyroid mass or thyromegaly.     Vascular: No carotid bruit.     Trachea: Trachea normal.  Cardiovascular:     Rate and Rhythm: Normal rate and regular rhythm.     Pulses: Normal pulses.     Heart sounds: Normal heart sounds, S1 normal and S2 normal. No murmur heard.   No friction rub. No gallop.  Pulmonary:     Effort: Pulmonary effort is normal. No tachypnea or respiratory distress.     Breath sounds: Normal breath sounds. No decreased breath sounds, wheezing, rhonchi or rales.  Abdominal:     General: Bowel sounds are normal.     Palpations: Abdomen is soft.     Tenderness: There is no abdominal tenderness.  Musculoskeletal:     Cervical back: Normal range of motion and neck supple.  Skin:    General: Skin is  warm and dry.     Findings: No rash.  Neurological:     Mental Status: She is alert.  Psychiatric:        Mood and Affect: Mood is not anxious or depressed.        Speech: Speech normal.  Behavior: Behavior normal. Behavior is cooperative.        Thought Content: Thought content normal.        Judgment: Judgment normal.      Results for orders placed or performed during the hospital encounter of 02/23/21  CBC with Differential  Result Value Ref Range   WBC 6.4 4.0 - 10.5 K/uL   RBC 4.78 3.87 - 5.11 MIL/uL   Hemoglobin 13.2 12.0 - 15.0 g/dL   HCT 40.6 36.0 - 46.0 %   MCV 84.9 80.0 - 100.0 fL   MCH 27.6 26.0 - 34.0 pg   MCHC 32.5 30.0 - 36.0 g/dL   RDW 12.8 11.5 - 15.5 %   Platelets 306 150 - 400 K/uL   nRBC 0.0 0.0 - 0.2 %   Neutrophils Relative % 53 %   Neutro Abs 3.3 1.7 - 7.7 K/uL   Lymphocytes Relative 40 %   Lymphs Abs 2.6 0.7 - 4.0 K/uL   Monocytes Relative 6 %   Monocytes Absolute 0.4 0.1 - 1.0 K/uL   Eosinophils Relative 1 %   Eosinophils Absolute 0.1 0.0 - 0.5 K/uL   Basophils Relative 0 %   Basophils Absolute 0.0 0.0 - 0.1 K/uL   Immature Granulocytes 0 %   Abs Immature Granulocytes 0.02 0.00 - 0.07 K/uL  Comprehensive metabolic panel  Result Value Ref Range   Sodium 140 135 - 145 mmol/L   Potassium 3.4 (L) 3.5 - 5.1 mmol/L   Chloride 105 98 - 111 mmol/L   CO2 28 22 - 32 mmol/L   Glucose, Bld 121 (H) 70 - 99 mg/dL   BUN 17 6 - 20 mg/dL   Creatinine, Ser 0.68 0.44 - 1.00 mg/dL   Calcium 10.4 (H) 8.9 - 10.3 mg/dL   Total Protein 7.6 6.5 - 8.1 g/dL   Albumin 4.2 3.5 - 5.0 g/dL   AST 55 (H) 15 - 41 U/L   ALT 71 (H) 0 - 44 U/L   Alkaline Phosphatase 127 (H) 38 - 126 U/L   Total Bilirubin 0.7 0.3 - 1.2 mg/dL   GFR, Estimated >60 >60 mL/min   Anion gap 7 5 - 15  Protime-INR  Result Value Ref Range   Prothrombin Time 13.0 11.4 - 15.2 seconds   INR 1.0 0.8 - 1.2  APTT  Result Value Ref Range   aPTT 27 24 - 36 seconds  Lipase, blood  Result Value  Ref Range   Lipase 38 11 - 51 U/L  CK  Result Value Ref Range   Total CK 26 (L) 38 - 234 U/L  Acetaminophen level  Result Value Ref Range   Acetaminophen (Tylenol), Serum <10 (L) 10 - 30 ug/mL  Troponin I (High Sensitivity)  Result Value Ref Range   Troponin I (High Sensitivity) 4 <18 ng/L  Troponin I (High Sensitivity)  Result Value Ref Range   Troponin I (High Sensitivity) 3 <18 ng/L    This visit occurred during the SARS-CoV-2 public health emergency.  Safety protocols were in place, including screening questions prior to the visit, additional usage of staff PPE, and extensive cleaning of exam room while observing appropriate contact time as indicated for disinfecting solutions.   COVID 19 screen:  No recent travel or known exposure to COVID19 The patient denies respiratory symptoms of COVID 19 at this time. The importance of social distancing was discussed today.   Assessment and Plan Problem List Items Addressed This Visit     Hypertension (Chronic)  Chronic, Good control, but elevated in AMs.  Exforge  160/10and metoprolol 100 mg.       Obstructive sleep apnea (Chronic)    Untreated.. like cause of AM HTN and AM headaches.  Cannot tolerate CPAP.Marland Kitchen she will move forward with  Setting up appointment to make an oral appliance Reviewed complications from untreated sleep apnea.      Congestion of nasal sinus    Possibly secondary to cautery vs allergies.  No S?S of bacterial infection.  Can restart zyrtec and use mucinex, plain prn.      Elevated LFTs    Eval with labs.. expect improvement given low fat diet, off tylenol and COVID resolved.  if not in nml range.. will refer for US liver.      Epistaxis - Primary    Resolved S/P cautery with ENT.      Other Visit Diagnoses     Nonintractable episodic headache, unspecified headache type              Eliezer Lofts, MD

## 2021-03-04 NOTE — Assessment & Plan Note (Signed)
Possibly secondary to cautery vs allergies.  No S?S of bacterial infection.  Can restart zyrtec and use mucinex, plain prn.

## 2021-03-04 NOTE — Patient Instructions (Addendum)
Please stop at the lab to have labs drawn.  Please  contact the  [facility] dentist about the sleep apnea oral appliance.  Follow BP daily.. call if consistently   Restart zyrtec.

## 2021-03-04 NOTE — Assessment & Plan Note (Signed)
Eval with labs.. expect improvement given low fat diet, off tylenol and COVID resolved.  if not in nml range.. will refer for US liver.

## 2021-03-04 NOTE — Assessment & Plan Note (Signed)
Resolved S/P cautery with ENT.

## 2021-03-04 NOTE — Assessment & Plan Note (Signed)
Untreated.. like cause of AM HTN and AM headaches.  Cannot tolerate CPAP.Marland Kitchen she will move forward with  Setting up appointment to make an oral appliance Reviewed complications from untreated sleep apnea.

## 2021-03-05 LAB — HEPATITIS PANEL, ACUTE
Hep A IgM: NONREACTIVE
Hep B C IgM: NONREACTIVE
Hepatitis B Surface Ag: NONREACTIVE
Hepatitis C Ab: NONREACTIVE
SIGNAL TO CUT-OFF: 0.01 (ref <1.00)

## 2021-03-09 DIAGNOSIS — N840 Polyp of corpus uteri: Secondary | ICD-10-CM | POA: Diagnosis not present

## 2021-03-09 DIAGNOSIS — N951 Menopausal and female climacteric states: Secondary | ICD-10-CM | POA: Diagnosis not present

## 2021-03-11 DIAGNOSIS — F32A Depression, unspecified: Secondary | ICD-10-CM | POA: Diagnosis not present

## 2021-03-11 DIAGNOSIS — E669 Obesity, unspecified: Secondary | ICD-10-CM | POA: Diagnosis not present

## 2021-03-11 DIAGNOSIS — G4733 Obstructive sleep apnea (adult) (pediatric): Secondary | ICD-10-CM | POA: Diagnosis not present

## 2021-03-11 DIAGNOSIS — I10 Essential (primary) hypertension: Secondary | ICD-10-CM | POA: Diagnosis not present

## 2021-03-15 DIAGNOSIS — Z713 Dietary counseling and surveillance: Secondary | ICD-10-CM | POA: Diagnosis not present

## 2021-03-15 DIAGNOSIS — E669 Obesity, unspecified: Secondary | ICD-10-CM | POA: Diagnosis not present

## 2021-03-15 DIAGNOSIS — I10 Essential (primary) hypertension: Secondary | ICD-10-CM | POA: Diagnosis not present

## 2021-03-23 ENCOUNTER — Other Ambulatory Visit: Payer: Self-pay

## 2021-03-23 ENCOUNTER — Telehealth (INDEPENDENT_AMBULATORY_CARE_PROVIDER_SITE_OTHER): Payer: BC Managed Care – PPO | Admitting: Family Medicine

## 2021-03-23 DIAGNOSIS — K644 Residual hemorrhoidal skin tags: Secondary | ICD-10-CM | POA: Insufficient documentation

## 2021-03-23 DIAGNOSIS — K5904 Chronic idiopathic constipation: Secondary | ICD-10-CM | POA: Diagnosis not present

## 2021-03-23 DIAGNOSIS — Z780 Asymptomatic menopausal state: Secondary | ICD-10-CM | POA: Insufficient documentation

## 2021-03-23 DIAGNOSIS — R040 Epistaxis: Secondary | ICD-10-CM | POA: Diagnosis not present

## 2021-03-23 MED ORDER — HYDROCORTISONE (PERIANAL) 2.5 % EX CREA: 1.0000 "application " | TOPICAL_CREAM | Freq: Two times a day (BID) | CUTANEOUS | 0 refills | Status: DC

## 2021-03-23 NOTE — Patient Instructions (Addendum)
Increase exercise. Start miralax 17 gm daily.  Can use topical hydrocortisone cream

## 2021-03-23 NOTE — Progress Notes (Signed)
Patient ID: Brandi Terrell, female    DOB: May 15, 1966, 55 y.o.   MRN: [id number]  This visit was conducted in person.  There were no vitals taken for this visit.   CC: Chief Complaint  Patient presents with   Results    FST hormonal bloodwork     Subjective:   HPI: Brandi Terrell is a 55 y.o. female presenting on 03/23/2021 for Results (FST hormonal bloodwork )   She reports she has been seeing GYN for  cervical and possible endometrial polyp... paln hysteroscopy    She is taking with a nutritionist   Has IUD and  estradiol patch.     Wt Readings from Last 3 Encounters:  03/04/21 159 lb 8 oz (72.3 kg)  02/23/21 161 lb (73 kg)  02/17/21 160 lb (72.6 kg)    She has had external hemorrhoids, non painful, not itching occ brbpr with BM when straining.  She has been constipated since on kepto diet.. using 2 tablespoon metamucil daily and on mag citrate.   Having BM every 3-4 days.  Drionking a lot of water daily.  Relevant past medical, surgical, family and social history reviewed and updated as indicated. Interim medical history since our last visit reviewed. Allergies and medications reviewed and updated. Outpatient Medications Prior to Visit  Medication Sig Dispense Refill   amLODipine-valsartan (EXFORGE) 10-160 MG tablet TAKE 1 TABLET BY MOUTH EVERY DAY FOR BLOOD PRESSURE 90 tablet 1   DENTA 5000 PLUS 1.1 % CREA dental cream      estradiol (VIVELLE-DOT) 0.05 MG/24HR patch 1 patch 2 (two) times a week.     lamoTRIgine (LAMICTAL) 100 MG tablet Take 200 mg by mouth.      levonorgestrel (MIRENA) 20 MCG/24HR IUD 1 each by Intrauterine route once.     liothyronine (CYTOMEL) 25 MCG tablet Take 25 mcg by mouth every morning.     Magnesium Citrate 100 MG CAPS Take 150 mg by mouth daily.     metoprolol succinate (TOPROL-XL) 100 MG 24 hr tablet TAKE 1 TABLET BY MOUTH EVERY DAY WITH OR IMMEDIATELY FOLLOWING A MEAL 90 tablet 1   Multiple Vitamin (MULTI-VITAMINS) TABS Take 1  tablet by mouth daily.     omeprazole (PRILOSEC) 20 MG capsule Take 10 mg by mouth daily.     zolpidem (AMBIEN) 10 MG tablet Take 5 mg by mouth at bedtime as needed for sleep.     No facility-administered medications prior to visit.     Per HPI unless specifically indicated in ROS section below Review of Systems  Constitutional:  Negative for fatigue and fever.  HENT:  Negative for congestion.   Eyes:  Negative for pain.  Respiratory:  Negative for cough and shortness of breath.   Cardiovascular:  Negative for chest pain, palpitations and leg swelling.  Gastrointestinal:  Negative for abdominal pain.  Genitourinary:  Negative for dysuria and vaginal bleeding.  Musculoskeletal:  Negative for back pain.  Neurological:  Negative for syncope, light-headedness and headaches.  Psychiatric/Behavioral:  Negative for dysphoric mood.   Objective:  There were no vitals taken for this visit.  Wt Readings from Last 3 Encounters:  03/04/21 159 lb 8 oz (72.3 kg)  02/23/21 161 lb (73 kg)  02/17/21 160 lb (72.6 kg)      Physical Exam Physical Exam Constitutional:      General: The patient is not in acute distress. Pulmonary:     Effort: Pulmonary effort is normal. No respiratory distress.  Neurological:  Mental Status: The patient is alert and oriented to person, place, and time.  Psychiatric:        Mood and Affect: Mood normal.        Behavior: Behavior normal.      Results for orders placed or performed in visit on 03/04/21  Comprehensive metabolic panel  Result Value Ref Range   Sodium 140 135 - 145 mEq/L   Potassium 4.1 3.5 - 5.1 mEq/L   Chloride 103 96 - 112 mEq/L   CO2 30 19 - 32 mEq/L   Glucose, Bld 92 70 - 99 mg/dL   BUN 17 6 - 23 mg/dL   Creatinine, Ser 0.66 0.40 - 1.20 mg/dL   Total Bilirubin 0.8 0.2 - 1.2 mg/dL   Alkaline Phosphatase 102 39 - 117 U/L   AST 20 0 - 37 U/L   ALT 19 0 - 35 U/L   Total Protein 7.6 6.0 - 8.3 g/dL   Albumin 4.3 3.5 - 5.2 g/dL   GFR  97.31 >60.00 mL/min   Calcium 10.7 (H) 8.4 - 10.5 mg/dL  Hepatitis panel, acute  Result Value Ref Range   Hep A IgM NON-REACTIVE NON-REACTIVE   Hepatitis B Surface Ag NON-REACTIVE NON-REACTIVE   Hep B C IgM NON-REACTIVE NON-REACTIVE   Hepatitis C Ab NON-REACTIVE NON-REACTIVE   SIGNAL TO CUT-OFF 0.01 <1.00  CBC with Differential/Platelet  Result Value Ref Range   WBC 6.1 4.0 - 10.5 K/uL   RBC 4.41 3.87 - 5.11 Mil/uL   Hemoglobin 12.3 12.0 - 15.0 g/dL   HCT 37.6 36.0 - 46.0 %   MCV 85.2 78.0 - 100.0 fl   MCHC 32.7 30.0 - 36.0 g/dL   RDW 13.8 11.5 - 15.5 %   Platelets 311.0 150.0 - 400.0 K/uL   Neutrophils Relative % 56.6 43.0 - 77.0 %   Lymphocytes Relative 36.7 12.0 - 46.0 %   Monocytes Relative 5.7 3.0 - 12.0 %   Eosinophils Relative 0.6 0.0 - 5.0 %   Basophils Relative 0.4 0.0 - 3.0 %   Neutro Abs 3.4 1.4 - 7.7 K/uL   Lymphs Abs 2.2 0.7 - 4.0 K/uL   Monocytes Absolute 0.3 0.1 - 1.0 K/uL   Eosinophils Absolute 0.0 0.0 - 0.7 K/uL   Basophils Absolute 0.0 0.0 - 0.1 K/uL  HM PAP SMEAR  Result Value Ref Range   HM Pap smear See report in Care Everywhere   Results Console HPV  Result Value Ref Range   CHL HPV Negative     This visit occurred during the SARS-CoV-2 public health emergency.  Safety protocols were in place, including screening questions prior to the visit, additional usage of staff PPE, and extensive cleaning of exam room while observing appropriate contact time as indicated for disinfecting solutions.   COVID 19 screen:  No recent travel or known exposure to COVID19 The patient denies respiratory symptoms of COVID 19 at this time. The importance of social distancing was discussed today.   Assessment and Plan    Problem List Items Addressed This Visit     Constipation    Chronic, worsened with keto diet.   Already with increased fiber and water take.  Start miralax 17  Gm daily       External hemorrhoids    Treat with OTC topical hydrocortisone.       Post-menopause - Primary    Discussed FSH and answered questions about IUD and estradiol.  She will proceed with hysteroscopy and  Discuss further  with GYN.      Meds ordered this encounter  Medications   hydrocortisone (ANUSOL-HC) 2.5 % rectal cream    Sig: Place 1 application rectally 2 (two) times daily.    Dispense:  30 g    Refill:  0     Eliezer Lofts, MD

## 2021-03-23 NOTE — Assessment & Plan Note (Signed)
Treat with OTC topical hydrocortisone.

## 2021-03-23 NOTE — Assessment & Plan Note (Signed)
Chronic, worsened with keto diet.   Already with increased fiber and water take.  Start miralax 17  Gm daily

## 2021-03-23 NOTE — Assessment & Plan Note (Addendum)
Discussed [place] and answered questions about IUD and estradiol.  She will proceed with hysteroscopy and  Discuss further with GYN.

## 2021-04-06 DIAGNOSIS — Z713 Dietary counseling and surveillance: Secondary | ICD-10-CM | POA: Diagnosis not present

## 2021-04-06 DIAGNOSIS — E669 Obesity, unspecified: Secondary | ICD-10-CM | POA: Diagnosis not present

## 2021-04-06 DIAGNOSIS — I10 Essential (primary) hypertension: Secondary | ICD-10-CM | POA: Diagnosis not present

## 2021-04-06 DIAGNOSIS — Z23 Encounter for immunization: Secondary | ICD-10-CM | POA: Diagnosis not present

## 2021-04-20 DIAGNOSIS — E669 Obesity, unspecified: Secondary | ICD-10-CM | POA: Diagnosis not present

## 2021-04-20 DIAGNOSIS — Z713 Dietary counseling and surveillance: Secondary | ICD-10-CM | POA: Diagnosis not present

## 2021-04-20 DIAGNOSIS — I10 Essential (primary) hypertension: Secondary | ICD-10-CM | POA: Diagnosis not present

## 2021-04-30 ENCOUNTER — Other Ambulatory Visit: Payer: BC Managed Care – PPO

## 2021-05-03 DIAGNOSIS — F3341 Major depressive disorder, recurrent, in partial remission: Secondary | ICD-10-CM | POA: Diagnosis not present

## 2021-05-04 DIAGNOSIS — E669 Obesity, unspecified: Secondary | ICD-10-CM | POA: Diagnosis not present

## 2021-05-04 DIAGNOSIS — I10 Essential (primary) hypertension: Secondary | ICD-10-CM | POA: Diagnosis not present

## 2021-05-04 DIAGNOSIS — Z713 Dietary counseling and surveillance: Secondary | ICD-10-CM | POA: Diagnosis not present

## 2021-05-05 DIAGNOSIS — I10 Essential (primary) hypertension: Secondary | ICD-10-CM | POA: Diagnosis not present

## 2021-05-05 DIAGNOSIS — E2839 Other primary ovarian failure: Secondary | ICD-10-CM | POA: Diagnosis not present

## 2021-05-05 DIAGNOSIS — F32A Depression, unspecified: Secondary | ICD-10-CM | POA: Diagnosis not present

## 2021-05-05 DIAGNOSIS — G4733 Obstructive sleep apnea (adult) (pediatric): Secondary | ICD-10-CM | POA: Diagnosis not present

## 2021-05-06 DIAGNOSIS — Z1231 Encounter for screening mammogram for malignant neoplasm of breast: Secondary | ICD-10-CM | POA: Diagnosis not present

## 2021-05-07 ENCOUNTER — Encounter: Payer: Self-pay | Admitting: Family Medicine

## 2021-05-07 ENCOUNTER — Other Ambulatory Visit: Payer: Self-pay

## 2021-05-07 ENCOUNTER — Ambulatory Visit (INDEPENDENT_AMBULATORY_CARE_PROVIDER_SITE_OTHER): Payer: BC Managed Care – PPO | Admitting: Family Medicine

## 2021-05-07 VITALS — BP 130/90 | HR 70 | Temp 97.9°F | Ht 63.25 in | Wt 158.2 lb

## 2021-05-07 DIAGNOSIS — N939 Abnormal uterine and vaginal bleeding, unspecified: Secondary | ICD-10-CM | POA: Diagnosis not present

## 2021-05-07 DIAGNOSIS — E7800 Pure hypercholesterolemia, unspecified: Secondary | ICD-10-CM

## 2021-05-07 DIAGNOSIS — F331 Major depressive disorder, recurrent, moderate: Secondary | ICD-10-CM

## 2021-05-07 DIAGNOSIS — E538 Deficiency of other specified B group vitamins: Secondary | ICD-10-CM

## 2021-05-07 DIAGNOSIS — I10 Essential (primary) hypertension: Secondary | ICD-10-CM | POA: Diagnosis not present

## 2021-05-07 DIAGNOSIS — Z0000 Encounter for general adult medical examination without abnormal findings: Secondary | ICD-10-CM

## 2021-05-07 DIAGNOSIS — R7303 Prediabetes: Secondary | ICD-10-CM | POA: Diagnosis not present

## 2021-05-07 LAB — COMPREHENSIVE METABOLIC PANEL
ALT: 19 U/L (ref 0–35)
AST: 24 U/L (ref 0–37)
Albumin: 4.5 g/dL (ref 3.5–5.2)
Alkaline Phosphatase: 95 U/L (ref 39–117)
BUN: 22 mg/dL (ref 6–23)
CO2: 27 mEq/L (ref 19–32)
Calcium: 10.1 mg/dL (ref 8.4–10.5)
Chloride: 104 mEq/L (ref 96–112)
Creatinine, Ser: 0.74 mg/dL (ref 0.40–1.20)
GFR: 89.64 mL/min (ref >60.00)
Glucose, Bld: 100 mg/dL — ABNORMAL HIGH (ref 70–99)
Potassium: 4.4 mEq/L (ref 3.5–5.1)
Sodium: 139 mEq/L (ref 135–145)
Total Bilirubin: 0.5 mg/dL (ref 0.2–1.2)
Total Protein: 7.3 g/dL (ref 6.0–8.3)

## 2021-05-07 LAB — CBC WITH DIFFERENTIAL/PLATELET
Basophils Absolute: 0 10*3/uL (ref 0.0–0.1)
Basophils Relative: 0.4 % (ref 0.0–3.0)
Eosinophils Absolute: 0.1 10*3/uL (ref 0.0–0.7)
Eosinophils Relative: 1.8 % (ref 0.0–5.0)
HCT: 38.2 % (ref 36.0–46.0)
Hemoglobin: 12.3 g/dL (ref 12.0–15.0)
Lymphocytes Relative: 38.5 % (ref 12.0–46.0)
Lymphs Abs: 2.1 10*3/uL (ref 0.7–4.0)
MCHC: 32.2 g/dL (ref 30.0–36.0)
MCV: 85.8 fl (ref 78.0–100.0)
Monocytes Absolute: 0.3 10*3/uL (ref 0.1–1.0)
Monocytes Relative: 6 % (ref 3.0–12.0)
Neutro Abs: 2.9 10*3/uL (ref 1.4–7.7)
Neutrophils Relative %: 53.3 % (ref 43.0–77.0)
Platelets: 281 10*3/uL (ref 150.0–400.0)
RBC: 4.46 Mil/uL (ref 3.87–5.11)
RDW: 13.9 % (ref 11.5–15.5)
WBC: 5.4 10*3/uL (ref 4.0–10.5)

## 2021-05-07 LAB — LIPID PANEL
Cholesterol: 184 mg/dL (ref 0–200)
HDL: 54 mg/dL (ref >39.00)
LDL Cholesterol: 120 mg/dL — ABNORMAL HIGH (ref 0–99)
NonHDL: 129.77
Total CHOL/HDL Ratio: 3
Triglycerides: 51 mg/dL (ref 0.0–149.0)
VLDL: 10.2 mg/dL (ref 0.0–40.0)

## 2021-05-07 LAB — HEMOGLOBIN A1C: Hgb A1c MFr Bld: 5.9 % (ref 4.6–6.5)

## 2021-05-07 LAB — VITAMIN B12: Vitamin B-12: 344 pg/mL (ref 211–911)

## 2021-05-07 NOTE — Progress Notes (Signed)
Patient ID: Brandi Terrell, female    DOB: 1965-11-28, 55 y.o.   MRN: [id number]  This visit was conducted in person.  BP 130/90   Pulse 70   Temp 97.9 F (36.6 C) (Temporal)   Ht 5' 3.25" (1.607 m)   Wt 158 lb 4 oz (71.8 kg)   SpO2 97%   BMI 27.81 kg/m    CC: Chief Complaint  Patient presents with   Annual Exam    Subjective:   HPI: Brandi Terrell is a 55 y.o. female presenting on 05/07/2021 for Annual Exam   Labs reviewed in detail.  Elevated Cholesterol: At goal with diet. Lab Results  Component Value Date   CHOL 163 01/01/2021   HDL 40.00 01/01/2021   LDLCALC 105 (H) 01/01/2021   TRIG 94.0 01/01/2021   CHOLHDL 4 01/01/2021  The 10-year ASCVD risk score (Arnett DK, et al., 2019) is: 3.6%   Values used to calculate the score:     Age: 55 years     Sex: Female     Is Non-Hispanic African American: No     Diabetic: No     Tobacco smoker: No     Systolic Blood Pressure: 794 mmHg     Is BP treated: Yes     HDL Cholesterol: 40 mg/dL     Total Cholesterol: 163 mg/dL Using medications without problems: Muscle aches:  Diet compliance:keto diet., moderate Exercise:  walking daily when traveling, trying to do stairs at work. Other complaints:  Hypertension:   Borderline control in office today on exforge 10/160 mg daily, metoprolol 100 mg Xl daily BP Readings from Last 3 Encounters:  05/07/21 130/90  03/04/21 120/84  02/23/21 138/76  Using medication without problems or lightheadedness: none Chest pain with exertion: none Edema:none Short of breath: none Average home BPs: 120/80-90s Other issues:  occ heart racing  Prediabetes Lab Results  Component Value Date   HGBA1C 5.7 01/01/2021   Wt Readings from Last 3 Encounters:  05/07/21 158 lb 4 oz (71.8 kg)  03/04/21 159 lb 8 oz (72.3 kg)  02/23/21 161 lb (73 kg)   Body mass index is 27.81 kg/m.  MDD: Stable control on lamictal, cytomel, Ambien followed by psychiatry.  PHQ9:9  B12 deficiency      Relevant past medical, surgical, family and social history reviewed and updated as indicated. Interim medical history since our last visit reviewed. Allergies and medications reviewed and updated. Outpatient Medications Prior to Visit  Medication Sig Dispense Refill   amLODipine-valsartan (EXFORGE) 10-160 MG tablet TAKE 1 TABLET BY MOUTH EVERY DAY FOR BLOOD PRESSURE 90 tablet 1   DENTA 5000 PLUS 1.1 % CREA dental cream      estradiol (VIVELLE-DOT) 0.05 MG/24HR patch 1 patch 2 (two) times a week.     lamoTRIgine (LAMICTAL) 100 MG tablet Take 200 mg by mouth daily.     levonorgestrel (MIRENA) 20 MCG/24HR IUD 1 each by Intrauterine route once.     liothyronine (CYTOMEL) 25 MCG tablet Take 25 mcg by mouth every morning.     Magnesium Citrate 100 MG CAPS Take 300 mg by mouth daily.     METAMUCIL FIBER PO Take 2 Scoops by mouth daily.     metoprolol succinate (TOPROL-XL) 100 MG 24 hr tablet TAKE 1 TABLET BY MOUTH EVERY DAY WITH OR IMMEDIATELY FOLLOWING A MEAL 90 tablet 1   omeprazole (PRILOSEC) 20 MG capsule Take 20 mg by mouth daily.     zolpidem (AMBIEN) 10  MG tablet Take 5 mg by mouth at bedtime as needed for sleep.     hydrocortisone (ANUSOL-HC) 2.5 % rectal cream Place 1 application rectally 2 (two) times daily. 30 g 0   Multiple Vitamin (MULTI-VITAMINS) TABS Take 1 tablet by mouth daily.     No facility-administered medications prior to visit.     Per HPI unless specifically indicated in ROS section below Review of Systems Objective:  BP 130/90   Pulse 70   Temp 97.9 F (36.6 C) (Temporal)   Ht 5' 3.25" (1.607 m)   Wt 158 lb 4 oz (71.8 kg)   SpO2 97%   BMI 27.81 kg/m   Wt Readings from Last 3 Encounters:  05/07/21 158 lb 4 oz (71.8 kg)  03/04/21 159 lb 8 oz (72.3 kg)  02/23/21 161 lb (73 kg)      Physical Exam    Results for orders placed or performed in visit on 03/04/21  Comprehensive metabolic panel  Result Value Ref Range   Sodium 140 135 - 145 mEq/L   Potassium  4.1 3.5 - 5.1 mEq/L   Chloride 103 96 - 112 mEq/L   CO2 30 19 - 32 mEq/L   Glucose, Bld 92 70 - 99 mg/dL   BUN 17 6 - 23 mg/dL   Creatinine, Ser 0.66 0.40 - 1.20 mg/dL   Total Bilirubin 0.8 0.2 - 1.2 mg/dL   Alkaline Phosphatase 102 39 - 117 U/L   AST 20 0 - 37 U/L   ALT 19 0 - 35 U/L   Total Protein 7.6 6.0 - 8.3 g/dL   Albumin 4.3 3.5 - 5.2 g/dL   GFR 97.31 >60.00 mL/min   Calcium 10.7 (H) 8.4 - 10.5 mg/dL  Hepatitis panel, acute  Result Value Ref Range   Hep A IgM NON-REACTIVE NON-REACTIVE   Hepatitis B Surface Ag NON-REACTIVE NON-REACTIVE   Hep B C IgM NON-REACTIVE NON-REACTIVE   Hepatitis C Ab NON-REACTIVE NON-REACTIVE   SIGNAL TO CUT-OFF 0.01 <1.00  CBC with Differential/Platelet  Result Value Ref Range   WBC 6.1 4.0 - 10.5 K/uL   RBC 4.41 3.87 - 5.11 Mil/uL   Hemoglobin 12.3 12.0 - 15.0 g/dL   HCT 37.6 36.0 - 46.0 %   MCV 85.2 78.0 - 100.0 fl   MCHC 32.7 30.0 - 36.0 g/dL   RDW 13.8 11.5 - 15.5 %   Platelets 311.0 150.0 - 400.0 K/uL   Neutrophils Relative % 56.6 43.0 - 77.0 %   Lymphocytes Relative 36.7 12.0 - 46.0 %   Monocytes Relative 5.7 3.0 - 12.0 %   Eosinophils Relative 0.6 0.0 - 5.0 %   Basophils Relative 0.4 0.0 - 3.0 %   Neutro Abs 3.4 1.4 - 7.7 K/uL   Lymphs Abs 2.2 0.7 - 4.0 K/uL   Monocytes Absolute 0.3 0.1 - 1.0 K/uL   Eosinophils Absolute 0.0 0.0 - 0.7 K/uL   Basophils Absolute 0.0 0.0 - 0.1 K/uL  HM PAP SMEAR  Result Value Ref Range   HM Pap smear See report in Care Everywhere   Results Console HPV  Result Value Ref Range   CHL HPV Negative     This visit occurred during the SARS-CoV-2 public health emergency.  Safety protocols were in place, including screening questions prior to the visit, additional usage of staff PPE, and extensive cleaning of exam room while observing appropriate contact time as indicated for disinfecting solutions.   COVID 19 screen:  No recent travel or known exposure  to COVID19 The patient denies respiratory symptoms  of COVID 19 at this time. The importance of social distancing was discussed today.   Assessment and Plan The patient's preventative maintenance and recommended screening tests for an annual wellness exam were reviewed in full today. Brought up to date unless services declined.  Counselled on the importance of diet, exercise, and its role in overall health and mortality. The patient's FH and SH was reviewed, including their home life, tobacco status, and drug and alcohol status.    Vaccines: uptodate flu, Td, shingrix and COVID x 3, Mammo: 03/30/2020 repeat in 1 year ( had 05/06/2021 results pending.. hysteroscopy scheduled in 06/2021 Colon: 11/13/2019, father with colon cancer , plan repeat in 3-5 years Smoking Status: nonsmoker  ETOH; social  Hep C:  done  HIV screen:   refused  Problem List Items Addressed This Visit     Hypercholesterolemia (Chronic)     LDL almost at goal < 100. Encouraged exercise, weight loss, healthy eating habits.       Relevant Orders   Comprehensive metabolic panel   Lipid panel   Hypertension (Chronic)    Borderline control in office today on exforge 10/160 mg daily, metoprolol 100 mg Xl daily      Moderate recurrent major depression (HCC) (Chronic)    Stable, chronic.  Continue current medication.    Followed by psychiatry      Prediabetes (Chronic)    Diet controlled.      Relevant Orders   Hemoglobin A1c   B12 deficiency   Relevant Orders   Vitamin B12   Other Visit Diagnoses     Routine general medical examination at a health care facility    -  Primary   Vagina bleeding       Relevant Orders   CBC with Differential/Platelet       Orders Placed This Encounter  Procedures   Comprehensive metabolic panel   Hemoglobin A1c   Lipid panel   Vitamin B12   CBC with Differential/Platelet    Eliezer Lofts, MD

## 2021-05-07 NOTE — Patient Instructions (Addendum)
Please stop at the lab to have labs drawn.  Keep up the good work on lifestyle changes.

## 2021-05-07 NOTE — Assessment & Plan Note (Signed)
Borderline control in office today on exforge 10/160 mg daily, metoprolol 100 mg Xl daily

## 2021-05-07 NOTE — Assessment & Plan Note (Signed)
LDL almost at goal < 100. Encouraged exercise, weight loss, healthy eating habits.

## 2021-05-07 NOTE — Assessment & Plan Note (Signed)
Diet controlled.  

## 2021-05-07 NOTE — Assessment & Plan Note (Signed)
Stable, chronic.  Continue current medication.    Followed by psychiatry

## 2021-05-21 ENCOUNTER — Telehealth: Payer: Self-pay

## 2021-05-21 ENCOUNTER — Telehealth: Payer: Self-pay | Admitting: Family Medicine

## 2021-05-21 DIAGNOSIS — N6315 Unspecified lump in the right breast, overlapping quadrants: Secondary | ICD-10-CM | POA: Diagnosis not present

## 2021-05-21 NOTE — Telephone Encounter (Signed)
Pt called wanting to know the results of her mammogram. Pt would like a call back.

## 2021-05-21 NOTE — Telephone Encounter (Signed)
Brandi Terrell at [facility] radiology called report on diagnostic mammogram and [name]; new irregular nodule right breast at 6:00 position.Moderately suspicious for malignancy. Needs US guided biopsy. Pt is aware of results and request cb from Dr Diona Browner to discuss findings and to possibly go to [facility] for biopsy. Brandi Terrell said if not receive faxed report call Brandi Terrell at [phone]. Pt is not waiting. Sending note to Dr Diona Browner and Butch Penny CMA and will teams Butch Penny.

## 2021-05-23 NOTE — Telephone Encounter (Signed)
Per phone encounter from [place].  Patient is aware of results and is requesting call from Dr. Diona Browner to discuss.

## 2021-05-24 ENCOUNTER — Other Ambulatory Visit: Payer: Self-pay | Admitting: Family Medicine

## 2021-05-24 ENCOUNTER — Encounter: Payer: Self-pay | Admitting: Family Medicine

## 2021-05-24 NOTE — Telephone Encounter (Signed)
I called on 05/21/2021 to answer pt questions. Left a message as no answer.  Please let her know I will call her  again when I am in office tommorow.

## 2021-05-24 NOTE — Telephone Encounter (Signed)
Dr. Diona Browner responded to patient's MyChart message that she would call her tomorrow when she was back in the office.

## 2021-05-25 ENCOUNTER — Encounter: Payer: Self-pay | Admitting: Family Medicine

## 2021-05-25 NOTE — Telephone Encounter (Signed)
Spoke with [facility] about patients right breast nodule seen on diagnositic mammography at [facility].... [phone] about setting pt up with US guided biopsy at [facility]. Benjamine Mola offered to speak with pt as well. Asked Ashtyn, referral coordinator to call to begin the process of record transfer. Please let me know what order is needed.   Called pt to let her know this is in process... she was anxious about the time it make take to set up US guided biopsy at [facility]. But still prefers [facility] given her GYN is there. She will call Benjamine Mola to discuss the length of time of the process and have questions answered.

## 2021-05-25 NOTE — Telephone Encounter (Signed)
Pt call in would like a call back regarding a referral for a Breast biopsy . Please advise [phone]

## 2021-05-26 DIAGNOSIS — N6315 Unspecified lump in the right breast, overlapping quadrants: Secondary | ICD-10-CM | POA: Diagnosis not present

## 2021-05-26 NOTE — Telephone Encounter (Signed)
Called [facility] as Dr Diona Browner requested.  Spoke with Benjamine Mola, She is going to call [place] and have them send the previous testing/records, once they upload the records to their system they will send them to the MD to review, they will then determine what testing needs to be done and they ([facility]) will call the patient to schedule her appointment. They took a verbal order for whatever test they end up needing (US Guided Bx) and will order it under your Dr Diona Browner as ordering Provider.   they will call the office (doc line [phone]) if they have any further questions or concerns.   Dr Diona Browner, your name has been attached to anything ordered so you will receive notification if results.

## 2021-05-28 ENCOUNTER — Encounter: Payer: Self-pay | Admitting: Family Medicine

## 2021-05-28 ENCOUNTER — Other Ambulatory Visit: Payer: Self-pay | Admitting: Family Medicine

## 2021-05-28 MED ORDER — ALPRAZOLAM 0.5 MG PO TABS: 0.5000 mg | ORAL_TABLET | Freq: Once | ORAL | 0 refills | Status: DC | PRN

## 2021-05-28 NOTE — Progress Notes (Signed)
Sent rx for alprazolam

## 2021-06-01 DIAGNOSIS — N6315 Unspecified lump in the right breast, overlapping quadrants: Secondary | ICD-10-CM | POA: Diagnosis not present

## 2021-06-01 DIAGNOSIS — N6459 Other signs and symptoms in breast: Secondary | ICD-10-CM | POA: Diagnosis not present

## 2021-06-01 DIAGNOSIS — N62 Hypertrophy of breast: Secondary | ICD-10-CM | POA: Diagnosis not present

## 2021-06-01 DIAGNOSIS — D241 Benign neoplasm of right breast: Secondary | ICD-10-CM | POA: Diagnosis not present

## 2021-06-07 ENCOUNTER — Other Ambulatory Visit: Payer: Self-pay | Admitting: Family Medicine

## 2021-06-07 DIAGNOSIS — N840 Polyp of corpus uteri: Secondary | ICD-10-CM | POA: Diagnosis not present

## 2021-06-08 ENCOUNTER — Telehealth: Payer: Self-pay | Admitting: Family Medicine

## 2021-06-08 DIAGNOSIS — G4733 Obstructive sleep apnea (adult) (pediatric): Secondary | ICD-10-CM | POA: Diagnosis not present

## 2021-06-08 DIAGNOSIS — I10 Essential (primary) hypertension: Secondary | ICD-10-CM | POA: Diagnosis not present

## 2021-06-08 DIAGNOSIS — F32A Depression, unspecified: Secondary | ICD-10-CM | POA: Diagnosis not present

## 2021-06-08 DIAGNOSIS — E2839 Other primary ovarian failure: Secondary | ICD-10-CM | POA: Diagnosis not present

## 2021-06-08 NOTE — Telephone Encounter (Signed)
My chart note sent.

## 2021-06-22 ENCOUNTER — Telehealth: Payer: Self-pay | Admitting: Family Medicine

## 2021-06-23 ENCOUNTER — Telehealth (INDEPENDENT_AMBULATORY_CARE_PROVIDER_SITE_OTHER): Payer: BC Managed Care – PPO | Admitting: Family Medicine

## 2021-06-23 ENCOUNTER — Other Ambulatory Visit: Payer: Self-pay

## 2021-06-23 ENCOUNTER — Encounter: Payer: Self-pay | Admitting: Family Medicine

## 2021-06-23 DIAGNOSIS — J209 Acute bronchitis, unspecified: Secondary | ICD-10-CM | POA: Insufficient documentation

## 2021-06-23 MED ORDER — PREDNISONE 10 MG PO TABS: ORAL_TABLET | ORAL | 0 refills | Status: DC

## 2021-06-23 NOTE — Patient Instructions (Signed)
Drink fluids and rest  mucinex DM is good for cough and congestion  Nasal saline for congestion as needed  Tylenol for fever or pain or headache  Please alert [name] if symptoms worsen (if severe or short of breath please go to the ER)   Take the prednisone as directed for bronchitis

## 2021-06-23 NOTE — Progress Notes (Signed)
Virtual Visit via Video Note  I connected with Brandi Terrell on 06/23/21 at 11:30 AM EST by a video enabled telemedicine application and verified that I am speaking with the correct person using two identifiers.  Location: Patient: work in [employer]: office    I discussed the limitations of evaluation and management by telemedicine and the availability of in person appointments. The patient expressed understanding and agreed to proceed.  Parties involved in encounter  Patient: Brandi Terrell  Provider:  Loura Pardon MD   History of Present Illness: 56 yo pt of Dr Diona Browner presents with cough and congestion   Friday started a sore throat  Ears burning sensation/tried to pop them  Then a deep chest type of cough  Feels uncomfortable to cough  No wheezing   Productive of phlegm- light yellow/tan Now some nasal congestion and sinus pressure and pain  Felt chills one night - that was it  Tired  No body aches  Hoarse    Covid test was negative   Has taken prednisone in the past   Otc Alcohol drops in ears  Cough syrup - DM    Patient Active Problem List   Diagnosis Date Noted   Acute bronchitis 06/23/2021   Post-menopause 03/23/2021   External hemorrhoids 03/23/2021   Hot flashes, menopausal 03/04/2021   Congestion of nasal sinus 03/04/2021   Elevated LFTs 02/25/2021   Constipation 01/01/2021   Left Achilles tendinitis 08/21/2020   Urinary frequency 08/21/2020   Increased thirst 08/21/2020   Hypercholesterolemia 04/26/2019   Situational mixed anxiety and depressive disorder 03/01/2019   Epistaxis 03/01/2019   History of basal cell cancer 01/26/2017   GAD (generalized anxiety disorder) 01/26/2017   Chronic insomnia 01/26/2017   Hypertension 01/26/2017   Chronic GERD 01/26/2017   Family history of colon cancer 01/26/2017   Prediabetes 01/26/2017   Perineal pain in female, chronic 01/26/2017   Moderate recurrent major depression ([place]) 11/03/2016    B12 deficiency 10/24/2016   Obstructive sleep apnea 11/02/2015   H/O adenomatous polyp of colon 11/02/2015   Basal cell carcinoma of skin of other parts of face 06/24/2014   Relaxation of external anal sphincter 06/24/2014   Past Medical History:  Diagnosis Date   Acid reflux    Basal cell carcinoma (BCC) of face    Depression    Hypertension    Inflammatory polyps of colon (HCC)    Sleep apnea    Past Surgical History:  Procedure Laterality Date   APPENDECTOMY  1976   CHOLECYSTECTOMY  2005   Social History   Tobacco Use   Smoking status: Never   Smokeless tobacco: Never  Vaping Use   Vaping Use: Never used  Substance Use Topics   Alcohol use: Yes    Comment: occ   Drug use: No   Family History  Problem Relation Age of Onset   Hypertension Mother    Depression Mother    Skin cancer Father    Hypertension Father    Depression Father    Cancer Father 69        colon   Stroke Maternal Grandmother    Stroke Paternal Grandmother    Heart disease Paternal Grandmother    Hypertension Paternal Grandmother    Cancer Maternal Grandfather        leukemia   Stroke Maternal Grandfather    Stroke Paternal Grandfather    Allergies  Allergen Reactions   Sulfa Antibiotics Hives   Levofloxacin Diarrhea   Tramadol  Hives   Mirabegron Other (See Comments)    Blurred vision   Cephalosporins Itching and Rash   Current Outpatient Medications on File Prior to Visit  Medication Sig Dispense Refill   amLODipine-valsartan (EXFORGE) 10-160 MG tablet TAKE 1 TABLET BY MOUTH EVERY DAY FOR BLOOD PRESSURE 90 tablet 1   DENTA 5000 PLUS 1.1 % CREA dental cream      estradiol (VIVELLE-DOT) 0.05 MG/24HR patch 1 patch 2 (two) times a week.     lamoTRIgine (LAMICTAL) 100 MG tablet Take 200 mg by mouth daily.     levonorgestrel (MIRENA) 20 MCG/24HR IUD 1 each by Intrauterine route once.     liothyronine (CYTOMEL) 25 MCG tablet Take 25 mcg by mouth every morning.     Magnesium Citrate 100 MG  CAPS Take 300 mg by mouth daily.     METAMUCIL FIBER PO Take 2 Scoops by mouth daily.     metoprolol succinate (TOPROL-XL) 100 MG 24 hr tablet TAKE 1 TABLET BY MOUTH EVERY DAY WITH OR IMMEDIATELY FOLLOWING A MEAL 90 tablet 3   omeprazole (PRILOSEC) 20 MG capsule Take 20 mg by mouth daily.     zolpidem (AMBIEN) 10 MG tablet Take 5 mg by mouth at bedtime as needed for sleep.     No current facility-administered medications on file prior to visit.   Review of Systems  Constitutional:  Positive for malaise/fatigue. Negative for chills and fever.  HENT:  Positive for congestion, sinus pain and sore throat. Negative for ear pain.   Eyes:  Negative for blurred vision, discharge and redness.  Respiratory:  Positive for cough, sputum production and shortness of breath. Negative for wheezing and stridor.   Cardiovascular:  Negative for chest pain, palpitations and leg swelling.  Gastrointestinal:  Negative for abdominal pain, diarrhea, nausea and vomiting.  Musculoskeletal:  Negative for myalgias.  Skin:  Negative for rash.  Neurological:  Negative for dizziness and headaches.   Observations/Objective: Patient appears well, in no distress Weight is baseline  No facial swelling or asymmetry Hoarse voice No obvious tremor or mobility impairment Moving neck and UEs normally Able to hear the call well  No wheeze or shortness of breath during interview  Can speak full sentences  Frequent barky cough noted  Talkative and mentally sharp with no cognitive changes No skin changes on face or neck , no rash or pallor Affect is normal    Assessment and Plan: Problem List Items Addressed This Visit       Respiratory   Acute bronchitis - Primary    Viral/ day 5 with productive cough and negative covid testing  Doubt influenza due to lack of fever  Discussed symptom control  Expectorant DM and nasal saline Prednisone 40 mg taper sent to pharmacy for chest tightness and congestion  Disc possible  side effects/she has tolerated before  ER precautions discussed Update if not starting to improve in a week or if worsening   Will watch for sinus pain or fever        Follow Up Instructions: Drink fluids and rest  mucinex DM is good for cough and congestion  Nasal saline for congestion as needed  Tylenol for fever or pain or headache  Please alert [name] if symptoms worsen (if severe or short of breath please go to the ER)   Take the prednisone as directed for bronchitis    I discussed the assessment and treatment plan with the patient. The patient was provided an opportunity to ask questions  and all were answered. The patient agreed with the plan and demonstrated an understanding of the instructions.   The patient was advised to call back or seek an in-person evaluation if the symptoms worsen or if the condition fails to improve as anticipated.    Loura Pardon, MD

## 2021-06-23 NOTE — Assessment & Plan Note (Signed)
Viral/ day 5 with productive cough and negative covid testing  Doubt influenza due to lack of fever  Discussed symptom control  Expectorant DM and nasal saline Prednisone 40 mg taper sent to pharmacy for chest tightness and congestion  Disc possible side effects/she has tolerated before  ER precautions discussed Update if not starting to improve in a week or if worsening   Will watch for sinus pain or fever

## 2021-06-25 ENCOUNTER — Encounter: Payer: Self-pay | Admitting: Family Medicine

## 2021-06-25 ENCOUNTER — Ambulatory Visit
Admission: EM | Admit: 2021-06-25 | Discharge: 2021-06-25 | Disposition: A | Discharge status: Home / Self Care | Payer: BC Managed Care – PPO | Attending: Family Medicine | Admitting: Family Medicine

## 2021-06-25 ENCOUNTER — Other Ambulatory Visit: Payer: Self-pay | Admitting: Family Medicine

## 2021-06-25 ENCOUNTER — Encounter: Payer: Self-pay | Admitting: Emergency Medicine

## 2021-06-25 DIAGNOSIS — H10533 Contact blepharoconjunctivitis, bilateral: Secondary | ICD-10-CM | POA: Diagnosis not present

## 2021-06-25 DIAGNOSIS — J069 Acute upper respiratory infection, unspecified: Secondary | ICD-10-CM

## 2021-06-25 LAB — POCT INFLUENZA A/B
Influenza A, POC: NEGATIVE
Influenza B, POC: NEGATIVE

## 2021-06-25 MED ORDER — PROMETHAZINE-DM 6.25-15 MG/5ML PO SYRP: 5.0000 mL | ORAL_SOLUTION | Freq: Four times a day (QID) | ORAL | 0 refills | Status: DC | PRN

## 2021-06-25 MED ORDER — POLYMYXIN B-TRIMETHOPRIM 10000-0.1 UNIT/ML-% OP SOLN: 2.0000 [drp] | Freq: Three times a day (TID) | OPHTHALMIC | 0 refills | Status: AC

## 2021-06-25 NOTE — Telephone Encounter (Signed)
I spoke with Brandi Terrell;Brandi Terrell said the prod cough with beige,green phlegm has worsened and more  persistent; Brandi Terrell having problems sleeping due to cough. Pts lt eye is oozing discharge and is red and painful. Brandi Terrell has heavy feeling in chest and Brandi Terrell thinks due to coughing. Brandi Terrell is going to [facility] for in person eval and possible CXR if needed. Sending note to Dr Diona Browner and Butch Penny CMA. Per chart review tab Brandi Terrell is at [facility].

## 2021-06-25 NOTE — Telephone Encounter (Signed)
Returned patient's phone call.  See telephone encounter.

## 2021-06-25 NOTE — ED Provider Notes (Signed)
[name]    CSN: [id number] Arrival date & time: 06/25/21  1018      History   Chief Complaint Chief Complaint  Patient presents with   Cough   Nasal Congestion   Sore Throat   Otalgia   Eye Problem    HPI Brandi Terrell is a 56 y.o. female.   HPI Patient presents with URI symptoms including cough, sore throat, otalgia, nasal congestion, runny nose, and sinus pressure. Unknown of COVID exposure. Symptoms present for one week. Unknown if fever. Negative home COVID test 3 days ago. Subsequently developed purulent, red, itchy eyes x 2 days and which have worsened. Reports a history of complicated case of Conjuctivitis two years ago. Denies changes in visual acuity . Denies worrisome symptoms of shortness of breath, or chest pain. Had an E-visit is currently taking prednisone, on day 3 of 5 day course. Past Medical History:  Diagnosis Date   Acid reflux    Basal cell carcinoma (BCC) of face    Depression    Hypertension    Inflammatory polyps of colon [facility])    Sleep apnea     Patient Active Problem List   Diagnosis Date Noted   Acute bronchitis 06/23/2021   Post-menopause 03/23/2021   External hemorrhoids 03/23/2021   Hot flashes, menopausal 03/04/2021   Congestion of nasal sinus 03/04/2021   Elevated LFTs 02/25/2021   Constipation 01/01/2021   Left Achilles tendinitis 08/21/2020   Urinary frequency 08/21/2020   Increased thirst 08/21/2020   Hypercholesterolemia 04/26/2019   Situational mixed anxiety and depressive disorder 03/01/2019   Epistaxis 03/01/2019   History of basal cell cancer 01/26/2017   GAD (generalized anxiety disorder) 01/26/2017   Chronic insomnia 01/26/2017   Hypertension 01/26/2017   Chronic GERD 01/26/2017   Family history of colon cancer 01/26/2017   Prediabetes 01/26/2017   Perineal pain in female, chronic 01/26/2017   Moderate recurrent major depression ([place]) 11/03/2016   B12 deficiency 10/24/2016   Obstructive sleep apnea  11/02/2015   H/O adenomatous polyp of colon 11/02/2015   Basal cell carcinoma of skin of other parts of face 06/24/2014   Relaxation of external anal sphincter 06/24/2014    Past Surgical History:  Procedure Laterality Date   APPENDECTOMY  1976   CHOLECYSTECTOMY  2005    OB History   No obstetric history on file.      Home Medications    Prior to Admission medications   Medication Sig Start Date End Date Taking? Authorizing Provider  promethazine-dextromethorphan (PROMETHAZINE-DM) 6.25-15 MG/5ML syrup Take 5 mLs by mouth 4 (four) times daily as needed for cough. 06/25/21  Yes Scot Jun, FNP  trimethoprim-polymyxin b (POLYTRIM) ophthalmic solution Place 2 drops into both eyes in the morning, at noon, and at bedtime for 10 days. 06/25/21 07/05/21 Yes Scot Jun, FNP  amLODipine-valsartan (EXFORGE) 10-160 MG tablet TAKE 1 TABLET BY MOUTH EVERY DAY FOR BLOOD PRESSURE 02/08/21   Jinny Sanders, MD  DENTA 5000 PLUS 1.1 % CREA dental cream  10/30/20   [provider]  estradiol (VIVELLE-DOT) 0.05 MG/24HR patch 1 patch 2 (two) times a week. 02/10/21   [provider]  lamoTRIgine (LAMICTAL) 100 MG tablet Take 200 mg by mouth daily.    [provider]  levonorgestrel (MIRENA) 20 MCG/24HR IUD 1 each by Intrauterine route once.    [provider]  liothyronine (CYTOMEL) 25 MCG tablet Take 25 mcg by mouth every morning. 02/19/20   [provider]  Magnesium Citrate 100 MG CAPS Take 300 mg by mouth daily.    [provider]  METAMUCIL FIBER PO Take 2 Scoops by mouth daily.    [provider]  metoprolol succinate (TOPROL-XL) 100 MG 24 hr tablet TAKE 1 TABLET BY MOUTH EVERY DAY WITH OR IMMEDIATELY FOLLOWING A MEAL 06/07/21   Bedsole, Amy E, MD  omeprazole (PRILOSEC) 20 MG capsule Take 20 mg by mouth daily.    [provider]  predniSONE (DELTASONE) 10 MG tablet Take 4 pills once daily by mouth for 3 days, then 3 pills  daily for 3 days, then 2 pills daily for 3 days then 1 pill daily for 3 days then stop 06/23/21   Tower, Wynelle Fanny, MD  zolpidem (AMBIEN) 10 MG tablet Take 5 mg by mouth at bedtime as needed for sleep.    [provider]    Family History Family History  Problem Relation Age of Onset   Hypertension Mother    Depression Mother    Skin cancer Father    Hypertension Father    Depression Father    Cancer Father 68        colon   Stroke Maternal Grandmother    Stroke Paternal Grandmother    Heart disease Paternal Grandmother    Hypertension Paternal Grandmother    Cancer Maternal Grandfather        leukemia   Stroke Maternal Grandfather    Stroke Paternal Grandfather     Social History Social History   Tobacco Use   Smoking status: Never   Smokeless tobacco: Never  Vaping Use   Vaping Use: Never used  Substance Use Topics   Alcohol use: Yes    Comment: occ   Drug use: No     Allergies   Sulfa antibiotics, Levofloxacin, Tramadol, Mirabegron, and Cephalosporins   Review of Systems Review of Systems Pertinent negatives listed in HPI  Physical Exam Triage Vital Signs ED Triage Vitals  Enc Vitals Group     BP 06/25/21 1132 (!) 144/80     Pulse Rate 06/25/21 1132 83     Resp 06/25/21 1132 16     Temp 06/25/21 1132 98.7 F (37.1 C)     Temp Source 06/25/21 1132 Oral     SpO2 06/25/21 1132 96 %     Weight --      Height --      Head Circumference --      Peak Flow --      Pain Score 06/25/21 1141 0     Pain Loc --      Pain Edu? --      Excl. in [place]? --    No data found.  Updated Vital Signs BP (!) 144/80 (BP Location: Left Arm)    Pulse 83    Temp 98.7 F (37.1 C) (Oral)    Resp 16    LMP  (LMP Unknown)    SpO2 96%   Visual Acuity Right Eye Distance:   Left Eye Distance:   Bilateral Distance:    Right Eye Near:   Left Eye Near:    Bilateral Near:     Physical Exam  General Appearance:    Alert, cooperative, no distress  HENT:    Normocephalic, ears normal, nares mucosal edema with congestion, rhinorrhea, oropharynx  patent with palpable neck nodes   Eyes:    PERRLA, purulent discharge with erythematous conjunctiva/corneas, crusting present eye lashes U/L EOM's intact  Lungs:     Clear to auscultation bilaterally, respirations unlabored  Heart:    Regular rate and rhythm  Neurologic:   Awake, alert, oriented x 3. No apparent focal neurological           defect.      UC Treatments / Results  Labs (all labs ordered are listed, but only abnormal results are displayed) Labs Reviewed  POCT INFLUENZA A/B - Normal    EKG   Radiology No results found.  Procedures Procedures (including critical care time)  Medications Ordered in UC Medications - No data to display  Initial Impression / Assessment and Plan / UC Course  I have reviewed the triage vital signs and the nursing notes.  Pertinent labs & imaging results that were available during my care of the patient were reviewed by me and considered in my medical decision making (see chart for details).       Contact blepharoconjunctivitis of both eyes and viral URI  Continue prednisone. Start Polytrim TID both eyes x 10 days Promethazine DM for cough RTC PRN Final Clinical Impressions(s) / UC Diagnoses   Final diagnoses:  Contact blepharoconjunctivitis of both eyes  Viral upper respiratory illness     Discharge Instructions      Recommend resting for COVID at home. Continue prednisone and complete the entire course of medication. Take medication as prescribed. Return here if needed or follow-up with PCP.     ED Prescriptions     Medication Sig Dispense Auth. Provider   trimethoprim-polymyxin b (POLYTRIM) ophthalmic solution Place 2 drops into both eyes in the morning, at noon, and at bedtime for 10 days. 10 mL Scot Jun, FNP   promethazine-dextromethorphan (PROMETHAZINE-DM) 6.25-15 MG/5ML syrup Take 5 mLs by mouth 4 (four)  times daily as needed for cough. 180 mL Scot Jun, FNP      PDMP not reviewed this encounter.   Scot Jun, FNP 06/25/21 1220

## 2021-06-25 NOTE — Telephone Encounter (Signed)
[facility] Night - Client Nonclinical Telephone Record  [facility] Client [facility] Night - Client Client Site [place] - Night Provider Eliezer Lofts - MD Contact Type Call Who Is Calling Patient / Member / Family / Caregiver Caller Name Rexanne Inocencio Caller Phone Number [phone] Patient Name Brandi Terrell Patient DOB May 01, 1966 Call Type Message Only Information Provided Reason for Call Request for General Office Information Initial Comment Caller states that she wanted to speak to her doctor's nurse. She is not progressing as well as they thought that she should be so she would like to make sure and talk to someone this morning. Additional Comment Caller declined triage and stated that she would just like a call back from the nurse at the office if possible since she is not better like the doctor though she would be by now. Disp. Time Disposition Final User 06/25/2021 7:40:47 AM General Information Provided Yes Wynema Birch Call Closed By: Wynema Birch Transaction Date/Time: 06/25/2021 7:37:38 AM (ET

## 2021-06-25 NOTE — Telephone Encounter (Signed)
Pt called asking for a call back. Pt states that she has some questions about medication promethazine-dextromethorphan (PROMETHAZINE-DM) 6.25-15 MG/5ML syrup. Please advise.

## 2021-06-25 NOTE — Telephone Encounter (Signed)
Spoke with Brandi Terrell.  She was seen at the Urgent Care today and was given Promethazine-DM.  She states in the past she was given Hycodan and is asking if Dr. Diona Browner would send her in a Rx for that cough syrup instead. She states she really needs to get some rest and would be appreciative if Dr. Diona Browner would send a small amount for at least a couple of night.  Patient uses [facility] on [address].    Brandi Terrell was also prescribed polytrim eye drops.  She also didn't think this was what she has used in the past and just wants to make sure it is okay to use with her medication allergies.    Please advise.

## 2021-06-25 NOTE — Discharge Instructions (Signed)
Recommend resting for COVID at home. Continue prednisone and complete the entire course of medication. Take medication as prescribed. Return here if needed or follow-up with PCP.

## 2021-06-25 NOTE — ED Triage Notes (Signed)
Pt here with productive cough, sore throat, ear pain and left eye redness and drainage x 1 week. Was seen for an e-visit on Wednesday and was given prednisone and is on day 3 of dose pak.

## 2021-06-25 NOTE — Telephone Encounter (Signed)
Sent in hycodan cough syrup.   No contraindication/allergy to polytrim eye drops.. does not contain sulfa or cephalosporin. Okay to use.

## 2021-06-25 NOTE — Telephone Encounter (Signed)
Noted  

## 2021-06-25 NOTE — Telephone Encounter (Signed)
Brandi Terrell notified Hycodan cough syrup has been sent to her pharmacy.  No contraindication/allergy to eye drops.  Okay to use per Dr. Diona Browner.

## 2021-06-28 ENCOUNTER — Other Ambulatory Visit: Payer: Self-pay | Admitting: Family Medicine

## 2021-06-28 ENCOUNTER — Encounter: Payer: Self-pay | Admitting: Family Medicine

## 2021-06-28 DIAGNOSIS — Z713 Dietary counseling and surveillance: Secondary | ICD-10-CM | POA: Diagnosis not present

## 2021-06-28 DIAGNOSIS — I10 Essential (primary) hypertension: Secondary | ICD-10-CM | POA: Diagnosis not present

## 2021-06-28 DIAGNOSIS — G4733 Obstructive sleep apnea (adult) (pediatric): Secondary | ICD-10-CM | POA: Diagnosis not present

## 2021-06-28 DIAGNOSIS — E2839 Other primary ovarian failure: Secondary | ICD-10-CM | POA: Diagnosis not present

## 2021-06-28 MED ORDER — HYDROCODONE BIT-HOMATROP MBR 5-1.5 MG/5ML PO SOLN: 5.0000 mL | Freq: Three times a day (TID) | ORAL | 0 refills | Status: DC | PRN

## 2021-06-28 NOTE — Progress Notes (Signed)
Meds ordered this encounter  Medications   HYDROcodone bit-homatropine (HYCODAN) 5-1.5 MG/5ML syrup    Sig: Take 5 mLs by mouth every 8 (eight) hours as needed for cough.    Dispense:  120 mL    Refill:  0

## 2021-06-29 MED ORDER — HYDROCODONE BIT-HOMATROP MBR 5-1.5 MG/5ML PO SOLN: 5.0000 mL | Freq: Three times a day (TID) | ORAL | 0 refills | Status: DC | PRN

## 2021-07-01 ENCOUNTER — Ambulatory Visit
Admission: RE | Admit: 2021-07-01 | Discharge: 2021-07-01 | Disposition: A | Discharge status: Home / Self Care | Payer: BC Managed Care – PPO | Source: Ambulatory Visit

## 2021-07-01 ENCOUNTER — Ambulatory Visit (INDEPENDENT_AMBULATORY_CARE_PROVIDER_SITE_OTHER): Payer: BC Managed Care – PPO

## 2021-07-01 VITALS — BP 132/85 | HR 67 | Temp 99.0°F | Resp 16

## 2021-07-01 DIAGNOSIS — R059 Cough, unspecified: Secondary | ICD-10-CM

## 2021-07-01 DIAGNOSIS — J209 Acute bronchitis, unspecified: Secondary | ICD-10-CM

## 2021-07-01 MED ORDER — ALBUTEROL SULFATE HFA 108 (90 BASE) MCG/ACT IN AERS: 2.0000 | INHALATION_SPRAY | RESPIRATORY_TRACT | 0 refills | Status: DC | PRN

## 2021-07-01 MED ORDER — AZITHROMYCIN 250 MG PO TABS: ORAL_TABLET | ORAL | 0 refills | Status: DC

## 2021-07-01 NOTE — Discharge Instructions (Addendum)
Your chest xray is normal Finish the prednisone and start the new medications today.

## 2021-07-01 NOTE — ED Triage Notes (Signed)
Pt presents with cough x 2 weeks and chest congestion. Pt is currently on prednisone and Hycodan cough syrup.

## 2021-07-01 NOTE — ED Provider Notes (Signed)
Brandi Terrell    CSN: [id number] Arrival date & time: 07/01/21  0850      History   Chief Complaint Chief Complaint  Patient presents with   Cough    HPI Brandi Terrell is a 56 y.o. female who presents with cough x 2 weeks. Her illness started with a cough and started having nose congestion a few days ago. Had bacterial conjunctivitis and seen last week and was placed on antibiotic eye gtts, hycodan and prednisone. Her cough is productive with cream, to yellow to brown mucous. Has not had chill. Sweats or fatigue. Yesterday has a severe cough attack, she thought it would not stop.     Past Medical History:  Diagnosis Date   Acid reflux    Basal cell carcinoma (BCC) of face    Depression    Hypertension    Inflammatory polyps of colon [facility])    Sleep apnea     Patient Active Problem List   Diagnosis Date Noted   Acute bronchitis 06/23/2021   Post-menopause 03/23/2021   External hemorrhoids 03/23/2021   Hot flashes, menopausal 03/04/2021   Congestion of nasal sinus 03/04/2021   Elevated LFTs 02/25/2021   Constipation 01/01/2021   Left Achilles tendinitis 08/21/2020   Urinary frequency 08/21/2020   Increased thirst 08/21/2020   Hypercholesterolemia 04/26/2019   Situational mixed anxiety and depressive disorder 03/01/2019   Epistaxis 03/01/2019   History of basal cell cancer 01/26/2017   GAD (generalized anxiety disorder) 01/26/2017   Chronic insomnia 01/26/2017   Hypertension 01/26/2017   Chronic GERD 01/26/2017   Family history of colon cancer 01/26/2017   Prediabetes 01/26/2017   Perineal pain in female, chronic 01/26/2017   Moderate recurrent major depression ([place]) 11/03/2016   B12 deficiency 10/24/2016   Obstructive sleep apnea 11/02/2015   H/O adenomatous polyp of colon 11/02/2015   Basal cell carcinoma of skin of other parts of face 06/24/2014   Relaxation of external anal sphincter 06/24/2014    Past Surgical History:  Procedure Laterality  Date   APPENDECTOMY  1976   CHOLECYSTECTOMY  2005    OB History   No obstetric history on file.      Home Medications    Prior to Admission medications   Medication Sig Start Date End Date Taking? Authorizing Provider  albuterol (VENTOLIN HFA) 108 (90 Base) MCG/ACT inhaler Inhale 2 puffs into the lungs every 4 (four) hours as needed for wheezing or shortness of breath. And Cough attacks 07/01/21  Yes Rodriguez-Southworth, Sunday Spillers, PA-C  azithromycin (ZITHROMAX Z-PAK) 250 MG tablet 2 today, then one qd x 4 days 07/01/21  Yes Rodriguez-Southworth, Sunday Spillers, PA-C  amLODipine-valsartan (EXFORGE) 10-160 MG tablet TAKE 1 TABLET BY MOUTH EVERY DAY FOR BLOOD PRESSURE 02/08/21   Diona Browner, Amy E, MD  DENTA 5000 PLUS 1.1 % CREA dental cream  10/30/20   [provider]  estradiol (CLIMARA - DOSED IN MG/24 HR) 0.05 mg/24hr patch Place onto the skin. 06/30/21   [provider]  estradiol (VIVELLE-DOT) 0.05 MG/24HR patch 1 patch 2 (two) times a week. 02/10/21   [provider]  HYDROcodone bit-homatropine (HYCODAN) 5-1.5 MG/5ML syrup Take 5 mLs by mouth every 8 (eight) hours as needed for cough. 06/29/21   Bedsole, Amy E, MD  lamoTRIgine (LAMICTAL) 100 MG tablet Take 200 mg by mouth daily.    [provider]  levonorgestrel (MIRENA) 20 MCG/24HR IUD 1 each by Intrauterine route once.    [provider]  liothyronine (CYTOMEL) 25 MCG  tablet Take 25 mcg by mouth every morning. 02/19/20   [provider]  Magnesium Citrate 100 MG CAPS Take 300 mg by mouth daily.    [provider]  METAMUCIL FIBER PO Take 2 Scoops by mouth daily.    [provider]  metoprolol succinate (TOPROL-XL) 100 MG 24 hr tablet TAKE 1 TABLET BY MOUTH EVERY DAY WITH OR IMMEDIATELY FOLLOWING A MEAL 06/07/21   Bedsole, Amy E, MD  omeprazole (PRILOSEC) 20 MG capsule Take 20 mg by mouth daily.    [provider]  predniSONE (DELTASONE) 10 MG tablet Take 4 pills once  daily by mouth for 3 days, then 3 pills daily for 3 days, then 2 pills daily for 3 days then 1 pill daily for 3 days then stop 06/23/21   Tower, Wynelle Fanny, MD  promethazine-dextromethorphan (PROMETHAZINE-DM) 6.25-15 MG/5ML syrup Take 5 mLs by mouth 4 (four) times daily as needed for cough. 06/25/21   Scot Jun, FNP  trimethoprim-polymyxin b (POLYTRIM) ophthalmic solution Place 2 drops into both eyes in the morning, at noon, and at bedtime for 10 days. 06/25/21 07/05/21  Scot Jun, FNP  zolpidem (AMBIEN) 10 MG tablet Take 5 mg by mouth at bedtime as needed for sleep.    [provider]    Family History Family History  Problem Relation Age of Onset   Hypertension Mother    Depression Mother    Skin cancer Father    Hypertension Father    Depression Father    Cancer Father 55        colon   Stroke Maternal Grandmother    Stroke Paternal Grandmother    Heart disease Paternal Grandmother    Hypertension Paternal Grandmother    Cancer Maternal Grandfather        leukemia   Stroke Maternal Grandfather    Stroke Paternal Grandfather     Social History Social History   Tobacco Use   Smoking status: Never   Smokeless tobacco: Never  Vaping Use   Vaping Use: Never used  Substance Use Topics   Alcohol use: Yes    Comment: occ   Drug use: No     Allergies   Sulfa antibiotics, Levofloxacin, Tramadol, Mirabegron, and Cephalosporins   Review of Systems Review of Systems  Constitutional:  Negative for activity change, appetite change, chills, diaphoresis, fatigue and fever.  HENT:  Positive for congestion. Negative for ear discharge, ear pain, postnasal drip and sore throat.   Eyes:  Negative for discharge.  Respiratory:  Positive for cough and chest tightness. Negative for shortness of breath and wheezing.   Musculoskeletal:  Negative for myalgias.  Skin:  Negative for rash.  Neurological:  Negative for headaches.  Hematological:  Negative for adenopathy.     Physical Exam Triage Vital Signs ED Triage Vitals [07/01/21 0903]  Enc Vitals Group     BP 132/85     Pulse Rate 67     Resp 16     Temp 99 F (37.2 C)     Temp Source Oral     SpO2 98 %     Weight      Height      Head Circumference      Peak Flow      Pain Score 0     Pain Loc      Pain Edu?      Excl. in [place]?    No data found.  Updated Vital Signs BP 132/85 (BP  Location: Left Arm)    Pulse 67    Temp 99 F (37.2 C) (Oral)    Resp 16    LMP  (LMP Unknown)    SpO2 98%   Visual Acuity Right Eye Distance:   Left Eye Distance:   Bilateral Distance:    Right Eye Near:   Left Eye Near:    Bilateral Near:     Physical Exam Physical Exam Vitals signs and nursing note reviewed.  Constitutional:      General: She is not in acute distress.    Appearance: Normal appearance. She is not ill-appearing, toxic-appearing or diaphoretic.  HENT:     Head: Normocephalic.     Right Ear: Tympanic membrane, ear canal and external ear normal.     Left Ear: Tympanic membrane, ear canal and external ear normal.     Nose: Nose normal.     Mouth/Throat:     Mouth: Mucous membranes are moist.  Eyes:     General: No scleral icterus.       Right eye: No discharge.        Left eye: No discharge.     Conjunctiva/sclera: Conjunctivae normal.  Neck:     Musculoskeletal: Neck supple. No neck rigidity.  Cardiovascular:     Rate and Rhythm: Normal rate and regular rhythm.     Heart sounds: No murmur.  Pulmonary: Has a barkie sounding cough    Effort: Pulmonary effort is normal.     Breath sounds: Normal breath sounds.  Musculoskeletal: Normal range of motion.  Lymphadenopathy:     Cervical: No cervical adenopathy.  Skin:    General: Skin is warm and dry.     Coloration: Skin is not jaundiced.     Findings: No rash.  Neurological:     Mental Status: She is alert and oriented to person, place, and time.     Gait: Gait normal.  Psychiatric:        Mood and Affect: Mood normal.         Behavior: Behavior normal.        Thought Content: Thought content normal.        Judgment: Judgment normal.    UC Treatments / Results  Labs (all labs ordered are listed, but only abnormal results are displayed) Labs Reviewed - No data to display  EKG   Radiology DG Chest 2 View  Result Date: 07/01/2021 CLINICAL DATA:  Productive cough. EXAM: CHEST - 2 VIEW COMPARISON:  February 23, 2021. FINDINGS: The heart size and mediastinal contours are within normal limits. Both lungs are clear. The visualized skeletal structures are unremarkable. IMPRESSION: No active cardiopulmonary disease. Electronically Signed   By: Marijo Conception M.D.   On: 07/01/2021 09:46    Procedures Procedures (including critical care time)  Medications Ordered in UC Medications - No data to display  Initial Impression / Assessment and Plan / UC Course  I have reviewed the triage vital signs and the nursing notes.  Pertinent  imaging results that were available during my care of the patient were reviewed by me and considered in my medical decision making (see chart for details) Acute bronchitis I placed her on Proventil inhaler and Zpack as noted. See instructions.    Final Clinical Impressions(s) / UC Diagnoses   Final diagnoses:  Acute bronchitis, unspecified organism     Discharge Instructions      Your chest xray is normal Finish the prednisone and start the new medications today.  ED Prescriptions     Medication Sig Dispense Auth. Provider   azithromycin (ZITHROMAX Z-PAK) 250 MG tablet 2 today, then one qd x 4 days 6 tablet Rodriguez-Southworth, , PA-C   albuterol (VENTOLIN HFA) 108 (90 Base) MCG/ACT inhaler Inhale 2 puffs into the lungs every 4 (four) hours as needed for wheezing or shortness of breath. And Cough attacks 18 g Rodriguez-Southworth, Sunday Spillers, PA-C      PDMP not reviewed this encounter.   Shelby Mattocks, PA-C 07/01/21 1004

## 2021-07-02 ENCOUNTER — Ambulatory Visit: Payer: BC Managed Care – PPO | Admitting: Family Medicine

## 2021-07-06 DIAGNOSIS — I10 Essential (primary) hypertension: Secondary | ICD-10-CM | POA: Diagnosis not present

## 2021-07-06 DIAGNOSIS — E2839 Other primary ovarian failure: Secondary | ICD-10-CM | POA: Diagnosis not present

## 2021-07-06 DIAGNOSIS — G4733 Obstructive sleep apnea (adult) (pediatric): Secondary | ICD-10-CM | POA: Diagnosis not present

## 2021-07-06 DIAGNOSIS — R635 Abnormal weight gain: Secondary | ICD-10-CM | POA: Diagnosis not present

## 2021-07-07 DIAGNOSIS — Z713 Dietary counseling and surveillance: Secondary | ICD-10-CM | POA: Diagnosis not present

## 2021-07-07 DIAGNOSIS — I10 Essential (primary) hypertension: Secondary | ICD-10-CM | POA: Diagnosis not present

## 2021-07-07 DIAGNOSIS — G4733 Obstructive sleep apnea (adult) (pediatric): Secondary | ICD-10-CM | POA: Diagnosis not present

## 2021-07-07 DIAGNOSIS — E2839 Other primary ovarian failure: Secondary | ICD-10-CM | POA: Diagnosis not present

## 2021-07-14 NOTE — Telephone Encounter (Signed)
error 

## 2021-07-15 ENCOUNTER — Encounter: Payer: Self-pay | Admitting: Family Medicine

## 2021-07-16 MED ORDER — BENZONATATE 200 MG PO CAPS: 200.0000 mg | ORAL_CAPSULE | Freq: Two times a day (BID) | ORAL | 0 refills | Status: DC | PRN

## 2021-08-02 ENCOUNTER — Other Ambulatory Visit: Payer: Self-pay | Admitting: Family Medicine

## 2021-08-03 DIAGNOSIS — I10 Essential (primary) hypertension: Secondary | ICD-10-CM | POA: Diagnosis not present

## 2021-08-03 DIAGNOSIS — G4733 Obstructive sleep apnea (adult) (pediatric): Secondary | ICD-10-CM | POA: Diagnosis not present

## 2021-08-03 DIAGNOSIS — R635 Abnormal weight gain: Secondary | ICD-10-CM | POA: Diagnosis not present

## 2021-08-03 DIAGNOSIS — F32A Depression, unspecified: Secondary | ICD-10-CM | POA: Diagnosis not present

## 2021-08-04 DIAGNOSIS — I10 Essential (primary) hypertension: Secondary | ICD-10-CM | POA: Diagnosis not present

## 2021-08-04 DIAGNOSIS — G4733 Obstructive sleep apnea (adult) (pediatric): Secondary | ICD-10-CM | POA: Diagnosis not present

## 2021-08-04 DIAGNOSIS — Z713 Dietary counseling and surveillance: Secondary | ICD-10-CM | POA: Diagnosis not present

## 2021-08-04 DIAGNOSIS — R635 Abnormal weight gain: Secondary | ICD-10-CM | POA: Diagnosis not present

## 2021-08-09 DIAGNOSIS — R9389 Abnormal findings on diagnostic imaging of other specified body structures: Secondary | ICD-10-CM | POA: Diagnosis not present

## 2021-08-09 DIAGNOSIS — N840 Polyp of corpus uteri: Secondary | ICD-10-CM | POA: Diagnosis not present

## 2021-08-09 DIAGNOSIS — Z30432 Encounter for removal of intrauterine contraceptive device: Secondary | ICD-10-CM | POA: Diagnosis not present

## 2021-08-09 DIAGNOSIS — Z885 Allergy status to narcotic agent status: Secondary | ICD-10-CM | POA: Diagnosis not present

## 2021-08-09 DIAGNOSIS — Z881 Allergy status to other antibiotic agents status: Secondary | ICD-10-CM | POA: Diagnosis not present

## 2021-08-09 DIAGNOSIS — Z888 Allergy status to other drugs, medicaments and biological substances status: Secondary | ICD-10-CM | POA: Diagnosis not present

## 2021-08-09 DIAGNOSIS — Z79899 Other long term (current) drug therapy: Secondary | ICD-10-CM | POA: Diagnosis not present

## 2021-08-09 DIAGNOSIS — N859 Noninflammatory disorder of uterus, unspecified: Secondary | ICD-10-CM | POA: Diagnosis not present

## 2021-08-09 DIAGNOSIS — Z882 Allergy status to sulfonamides status: Secondary | ICD-10-CM | POA: Diagnosis not present

## 2021-08-09 DIAGNOSIS — I10 Essential (primary) hypertension: Secondary | ICD-10-CM | POA: Diagnosis not present

## 2021-08-11 DIAGNOSIS — I10 Essential (primary) hypertension: Secondary | ICD-10-CM | POA: Diagnosis not present

## 2021-08-11 DIAGNOSIS — Z713 Dietary counseling and surveillance: Secondary | ICD-10-CM | POA: Diagnosis not present

## 2021-08-11 DIAGNOSIS — R635 Abnormal weight gain: Secondary | ICD-10-CM | POA: Diagnosis not present

## 2021-08-11 DIAGNOSIS — E2839 Other primary ovarian failure: Secondary | ICD-10-CM | POA: Diagnosis not present

## 2021-08-24 ENCOUNTER — Encounter: Payer: Self-pay | Admitting: Family Medicine

## 2021-08-25 DIAGNOSIS — I10 Essential (primary) hypertension: Secondary | ICD-10-CM | POA: Diagnosis not present

## 2021-08-25 DIAGNOSIS — F32A Depression, unspecified: Secondary | ICD-10-CM | POA: Diagnosis not present

## 2021-08-25 DIAGNOSIS — R635 Abnormal weight gain: Secondary | ICD-10-CM | POA: Diagnosis not present

## 2021-08-25 DIAGNOSIS — G4733 Obstructive sleep apnea (adult) (pediatric): Secondary | ICD-10-CM | POA: Diagnosis not present

## 2021-08-26 DIAGNOSIS — R635 Abnormal weight gain: Secondary | ICD-10-CM | POA: Diagnosis not present

## 2021-08-26 DIAGNOSIS — I10 Essential (primary) hypertension: Secondary | ICD-10-CM | POA: Diagnosis not present

## 2021-08-26 DIAGNOSIS — Z713 Dietary counseling and surveillance: Secondary | ICD-10-CM | POA: Diagnosis not present

## 2021-09-02 ENCOUNTER — Ambulatory Visit (INDEPENDENT_AMBULATORY_CARE_PROVIDER_SITE_OTHER): Payer: BC Managed Care – PPO | Admitting: Family Medicine

## 2021-09-02 ENCOUNTER — Encounter: Payer: Self-pay | Admitting: Family Medicine

## 2021-09-02 ENCOUNTER — Other Ambulatory Visit: Payer: Self-pay

## 2021-09-02 VITALS — BP 128/80 | HR 77 | Ht 63.25 in | Wt 169.2 lb

## 2021-09-02 DIAGNOSIS — L0232 Furuncle of buttock: Secondary | ICD-10-CM | POA: Diagnosis not present

## 2021-09-02 DIAGNOSIS — L729 Follicular cyst of the skin and subcutaneous tissue, unspecified: Secondary | ICD-10-CM | POA: Insufficient documentation

## 2021-09-02 DIAGNOSIS — F331 Major depressive disorder, recurrent, moderate: Secondary | ICD-10-CM | POA: Diagnosis not present

## 2021-09-02 NOTE — Progress Notes (Signed)
? ? Patient ID: Brandi Terrell, female    DOB: [birth year], 56 y.o.   MRN: [id number] ? ?This visit was conducted in person. ? ?BP 128/80   Pulse 77   Ht 5' 3.25" (1.607 m)   Wt 169 lb 3.2 oz (76.7 kg)   LMP  (LMP Unknown)   SpO2 98%   BMI 29.74 kg/m?   ? ?CC:  ?Chief Complaint  ?Patient presents with  ? Cyst  ?  Noticed on  butt  on last week  , no drainage, soft, no pain with it now, she think it about heal , using antibiotic cream , heat on and has help   ? ? ?Subjective:  ? ?HPI: ?Brandi Terrell is a 56 y.o. female presenting on 09/02/2021 for Cyst (Noticed on  butt  on last week  , no drainage, soft, no pain with it now, she think it about heal , using antibiotic cream , heat on and has help ) ? ?1 week ago noted raised lesion on  right side lateral to anus. ? Started with dime size redness with pustule. Painful. ? No drainage. ? ? She has been doing warm compresses, antibiotics ointment. ? Now decreased in size and less redness, no longer painful ? ?  No flu like symptoms, fever, chills. ? ? ?Relevant past medical, surgical, family and social history reviewed and updated as indicated. Interim medical history since our last visit reviewed. ?Allergies and medications reviewed and updated. ?Outpatient Medications Prior to Visit  ?Medication Sig Dispense Refill  ? amLODipine-valsartan (EXFORGE) 10-160 MG tablet TAKE 1 TABLET BY MOUTH EVERY DAY FOR BLOOD PRESSURE 90 tablet 1  ? DENTA 5000 PLUS 1.1 % CREA dental cream     ? estradiol (CLIMARA - DOSED IN MG/24 HR) 0.05 mg/24hr patch Place onto the skin.    ? lamoTRIgine (LAMICTAL) 100 MG tablet Take 200 mg by mouth daily.    ? liothyronine (CYTOMEL) 25 MCG tablet Take 25 mcg by mouth every morning.    ? Magnesium Citrate 100 MG CAPS Take 300 mg by mouth daily.    ? METAMUCIL FIBER PO Take 2 Scoops by mouth daily.    ? metoprolol succinate (TOPROL-XL) 100 MG 24 hr tablet TAKE 1 TABLET BY MOUTH EVERY DAY WITH OR IMMEDIATELY FOLLOWING A MEAL 90 tablet 3  ?  omeprazole (PRILOSEC) 20 MG capsule Take 20 mg by mouth daily.    ? promethazine-dextromethorphan (PROMETHAZINE-DM) 6.25-15 MG/5ML syrup Take 5 mLs by mouth 4 (four) times daily as needed for cough. 180 mL 0  ? albuterol (VENTOLIN HFA) 108 (90 Base) MCG/ACT inhaler Inhale 2 puffs into the lungs every 4 (four) hours as needed for wheezing or shortness of breath. And Cough attacks (Patient not taking: Reported on 09/02/2021) 18 g 0  ? azithromycin (ZITHROMAX Z-PAK) 250 MG tablet 2 today, then one qd x 4 days 6 tablet 0  ? benzonatate (TESSALON) 200 MG capsule Take 1 capsule (200 mg total) by mouth 2 (two) times daily as needed for cough. 20 capsule 0  ? estradiol (VIVELLE-DOT) 0.05 MG/24HR patch 1 patch 2 (two) times a week.    ? HYDROcodone bit-homatropine (HYCODAN) 5-1.5 MG/5ML syrup Take 5 mLs by mouth every 8 (eight) hours as needed for cough. 120 mL 0  ? levonorgestrel (MIRENA) 20 MCG/24HR IUD 1 each by Intrauterine route once.    ? predniSONE (DELTASONE) 10 MG tablet Take 4 pills once daily by mouth for 3 days, then 3 pills daily  for 3 days, then 2 pills daily for 3 days then 1 pill daily for 3 days then stop 30 tablet 0  ? zolpidem (AMBIEN) 10 MG tablet Take 5 mg by mouth at bedtime as needed for sleep.    ? ?No facility-administered medications prior to visit.  ?  ? ?Per HPI unless specifically indicated in ROS section below ?Review of Systems  ?Constitutional:  Negative for fatigue and fever.  ?HENT:  Negative for ear pain.   ?Eyes:  Negative for pain.  ?Respiratory:  Negative for chest tightness and shortness of breath.   ?Cardiovascular:  Negative for chest pain, palpitations and leg swelling.  ?Gastrointestinal:  Negative for abdominal pain.  ?Genitourinary:  Negative for dysuria.  ?Objective:  ?BP 128/80   Pulse 77   Ht 5' 3.25" (1.607 m)   Wt 169 lb 3.2 oz (76.7 kg)   LMP  (LMP Unknown)   SpO2 98%   BMI 29.74 kg/m?   ?Wt Readings from Last 3 Encounters:  ?09/02/21 169 lb 3.2 oz (76.7 kg)  ?05/07/21  158 lb 4 oz (71.8 kg)  ?03/04/21 159 lb 8 oz (72.3 kg)  ?  ?  ?Physical Exam ?Constitutional:   ?   General: She is not in acute distress. ?   Appearance: Normal appearance. She is well-developed. She is not ill-appearing or toxic-appearing.  ?HENT:  ?   Head: Normocephalic.  ?   Right Ear: Hearing, tympanic membrane, ear canal and external ear normal. Tympanic membrane is not erythematous, retracted or bulging.  ?   Left Ear: Hearing, tympanic membrane, ear canal and external ear normal. Tympanic membrane is not erythematous, retracted or bulging.  ?   Nose: No mucosal edema or rhinorrhea.  ?   Right Sinus: No maxillary sinus tenderness or frontal sinus tenderness.  ?   Left Sinus: No maxillary sinus tenderness or frontal sinus tenderness.  ?   Mouth/Throat:  ?   Pharynx: Uvula midline.  ?Eyes:  ?   General: Lids are normal. Lids are everted, no foreign bodies appreciated.  ?   Conjunctiva/sclera: Conjunctivae normal.  ?   Pupils: Pupils are equal, round, and reactive to light.  ?Neck:  ?   Thyroid: No thyroid mass or thyromegaly.  ?   Vascular: No carotid bruit.  ?   Trachea: Trachea normal.  ?Cardiovascular:  ?   Rate and Rhythm: Normal rate and regular rhythm.  ?   Pulses: Normal pulses.  ?   Heart sounds: Normal heart sounds, S1 normal and S2 normal. No murmur heard. ?  No friction rub. No gallop.  ?Pulmonary:  ?   Effort: Pulmonary effort is normal. No tachypnea or respiratory distress.  ?   Breath sounds: Normal breath sounds. No decreased breath sounds, wheezing, rhonchi or rales.  ?Abdominal:  ?   General: Bowel sounds are normal.  ?   Palpations: Abdomen is soft.  ?   Tenderness: There is no abdominal tenderness.  ?Musculoskeletal:  ?   Cervical back: Normal range of motion and neck supple.  ?Skin: ?   General: Skin is warm and dry.  ?   Findings: No rash.  ?Neurological:  ?   Mental Status: She is alert.  ?Psychiatric:     ?   Mood and Affect: Mood is not anxious or depressed.     ?   Speech: Speech  normal.     ?   Behavior: Behavior normal. Behavior is cooperative.     ?  Thought Content: Thought content normal.     ?   Judgment: Judgment normal.  ? ?   ?Results for orders placed or performed during the hospital encounter of 06/25/21  ?POCT Influenza A/B  ?Result Value Ref Range  ? Influenza A, POC Negative Negative  ? Influenza B, POC Negative Negative  ? ? ?This visit occurred during the SARS-CoV-2 public health emergency.  Safety protocols were in place, including screening questions prior to the visit, additional usage of staff PPE, and extensive cleaning of exam room while observing appropriate contact time as indicated for disinfecting solutions.  ? ?COVID 19 screen:  No recent travel or known exposure to [place] ?The patient denies respiratory symptoms of COVID 19 at this time. ?The importance of social distancing was discussed today.  ? ?Assessment and Plan ? ?  ?Problem List Items Addressed This Visit   ? ? Moderate recurrent major depression (HCC) (Chronic)  ?  Chronic, well controlled ? ?She is in the process of changing her psychiatrist.  Her new patient appointment is in November with Dr. Toy Care. ?She requests that if she needs refills in the interim that I prescribe Cytomel, Lamictal and Ambien.  She will first try to contact her current psychiatrist to see if he will continue the prescriptions. ? ?I have agreed to continue these in the interim if necessary. ?  ?  ? Cyst of buttocks - Primary  ?  Acute, now resolving ? ?Continue warm compresses as well as topical antibiotic ointment.  Start antibacterial soap. ?  ?  ? ? ? ?Eliezer Lofts, MD  ? ?

## 2021-09-02 NOTE — Assessment & Plan Note (Signed)
Acute, now resolving ? ?Continue warm compresses as well as topical antibiotic ointment.  Start antibacterial soap. ?

## 2021-09-02 NOTE — Patient Instructions (Signed)
Continue as discussed. ? ?

## 2021-09-02 NOTE — Assessment & Plan Note (Signed)
Chronic, well controlled ? ?She is in the process of changing her psychiatrist.  Her new patient appointment is in November with Dr. Toy Care. ?She requests that if she needs refills in the interim that I prescribe Cytomel, Lamictal and Ambien.  She will first try to contact her current psychiatrist to see if he will continue the prescriptions. ? ?I have agreed to continue these in the interim if necessary. ?

## 2021-09-09 DIAGNOSIS — R635 Abnormal weight gain: Secondary | ICD-10-CM | POA: Diagnosis not present

## 2021-09-09 DIAGNOSIS — Z713 Dietary counseling and surveillance: Secondary | ICD-10-CM | POA: Diagnosis not present

## 2021-09-09 DIAGNOSIS — G4733 Obstructive sleep apnea (adult) (pediatric): Secondary | ICD-10-CM | POA: Diagnosis not present

## 2021-09-15 DIAGNOSIS — I10 Essential (primary) hypertension: Secondary | ICD-10-CM | POA: Diagnosis not present

## 2021-09-15 DIAGNOSIS — R635 Abnormal weight gain: Secondary | ICD-10-CM | POA: Diagnosis not present

## 2021-09-15 DIAGNOSIS — G4733 Obstructive sleep apnea (adult) (pediatric): Secondary | ICD-10-CM | POA: Diagnosis not present

## 2021-09-15 DIAGNOSIS — F32A Depression, unspecified: Secondary | ICD-10-CM | POA: Diagnosis not present

## 2021-10-07 DIAGNOSIS — G4733 Obstructive sleep apnea (adult) (pediatric): Secondary | ICD-10-CM | POA: Diagnosis not present

## 2021-10-07 DIAGNOSIS — Z713 Dietary counseling and surveillance: Secondary | ICD-10-CM | POA: Diagnosis not present

## 2021-10-07 DIAGNOSIS — R635 Abnormal weight gain: Secondary | ICD-10-CM | POA: Diagnosis not present

## 2021-10-20 DIAGNOSIS — Z713 Dietary counseling and surveillance: Secondary | ICD-10-CM | POA: Diagnosis not present

## 2021-10-20 DIAGNOSIS — R635 Abnormal weight gain: Secondary | ICD-10-CM | POA: Diagnosis not present

## 2021-11-02 DIAGNOSIS — Z713 Dietary counseling and surveillance: Secondary | ICD-10-CM | POA: Diagnosis not present

## 2021-11-02 DIAGNOSIS — R635 Abnormal weight gain: Secondary | ICD-10-CM | POA: Diagnosis not present

## 2021-11-05 ENCOUNTER — Ambulatory Visit (INDEPENDENT_AMBULATORY_CARE_PROVIDER_SITE_OTHER): Payer: BC Managed Care – PPO | Admitting: Family Medicine

## 2021-11-05 VITALS — BP 110/74 | HR 68 | Temp 98.1°F | Ht 63.25 in | Wt 166.2 lb

## 2021-11-05 DIAGNOSIS — E538 Deficiency of other specified B group vitamins: Secondary | ICD-10-CM

## 2021-11-05 DIAGNOSIS — I10 Essential (primary) hypertension: Secondary | ICD-10-CM | POA: Diagnosis not present

## 2021-11-05 DIAGNOSIS — E7800 Pure hypercholesterolemia, unspecified: Secondary | ICD-10-CM

## 2021-11-05 DIAGNOSIS — F331 Major depressive disorder, recurrent, moderate: Secondary | ICD-10-CM

## 2021-11-05 DIAGNOSIS — R7303 Prediabetes: Secondary | ICD-10-CM

## 2021-11-05 DIAGNOSIS — R0981 Nasal congestion: Secondary | ICD-10-CM | POA: Diagnosis not present

## 2021-11-05 NOTE — Assessment & Plan Note (Signed)
Chronic, moderate control  She is awaiting her new patient appointment for a psychiatrist change.  She and has enough medication to continue at this point I have encouraged her to continue working on healthy lifestyle stress reduction, relaxation as well as to contact her grief therapist regarding her difficulties with the anniversary of the loss of her mother and Mother's Day.

## 2021-11-05 NOTE — Assessment & Plan Note (Signed)
Stable, chronic.  Continue current medication.   exforge 10/160 mg daily, metoprolol 100 mg Xl daily

## 2021-11-05 NOTE — Patient Instructions (Signed)
Start Flonase 2 sprays per nostril daily for 2 weeks.  Call if ear pressure not improving.  Let me know if one sided face pain, fever or if continuing improve over the week.

## 2021-11-05 NOTE — Assessment & Plan Note (Signed)
Acute, likely viral URI.  No sign of bacterial infection.  Ear pressure symptoms likely due to eustachian tube dysfunction.  Reports she is starting to improve except for her ear pressure.  She will start Flonase 2 sprays per nostril daily x2 weeks.  She will contact me for possible prednisone course if it does not improve as expected.

## 2021-11-05 NOTE — Progress Notes (Signed)
Patient ID: Brandi Terrell, female    DOB: 06-23-1965, 56 y.o.   MRN: [id number]  This visit was conducted in person.  LMP  (LMP Unknown)    CC:  Chief Complaint  Patient presents with   Follow-up    6 month  follow up  Had cold for 7 days  taking otc medication for cold ,  ears feel full, coughing, sore throat, covid test was neg,     Subjective:   HPI: Brandi Terrell is a 56 y.o. female presenting on 11/05/2021 for Follow-up (6 month  follow up/ Had cold for 7 days  taking otc medication for cold ,  ears feel full, coughing, sore throat, covid test was neg, )  She reports new onset  ST, congestion x 7 days.. progressed to  sinus pressure, pressure in ears,  productive  cough. No fever.  No SOB, no wheeze.  COVID home test negative on  day 3 of illness.  In last 24 hours she has started improving.Marland Kitchen less pressure.  Sh is using Mucinex decongestant  Hypertension:    Great control on exforge 10/160 mg daily, metoprolol 100 mg Xl daily BP Readings from Last 3 Encounters:  11/05/21 110/74  09/02/21 128/80  07/01/21 132/85  Using medication without problems or lightheadedness:  none Chest pain with exertion: Edema: none Short of breath: none Average home BPs:  120/80s Other issues:  MDD.Marland Kitchen  she reports recent worsening of motivation, feeling somewhat lower. Upcoming OV in NOV with Dr. Toy Care.  She has prescriptions for 3-12 month for these meds.. Ambien, cytomel and Lamictal.  She is working on behavioral changes and healthy lifetstyle to help with mood.   Has counselor she can get set up with, telehealth.      Had IUD removed in 07/2021, stopped estradiol patches... has apt next week with menopausal specialist. Minimal hot flashes.  Relevant past medical, surgical, family and social history reviewed and updated as indicated. Interim medical history since our last visit reviewed. Allergies and medications reviewed and updated. Outpatient Medications Prior to Visit   Medication Sig Dispense Refill   amLODipine-valsartan (EXFORGE) 10-160 MG tablet TAKE 1 TABLET BY MOUTH EVERY DAY FOR BLOOD PRESSURE 90 tablet 1   DENTA 5000 PLUS 1.1 % CREA dental cream      lamoTRIgine (LAMICTAL) 100 MG tablet Take 200 mg by mouth daily.     liothyronine (CYTOMEL) 25 MCG tablet Take 25 mcg by mouth every morning.     Magnesium Citrate 100 MG CAPS Take 300 mg by mouth daily.     METAMUCIL FIBER PO Take 2 Scoops by mouth daily.     metoprolol succinate (TOPROL-XL) 100 MG 24 hr tablet TAKE 1 TABLET BY MOUTH EVERY DAY WITH OR IMMEDIATELY FOLLOWING A MEAL 90 tablet 3   omeprazole (PRILOSEC) 20 MG capsule Take 20 mg by mouth daily.     zolpidem (AMBIEN) 10 MG tablet Take by mouth.     albuterol (VENTOLIN HFA) 108 (90 Base) MCG/ACT inhaler Inhale 2 puffs into the lungs every 4 (four) hours as needed for wheezing or shortness of breath. And Cough attacks (Patient not taking: Reported on 11/05/2021) 18 g 0   estradiol (CLIMARA - DOSED IN MG/24 HR) 0.05 mg/24hr patch Place onto the skin. (Patient not taking: Reported on 11/05/2021)     No facility-administered medications prior to visit.     Per HPI unless specifically indicated in ROS section below Review of Systems  Constitutional:  Negative  for fatigue and fever.  HENT:  Negative for congestion.   Eyes:  Negative for pain.  Respiratory:  Negative for cough and shortness of breath.   Cardiovascular:  Negative for chest pain, palpitations and leg swelling.  Gastrointestinal:  Negative for abdominal pain.  Genitourinary:  Negative for dysuria and vaginal bleeding.  Musculoskeletal:  Negative for back pain.  Neurological:  Negative for syncope, light-headedness and headaches.  Psychiatric/Behavioral:  Negative for dysphoric mood.   Objective:  LMP  (LMP Unknown)   Wt Readings from Last 3 Encounters:  09/02/21 169 lb 3.2 oz (76.7 kg)  05/07/21 158 lb 4 oz (71.8 kg)  03/04/21 159 lb 8 oz (72.3 kg)      Physical  Exam Constitutional:      General: She is not in acute distress.    Appearance: Normal appearance. She is well-developed. She is not ill-appearing or toxic-appearing.  HENT:     Head: Normocephalic.     Right Ear: Hearing, ear canal and external ear normal. A middle ear effusion is present. Tympanic membrane is not erythematous, retracted or bulging.     Left Ear: Hearing, ear canal and external ear normal. A middle ear effusion is present. Tympanic membrane is not erythematous, retracted or bulging.     Nose: No mucosal edema or rhinorrhea.     Right Sinus: No maxillary sinus tenderness or frontal sinus tenderness.     Left Sinus: No maxillary sinus tenderness or frontal sinus tenderness.     Mouth/Throat:     Pharynx: Uvula midline.  Eyes:     General: Lids are normal. Lids are everted, no foreign bodies appreciated.     Conjunctiva/sclera: Conjunctivae normal.     Pupils: Pupils are equal, round, and reactive to light.  Neck:     Thyroid: No thyroid mass or thyromegaly.     Vascular: No carotid bruit.     Trachea: Trachea normal.  Cardiovascular:     Rate and Rhythm: Normal rate and regular rhythm.     Pulses: Normal pulses.     Heart sounds: Normal heart sounds, S1 normal and S2 normal. No murmur heard.   No friction rub. No gallop.  Pulmonary:     Effort: Pulmonary effort is normal. No tachypnea or respiratory distress.     Breath sounds: Normal breath sounds. No decreased breath sounds, wheezing, rhonchi or rales.  Abdominal:     General: Bowel sounds are normal.     Palpations: Abdomen is soft.     Tenderness: There is no abdominal tenderness.  Musculoskeletal:     Cervical back: Normal range of motion and neck supple.  Skin:    General: Skin is warm and dry.     Findings: No rash.  Neurological:     Mental Status: She is alert.  Psychiatric:        Mood and Affect: Mood is not anxious or depressed.        Speech: Speech normal.        Behavior: Behavior normal.  Behavior is cooperative.        Thought Content: Thought content normal.        Judgment: Judgment normal.      Results for orders placed or performed during the hospital encounter of 06/25/21  POCT Influenza A/B  Result Value Ref Range   Influenza A, POC Negative Negative   Influenza B, POC Negative Negative     COVID 19 screen:  No recent travel or known exposure to  COVID19 The patient denies respiratory symptoms of COVID 19 at this time. The importance of social distancing was discussed today.   Assessment and Plan Problem List Items Addressed This Visit     Hypercholesterolemia (Chronic)   Relevant Orders   Lipid panel   Comprehensive metabolic panel   Hypertension - Primary (Chronic)    Stable, chronic.  Continue current medication.   exforge 10/160 mg daily, metoprolol 100 mg Xl daily      Relevant Orders   TSH   T4, free   T3, free   Moderate recurrent major depression (HCC) (Chronic)    Chronic, moderate control  She is awaiting her new patient appointment for a psychiatrist change.  She and has enough medication to continue at this point I have encouraged her to continue working on healthy lifestyle stress reduction, relaxation as well as to contact her grief therapist regarding her difficulties with the anniversary of the loss of her mother and Mother's Day.        Prediabetes (Chronic)   Relevant Orders   Hemoglobin A1c   B12 deficiency   Relevant Orders   Vitamin B12   Congestion of nasal sinus    Acute, likely viral URI.  No sign of bacterial infection.  Ear pressure symptoms likely due to eustachian tube dysfunction.  Reports she is starting to improve except for her ear pressure.  She will start Flonase 2 sprays per nostril daily x2 weeks.  She will contact me for possible prednisone course if it does not improve as expected.       Orders Placed This Encounter  Procedures   Hemoglobin A1c    Standing Status:   Future    Standing Expiration  Date:   10/19/2022   Lipid panel    Standing Status:   Future    Standing Expiration Date:   10/19/2022   Comprehensive metabolic panel    Standing Status:   Future    Standing Expiration Date:   10/19/2022   Vitamin B12    Standing Status:   Future    Standing Expiration Date:   10/19/2022   TSH    Standing Status:   Future    Standing Expiration Date:   10/19/2022   T4, free    Standing Status:   Future    Standing Expiration Date:   10/19/2022   T3, free    Standing Status:   Future    Standing Expiration Date:   10/19/2022       Eliezer Lofts, MD

## 2021-11-10 DIAGNOSIS — R635 Abnormal weight gain: Secondary | ICD-10-CM | POA: Diagnosis not present

## 2021-11-10 DIAGNOSIS — Z713 Dietary counseling and surveillance: Secondary | ICD-10-CM | POA: Diagnosis not present

## 2021-11-12 DIAGNOSIS — N951 Menopausal and female climacteric states: Secondary | ICD-10-CM | POA: Diagnosis not present

## 2021-11-17 ENCOUNTER — Encounter: Payer: Self-pay | Admitting: Family Medicine

## 2021-11-18 DIAGNOSIS — R635 Abnormal weight gain: Secondary | ICD-10-CM | POA: Diagnosis not present

## 2021-11-18 DIAGNOSIS — Z713 Dietary counseling and surveillance: Secondary | ICD-10-CM | POA: Diagnosis not present

## 2021-11-30 DIAGNOSIS — G4733 Obstructive sleep apnea (adult) (pediatric): Secondary | ICD-10-CM | POA: Diagnosis not present

## 2021-11-30 DIAGNOSIS — F32A Depression, unspecified: Secondary | ICD-10-CM | POA: Diagnosis not present

## 2021-11-30 DIAGNOSIS — R635 Abnormal weight gain: Secondary | ICD-10-CM | POA: Diagnosis not present

## 2021-11-30 DIAGNOSIS — I10 Essential (primary) hypertension: Secondary | ICD-10-CM | POA: Diagnosis not present

## 2021-12-07 DIAGNOSIS — G4733 Obstructive sleep apnea (adult) (pediatric): Secondary | ICD-10-CM | POA: Diagnosis not present

## 2021-12-07 DIAGNOSIS — F32A Depression, unspecified: Secondary | ICD-10-CM | POA: Diagnosis not present

## 2021-12-07 DIAGNOSIS — I10 Essential (primary) hypertension: Secondary | ICD-10-CM | POA: Diagnosis not present

## 2021-12-07 DIAGNOSIS — R635 Abnormal weight gain: Secondary | ICD-10-CM | POA: Diagnosis not present

## 2021-12-15 DIAGNOSIS — R635 Abnormal weight gain: Secondary | ICD-10-CM | POA: Diagnosis not present

## 2021-12-15 DIAGNOSIS — F32A Depression, unspecified: Secondary | ICD-10-CM | POA: Diagnosis not present

## 2021-12-15 DIAGNOSIS — I10 Essential (primary) hypertension: Secondary | ICD-10-CM | POA: Diagnosis not present

## 2021-12-15 DIAGNOSIS — G4733 Obstructive sleep apnea (adult) (pediatric): Secondary | ICD-10-CM | POA: Diagnosis not present

## 2021-12-16 ENCOUNTER — Encounter: Payer: Self-pay | Admitting: Family Medicine

## 2021-12-16 DIAGNOSIS — Z1231 Encounter for screening mammogram for malignant neoplasm of breast: Secondary | ICD-10-CM

## 2021-12-16 DIAGNOSIS — R928 Other abnormal and inconclusive findings on diagnostic imaging of breast: Secondary | ICD-10-CM

## 2021-12-24 DIAGNOSIS — F331 Major depressive disorder, recurrent, moderate: Secondary | ICD-10-CM | POA: Diagnosis not present

## 2021-12-30 DIAGNOSIS — F331 Major depressive disorder, recurrent, moderate: Secondary | ICD-10-CM | POA: Diagnosis not present

## 2021-12-30 DIAGNOSIS — F5081 Binge eating disorder: Secondary | ICD-10-CM | POA: Diagnosis not present

## 2021-12-30 NOTE — Addendum Note (Signed)
Addended by: Eliezer Lofts E on: 12/30/2021 06:45 PM   Modules accepted: Orders

## 2021-12-30 NOTE — Telephone Encounter (Signed)
Please fax new order if needed.

## 2021-12-31 ENCOUNTER — Ambulatory Visit (INDEPENDENT_AMBULATORY_CARE_PROVIDER_SITE_OTHER): Payer: BC Managed Care – PPO | Admitting: Family Medicine

## 2021-12-31 VITALS — BP 118/72 | HR 66 | Temp 97.4°F | Ht 63.25 in | Wt 170.5 lb

## 2021-12-31 DIAGNOSIS — Z79899 Other long term (current) drug therapy: Secondary | ICD-10-CM

## 2021-12-31 DIAGNOSIS — R2689 Other abnormalities of gait and mobility: Secondary | ICD-10-CM | POA: Insufficient documentation

## 2021-12-31 DIAGNOSIS — R0789 Other chest pain: Secondary | ICD-10-CM

## 2021-12-31 DIAGNOSIS — R4701 Aphasia: Secondary | ICD-10-CM

## 2021-12-31 DIAGNOSIS — R928 Other abnormal and inconclusive findings on diagnostic imaging of breast: Secondary | ICD-10-CM | POA: Diagnosis not present

## 2021-12-31 DIAGNOSIS — F331 Major depressive disorder, recurrent, moderate: Secondary | ICD-10-CM

## 2021-12-31 NOTE — Assessment & Plan Note (Addendum)
Possibly due to medication side effect.  Evaluate with labs.  May need to evaluate with imaging of brain.  No red flags on exam today.  Normal neurologic exam.  No known head injury.

## 2021-12-31 NOTE — Assessment & Plan Note (Signed)
Orders placed for bilateral ultrasound and bilateral diagnostic mammogram.

## 2021-12-31 NOTE — Assessment & Plan Note (Signed)
Chronic, inadequate control potentially contributing to cognitive symptoms.

## 2021-12-31 NOTE — Assessment & Plan Note (Signed)
EKG showed normal sinus rhythm with no change from previous in 2022.  Her symptoms could potentially be due to a chest wall soreness following using clippers above her head leaning forward versus reflux symptoms versus anxiety versus GI source.  Given radiation of pain through the back I will evaluate with a complete metabolic panel and lipase.  She is status postcholecystectomy. She will continue PPI but we may need to increase this back to a higher dose.

## 2021-12-31 NOTE — Patient Instructions (Addendum)
Please stop at the lab to have labs drawn. Keep follow up with psychiatry as planned to  stop Ambien given increased risk for side effects.  Can start a baby aspirin 81 mg daily enteric-coated for stroke and heart disease prevention as long as no associated stomach irritation.

## 2021-12-31 NOTE — Assessment & Plan Note (Signed)
Unclear if secondary to medication side effect from the medicine such as Ambien or new issue.  Will evaluate with labs.   If continued will consider doing MRI of the brain to evaluate further.  Encouraged her to speak with psychiatry about coming off Ambien.

## 2021-12-31 NOTE — Progress Notes (Signed)
Patient ID: Brandi Terrell, female    DOB: [birth year], 56 y.o.   MRN: [id number]  This visit was conducted in person.  BP 118/72   Pulse 66   Temp (!) 97.4 F (36.3 C) (Temporal)   Ht 5' 3.25" (1.607 m)   Wt 170 lb 8 oz (77.3 kg)   LMP  (LMP Unknown)   SpO2 97%   BMI 29.96 kg/m    CC:  Chief Complaint  Patient presents with   Chest Pain    In the sternum, 6 days ago, while outside doing yard work.    off balance    Pt says she is running into walls more often.    Aphasia    Words don't come out right    Subjective:   HPI: Brandi Terrell is a 56 y.o. female with history of B12 deficiency, major depressive disorder, chronic insomnia, chronic reflux, generalized anxiety disorder, high cholesterol and hypertension presenting on 12/31/2021 for Chest Pain (In the sternum, 6 days ago, while outside doing yard work. ), off balance (Pt says she is running into walls more often. ), and Aphasia (Words don't come out right)   Of note she had an abnormal mammogram and is being called back for diagnostic mammogram along with bilateral ultrasound.  Orders faxed to [facility] radiology.  Blood pressure at goal on exforge 10/160 mg daily, metoprolol 100 mg Xl daily    Today she reports new onset chest pain 6 days ago. While working in the yard, trimming bushes using hedge clippers above head.  Felt epigastric abd/lower chest pain... started during activity... went to lie down. Went away after 20 minutes. Describes as burning.  No associated, SOB, nausea, no neck pain, arm pain.  Did have some radiation of pain ito back.  No furtehr SOB.   She is on omeprazole  40 mg  ( months ago for tooth erosion) for GERD.Marland Kitchen well controlled most of the time.  1 week ago when she started feeling " weird"   She feels in last  several weeks she feels " weird'. Seems to walk into walls, feels like she has been unable to word find. No dizziness, mild fatigue.  Occ AM headache  No dose change on  Ambien and Lamictal.  Has appt next week with psychiatry to come off Ambien.   Her depression has been slightly worse lately.. but has started seeing  anew therapist.  No new numbness, no new weakness... did have some decreased tingling  She has dementia that runs in her family.       Relevant past medical, surgical, family and social history reviewed and updated as indicated. Interim medical history since our last visit reviewed. Allergies and medications reviewed and updated. Outpatient Medications Prior to Visit  Medication Sig Dispense Refill   amLODipine-valsartan (EXFORGE) 10-160 MG tablet TAKE 1 TABLET BY MOUTH EVERY DAY FOR BLOOD PRESSURE 90 tablet 1   lamoTRIgine (LAMICTAL) 100 MG tablet Take 200 mg by mouth daily.     liothyronine (CYTOMEL) 25 MCG tablet Take 25 mcg by mouth every morning.     Magnesium Citrate 100 MG CAPS Take 300 mg by mouth daily.     METAMUCIL FIBER PO Take 2 Scoops by mouth daily.     metoprolol succinate (TOPROL-XL) 100 MG 24 hr tablet TAKE 1 TABLET BY MOUTH EVERY DAY WITH OR IMMEDIATELY FOLLOWING A MEAL 90 tablet 3   omeprazole (PRILOSEC) 20 MG capsule Take 20 mg by mouth  daily.     zolpidem (AMBIEN) 10 MG tablet Take by mouth.     albuterol (VENTOLIN HFA) 108 (90 Base) MCG/ACT inhaler Inhale 2 puffs into the lungs every 4 (four) hours as needed for wheezing or shortness of breath. And Cough attacks (Patient not taking: Reported on 11/05/2021) 18 g 0   DENTA 5000 PLUS 1.1 % CREA dental cream      estradiol (CLIMARA - DOSED IN MG/24 HR) 0.05 mg/24hr patch Place onto the skin. (Patient not taking: Reported on 11/05/2021)     No facility-administered medications prior to visit.     Per HPI unless specifically indicated in ROS section below Review of Systems  Constitutional:  Negative for fatigue and fever.  HENT:  Negative for congestion.   Eyes:  Negative for pain.  Respiratory:  Negative for cough, chest tightness and shortness of breath.    Cardiovascular:  Positive for chest pain. Negative for palpitations and leg swelling.  Gastrointestinal:  Positive for abdominal pain.  Genitourinary:  Negative for dysuria and vaginal bleeding.  Musculoskeletal:  Negative for back pain.  Neurological:  Positive for weakness. Negative for syncope, light-headedness and headaches.  Psychiatric/Behavioral:  Negative for dysphoric mood.    Objective:  BP 118/72   Pulse 66   Temp (!) 97.4 F (36.3 C) (Temporal)   Ht 5' 3.25" (1.607 m)   Wt 170 lb 8 oz (77.3 kg)   LMP  (LMP Unknown)   SpO2 97%   BMI 29.96 kg/m   Wt Readings from Last 3 Encounters:  12/31/21 170 lb 8 oz (77.3 kg)  11/05/21 166 lb 3.2 oz (75.4 kg)  09/02/21 169 lb 3.2 oz (76.7 kg)      Physical Exam Constitutional:      General: She is not in acute distress.    Appearance: Normal appearance. She is well-developed. She is not ill-appearing or toxic-appearing.  HENT:     Head: Normocephalic.     Right Ear: Hearing, tympanic membrane, ear canal and external ear normal. Tympanic membrane is not erythematous, retracted or bulging.     Left Ear: Hearing, tympanic membrane, ear canal and external ear normal. Tympanic membrane is not erythematous, retracted or bulging.     Nose: No mucosal edema or rhinorrhea.     Right Sinus: No maxillary sinus tenderness or frontal sinus tenderness.     Left Sinus: No maxillary sinus tenderness or frontal sinus tenderness.     Mouth/Throat:     Pharynx: Uvula midline.  Eyes:     General: Lids are normal. Lids are everted, no foreign bodies appreciated.     Conjunctiva/sclera: Conjunctivae normal.     Pupils: Pupils are equal, round, and reactive to light.  Neck:     Thyroid: No thyroid mass or thyromegaly.     Vascular: No carotid bruit.     Trachea: Trachea normal.  Cardiovascular:     Rate and Rhythm: Normal rate and regular rhythm.     Pulses: Normal pulses.     Heart sounds: Normal heart sounds, S1 normal and S2 normal. No  murmur heard.    No friction rub. No gallop.  Pulmonary:     Effort: Pulmonary effort is normal. No tachypnea or respiratory distress.     Breath sounds: Normal breath sounds. No decreased breath sounds, wheezing, rhonchi or rales.  Chest:     Chest wall: No mass or tenderness.  Abdominal:     General: Bowel sounds are normal.     Palpations:  Abdomen is soft.     Tenderness: There is abdominal tenderness in the epigastric area. There is no right CVA tenderness or left CVA tenderness.  Musculoskeletal:     Cervical back: Normal range of motion and neck supple.  Skin:    General: Skin is warm and dry.     Findings: No rash.  Neurological:     Mental Status: She is alert and oriented to person, place, and time.     GCS: GCS eye subscore is 4. GCS verbal subscore is 5. GCS motor subscore is 6.     Cranial Nerves: No cranial nerve deficit.     Sensory: No sensory deficit.     Motor: No abnormal muscle tone.     Coordination: Coordination normal.     Gait: Gait normal.     Deep Tendon Reflexes: Reflexes are normal and symmetric.     Comments: Nml cerebellar exam   No papilledema  Psychiatric:        Mood and Affect: Mood is not anxious or depressed.        Speech: Speech normal.        Behavior: Behavior normal. Behavior is cooperative.        Thought Content: Thought content normal.        Cognition and Memory: Memory is not impaired. She does not exhibit impaired recent memory or impaired remote memory.        Judgment: Judgment normal.       Results for orders placed or performed during the hospital encounter of 06/25/21  POCT Influenza A/B  Result Value Ref Range   Influenza A, POC Negative Negative   Influenza B, POC Negative Negative     COVID 19 screen:  No recent travel or known exposure to COVID19 The patient denies respiratory symptoms of COVID 19 at this time. The importance of social distancing was discussed today.   Assessment and Plan Problem List Items  Addressed This Visit     Moderate recurrent major depression (HCC) (Chronic)    Chronic, inadequate control potentially contributing to cognitive symptoms.      Abnormal mammogram - Primary    Orders placed for bilateral ultrasound and bilateral diagnostic mammogram.      Relevant Orders   US BREAST LTD UNI LEFT INC AXILLA   US BREAST LTD UNI RIGHT INC AXILLA   Aphasia    Unclear if secondary to medication side effect from the medicine such as Ambien or new issue.  Will evaluate with labs.   If continued will consider doing MRI of the brain to evaluate further.  Encouraged her to speak with psychiatry about coming off Ambien.      Relevant Orders   Lamotrigine level   CBC with Differential/Platelet   TSH   VITAMIN D 25 Hydroxy (Vit-D Deficiency, Fractures)   Vitamin B12   Chest pressure    EKG showed normal sinus rhythm with no change from previous in 2022.  Her symptoms could potentially be due to a chest wall soreness following using clippers above her head leaning forward versus reflux symptoms versus anxiety versus GI source.  Given radiation of pain through the back I will evaluate with a complete metabolic panel and lipase.  She is status postcholecystectomy. She will continue PPI but we may need to increase this back to a higher dose.      Relevant Orders   Comprehensive metabolic panel   Lipase   EKG 12-Lead (Completed)   High risk medication  use   Relevant Orders   Lamotrigine level   Poor balance    Possibly due to medication side effect.  Evaluate with labs.  May need to evaluate with imaging of brain.  No red flags on exam today.  Normal neurologic exam.  No known head injury.      Relevant Orders   Lamotrigine level   CBC with Differential/Platelet   TSH   VITAMIN D 25 Hydroxy (Vit-D Deficiency, Fractures)   Vitamin B12   Orders Placed This Encounter  Procedures   US BREAST LTD UNI LEFT INC AXILLA    Standing Status:   Future    Standing  Expiration Date:   01/01/2023    Scheduling Instructions:     [facility]    Order Specific Question:   Reason for Exam (SYMPTOM  OR DIAGNOSIS REQUIRED)    Answer:   abnormal mammogram    Order Specific Question:   Preferred imaging location?    Answer:   External   US BREAST LTD UNI RIGHT INC AXILLA    Standing Status:   Future    Standing Expiration Date:   01/01/2023    Scheduling Instructions:     [facility]    Order Specific Question:   Reason for Exam (SYMPTOM  OR DIAGNOSIS REQUIRED)    Answer:   abnormal mammogram    Order Specific Question:   Preferred imaging location?    Answer:   External   Lamotrigine level   CBC with Differential/Platelet   TSH   VITAMIN D 25 Hydroxy (Vit-D Deficiency, Fractures)   Vitamin B12   Comprehensive metabolic panel   Lipase   EKG 12-Lead   She can start a baby aspirin 81 mg daily for stroke and heart disease prevention.    Eliezer Lofts, MD

## 2022-01-03 ENCOUNTER — Encounter: Payer: Self-pay | Admitting: Family Medicine

## 2022-01-03 LAB — COMPREHENSIVE METABOLIC PANEL
AG Ratio: 1.6 (calc) (ref 1.0–2.5)
ALT: 16 U/L (ref 6–29)
AST: 17 U/L (ref 10–35)
Albumin: 4.4 g/dL (ref 3.6–5.1)
Alkaline phosphatase (APISO): 86 U/L (ref 37–153)
BUN: 21 mg/dL (ref 7–25)
CO2: 27 mmol/L (ref 20–32)
Calcium: 10.6 mg/dL — ABNORMAL HIGH (ref 8.6–10.4)
Chloride: 103 mmol/L (ref 98–110)
Creat: 0.68 mg/dL (ref 0.50–1.03)
Globulin: 2.8 g/dL (calc) (ref 1.9–3.7)
Glucose, Bld: 91 mg/dL (ref 65–99)
Potassium: 4.2 mmol/L (ref 3.5–5.3)
Sodium: 139 mmol/L (ref 135–146)
Total Bilirubin: 0.4 mg/dL (ref 0.2–1.2)
Total Protein: 7.2 g/dL (ref 6.1–8.1)

## 2022-01-03 LAB — CBC WITH DIFFERENTIAL/PLATELET
Absolute Monocytes: 483 cells/uL (ref 200–950)
Basophils Absolute: 28 cells/uL (ref 0–200)
Basophils Relative: 0.4 %
Eosinophils Absolute: 112 cells/uL (ref 15–500)
Eosinophils Relative: 1.6 %
HCT: 35.6 % (ref 35.0–45.0)
Hemoglobin: 12.2 g/dL (ref 11.7–15.5)
Lymphs Abs: 3780 cells/uL (ref 850–3900)
MCH: 28.5 pg (ref 27.0–33.0)
MCHC: 34.3 g/dL (ref 32.0–36.0)
MCV: 83.2 fL (ref 80.0–100.0)
MPV: 12.2 fL (ref 7.5–12.5)
Monocytes Relative: 6.9 %
Neutro Abs: 2597 cells/uL (ref 1500–7800)
Neutrophils Relative %: 37.1 %
Platelets: 342 10*3/uL (ref 140–400)
RBC: 4.28 10*6/uL (ref 3.80–5.10)
RDW: 12.8 % (ref 11.0–15.0)
Total Lymphocyte: 54 %
WBC: 7 10*3/uL (ref 3.8–10.8)

## 2022-01-03 LAB — VITAMIN D 25 HYDROXY (VIT D DEFICIENCY, FRACTURES): Vit D, 25-Hydroxy: 23 ng/mL — ABNORMAL LOW (ref 30–100)

## 2022-01-03 LAB — LAMOTRIGINE LEVEL: Lamotrigine Lvl: 4.5 ug/mL (ref 2.5–15.0)

## 2022-01-03 LAB — LIPASE: Lipase: 38 U/L (ref 7–60)

## 2022-01-03 LAB — TSH: TSH: 0.58 mIU/L (ref 0.40–4.50)

## 2022-01-03 LAB — VITAMIN B12: Vitamin B-12: 381 pg/mL (ref 200–1100)

## 2022-01-03 NOTE — Telephone Encounter (Signed)
Christy faxed over new orders Friday.  I sent patient a MyChart message letting her know this has been done.

## 2022-01-04 DIAGNOSIS — F331 Major depressive disorder, recurrent, moderate: Secondary | ICD-10-CM | POA: Diagnosis not present

## 2022-01-04 DIAGNOSIS — F5081 Binge eating disorder: Secondary | ICD-10-CM | POA: Diagnosis not present

## 2022-01-05 ENCOUNTER — Telehealth: Payer: Self-pay | Admitting: Family Medicine

## 2022-01-05 ENCOUNTER — Other Ambulatory Visit: Payer: Self-pay | Admitting: Family Medicine

## 2022-01-05 MED ORDER — VITAMIN D (ERGOCALCIFEROL) 1.25 MG (50000 UNIT) PO CAPS: 50000.0000 [IU] | ORAL_CAPSULE | ORAL | 0 refills | Status: DC

## 2022-01-05 NOTE — Telephone Encounter (Addendum)
Spoke with Wells Guiles.  She states they received the orders for Ms. Coombes's bilateral diagnostic mammogram and ultrasound.  She states it is too early for patient to get this done unless there is something going on that they are not aware of. She is not due for bilateral mammogram until November/December.  They are asking if the orders should be just for follow up on right breast. Please advise.  Patient is scheduled for next Thursday.   I did find the recall letter the patient scanned into her MyChart and called Wells Guiles back to advise her of this letter.  After reviewing her chart on their end, they states since patient's biopsy came back benign she was released back to regular screening mammograms which patient would be due for in November.  They think she must have gotten put on the 6 month recall list prior to the biopsy and that is why she got the letter.   They will reach out to patient and reschedule patient for a routine screening mammogram in November.  Nothing further is needed on our end.

## 2022-01-05 NOTE — Telephone Encounter (Signed)
Wells Guiles from [facility] in [place] called and stated sent an order for diagnostic mammogram with ultrasound and thinking the patient may need something different. Call back number [phone] Option 7 and ask for [facility].

## 2022-01-11 ENCOUNTER — Encounter: Payer: Self-pay | Admitting: Family Medicine

## 2022-01-12 DIAGNOSIS — L821 Other seborrheic keratosis: Secondary | ICD-10-CM | POA: Diagnosis not present

## 2022-01-12 DIAGNOSIS — D225 Melanocytic nevi of trunk: Secondary | ICD-10-CM | POA: Diagnosis not present

## 2022-01-12 DIAGNOSIS — Z85828 Personal history of other malignant neoplasm of skin: Secondary | ICD-10-CM | POA: Diagnosis not present

## 2022-01-12 DIAGNOSIS — Z08 Encounter for follow-up examination after completed treatment for malignant neoplasm: Secondary | ICD-10-CM | POA: Diagnosis not present

## 2022-01-13 DIAGNOSIS — F5081 Binge eating disorder: Secondary | ICD-10-CM | POA: Diagnosis not present

## 2022-01-13 DIAGNOSIS — F331 Major depressive disorder, recurrent, moderate: Secondary | ICD-10-CM | POA: Diagnosis not present

## 2022-01-18 DIAGNOSIS — F5081 Binge eating disorder: Secondary | ICD-10-CM | POA: Diagnosis not present

## 2022-01-18 DIAGNOSIS — F331 Major depressive disorder, recurrent, moderate: Secondary | ICD-10-CM | POA: Diagnosis not present

## 2022-01-20 ENCOUNTER — Ambulatory Visit (INDEPENDENT_AMBULATORY_CARE_PROVIDER_SITE_OTHER): Payer: BC Managed Care – PPO | Admitting: Family Medicine

## 2022-01-20 ENCOUNTER — Encounter: Payer: Self-pay | Admitting: Family Medicine

## 2022-01-20 VITALS — BP 110/72 | HR 73 | Temp 98.3°F | Ht 63.25 in | Wt 171.0 lb

## 2022-01-20 DIAGNOSIS — R410 Disorientation, unspecified: Secondary | ICD-10-CM | POA: Diagnosis not present

## 2022-01-20 DIAGNOSIS — Z818 Family history of other mental and behavioral disorders: Secondary | ICD-10-CM | POA: Diagnosis not present

## 2022-01-20 DIAGNOSIS — F411 Generalized anxiety disorder: Secondary | ICD-10-CM

## 2022-01-20 DIAGNOSIS — R0789 Other chest pain: Secondary | ICD-10-CM

## 2022-01-20 DIAGNOSIS — R4701 Aphasia: Secondary | ICD-10-CM | POA: Diagnosis not present

## 2022-01-20 MED ORDER — ALPRAZOLAM 0.25 MG PO TABS: ORAL_TABLET | ORAL | 0 refills | Status: DC

## 2022-01-20 NOTE — Patient Instructions (Signed)
Move forward with psychiatric medication change. We will move forward with MRI of the brain as well as referral to neurology. If severe symptoms occur including new focal weakness increased confusion with pain go to the emergency room.

## 2022-01-20 NOTE — Progress Notes (Signed)
Patient ID: Brandi Terrell, female    DOB: 11-03-1965, 56 y.o.   MRN: [id number]  This visit was conducted in person.  BP 110/72   Pulse 73   Temp 98.3 F (36.8 C) (Oral)   Ht 5' 3.25" (1.607 m)   Wt 171 lb (77.6 kg)   LMP  (LMP Unknown)   SpO2 97%   BMI 30.05 kg/m    CC:  Chief Complaint  Patient presents with   Follow-up    Multiple Issues    Subjective:   HPI: Brandi Terrell is a 56 y.o. female presenting on 01/20/2022 for Follow-up (Multiple Issues)  At last office visit 2 weeks ago but noted chest pain following yard work.  EKG was unremarkable, blood pressure normal range and lab evaluation normal ( nml CMET, lipase, cbc, TSH).  It was felt that it was likely due to musculoskeletal strain, less likely due to anxiety versus GI source.   She had also been noted for balance, aphasia and feeling "weird".  Felt maybe it was secondary to medication side effect ( Ambien). Following her appointment she had an appointment with psychiatry to discuss Ambien and Lamictal use.  She had also noted worsening depression. Lamictal level was in the normal range. Vitamin D was found in the normal range and she restarted a supplement It still Per patient report today   chest pain has changed to  more of a pull in upper abdomen, only in morning better when up and moving around.  Seems to be waning. Appt with psychiatry ( Dr. Sheliah Plane at [facility]): has decreased dose of Ambein to 2.5 mg at bedtime, trying to stop over time. She reports she is still taking the  5 mg as she is worried about coming off.  She was given Vraylar as an adjunct but she has not started it yet as she is anxious about it... plans to start this weekend.  She continues to note episodes of difficulty finding words every few days.  Last 1-2 minutes.She feels spacy at times. No weakness, no numbness, no dizziness, no headache. No known head injury. Denies seizures.  Still feels like balance is off.. no lightheadeness or  vertigo.  Occ zings of tingling in right arm.  Describes  episodes of confusion.  Witness by husband per patient.  Mother with history of dementia.   Has appt with eye MD for watery eyes and pain behind right eye.     Relevant past medical, surgical, family and social history reviewed and updated as indicated. Interim medical history since our last visit reviewed. Allergies and medications reviewed and updated. Outpatient Medications Prior to Visit  Medication Sig Dispense Refill   amLODipine-valsartan (EXFORGE) 10-160 MG tablet TAKE 1 TABLET BY MOUTH EVERY DAY FOR BLOOD PRESSURE 90 tablet 1   lamoTRIgine (LAMICTAL) 100 MG tablet Take 200 mg by mouth daily.     liothyronine (CYTOMEL) 25 MCG tablet Take 25 mcg by mouth every morning.     Magnesium Citrate 100 MG CAPS Take 300 mg by mouth daily.     metoprolol succinate (TOPROL-XL) 100 MG 24 hr tablet TAKE 1 TABLET BY MOUTH EVERY DAY WITH OR IMMEDIATELY FOLLOWING A MEAL 90 tablet 3   omeprazole (PRILOSEC) 20 MG capsule Take 20 mg by mouth daily.     Vitamin D, Ergocalciferol, (DRISDOL) 1.25 MG (50000 UNIT) CAPS capsule Take 1 capsule (50,000 Units total) by mouth every 7 (seven) days. 12 capsule 0   VRAYLAR 1.5 MG  capsule Take 1.5 mg by mouth daily.     zolpidem (AMBIEN) 5 MG tablet Take 5 mg by mouth at bedtime.     METAMUCIL FIBER PO Take 2 Scoops by mouth daily.     zolpidem (AMBIEN) 10 MG tablet Take by mouth.     No facility-administered medications prior to visit.     Per HPI unless specifically indicated in ROS section below Review of Systems  Constitutional:  Negative for fatigue and fever.  HENT:  Negative for congestion.   Eyes:  Negative for pain.  Respiratory:  Negative for cough and shortness of breath.   Cardiovascular:  Positive for chest pain. Negative for palpitations and leg swelling.  Gastrointestinal:  Negative for abdominal pain.  Genitourinary:  Negative for dysuria and vaginal bleeding.  Musculoskeletal:   Negative for back pain.  Neurological:  Negative for syncope, light-headedness and headaches.  Psychiatric/Behavioral:  Negative for dysphoric mood.    Objective:  BP 110/72   Pulse 73   Temp 98.3 F (36.8 C) (Oral)   Ht 5' 3.25" (1.607 m)   Wt 171 lb (77.6 kg)   LMP  (LMP Unknown)   SpO2 97%   BMI 30.05 kg/m   Wt Readings from Last 3 Encounters:  01/20/22 171 lb (77.6 kg)  12/31/21 170 lb 8 oz (77.3 kg)  11/05/21 166 lb 3.2 oz (75.4 kg)      Physical Exam Constitutional:      General: She is not in acute distress.    Appearance: Normal appearance. She is well-developed. She is not ill-appearing or toxic-appearing.  HENT:     Head: Normocephalic.     Right Ear: Hearing, tympanic membrane, ear canal and external ear normal. Tympanic membrane is not erythematous, retracted or bulging.     Left Ear: Hearing, tympanic membrane, ear canal and external ear normal. Tympanic membrane is not erythematous, retracted or bulging.     Nose: No mucosal edema or rhinorrhea.     Right Sinus: No maxillary sinus tenderness or frontal sinus tenderness.     Left Sinus: No maxillary sinus tenderness or frontal sinus tenderness.     Mouth/Throat:     Pharynx: Uvula midline.  Eyes:     General: Lids are normal. Lids are everted, no foreign bodies appreciated.     Conjunctiva/sclera: Conjunctivae normal.     Pupils: Pupils are equal, round, and reactive to light.  Neck:     Thyroid: No thyroid mass or thyromegaly.     Vascular: No carotid bruit.     Trachea: Trachea normal.  Cardiovascular:     Rate and Rhythm: Normal rate and regular rhythm.     Pulses: Normal pulses.     Heart sounds: Normal heart sounds, S1 normal and S2 normal. No murmur heard.    No friction rub. No gallop.  Pulmonary:     Effort: Pulmonary effort is normal. No tachypnea or respiratory distress.     Breath sounds: Normal breath sounds. No decreased breath sounds, wheezing, rhonchi or rales.  Abdominal:     General:  Bowel sounds are normal.     Palpations: Abdomen is soft.     Tenderness: There is no abdominal tenderness.  Musculoskeletal:     Cervical back: Normal range of motion and neck supple.  Skin:    General: Skin is warm and dry.     Findings: No rash.  Neurological:     Mental Status: She is alert.  Psychiatric:  Mood and Affect: Mood is not anxious or depressed.        Speech: Speech normal.        Behavior: Behavior normal. Behavior is cooperative.        Thought Content: Thought content normal.        Judgment: Judgment normal.       Results for orders placed or performed in visit on 12/31/21  Lamotrigine level  Result Value Ref Range   Lamotrigine Lvl 4.5 2.5 - 15.0 mcg/mL  CBC with Differential/Platelet  Result Value Ref Range   WBC 7.0 3.8 - 10.8 Thousand/uL   RBC 4.28 3.80 - 5.10 Million/uL   Hemoglobin 12.2 11.7 - 15.5 g/dL   HCT 35.6 35.0 - 45.0 %   MCV 83.2 80.0 - 100.0 fL   MCH 28.5 27.0 - 33.0 pg   MCHC 34.3 32.0 - 36.0 g/dL   RDW 12.8 11.0 - 15.0 %   Platelets 342 140 - 400 Thousand/uL   MPV 12.2 7.5 - 12.5 fL   Neutro Abs 2,597 1,500 - 7,800 cells/uL   Lymphs Abs 3,780 850 - 3,900 cells/uL   Absolute Monocytes 483 200 - 950 cells/uL   Eosinophils Absolute 112 15 - 500 cells/uL   Basophils Absolute 28 0 - 200 cells/uL   Neutrophils Relative % 37.1 %   Total Lymphocyte 54.0 %   Monocytes Relative 6.9 %   Eosinophils Relative 1.6 %   Basophils Relative 0.4 %  TSH  Result Value Ref Range   TSH 0.58 0.40 - 4.50 mIU/L  VITAMIN D 25 Hydroxy (Vit-D Deficiency, Fractures)  Result Value Ref Range   Vit D, 25-Hydroxy 23 (L) 30 - 100 ng/mL  Vitamin B12  Result Value Ref Range   Vitamin B-12 381 200 - 1,100 pg/mL  Comprehensive metabolic panel  Result Value Ref Range   Glucose, Bld 91 65 - 99 mg/dL   BUN 21 7 - 25 mg/dL   Creat 0.68 0.50 - 1.03 mg/dL   BUN/Creatinine Ratio NOT APPLICABLE 6 - 22 (calc)   Sodium 139 135 - 146 mmol/L   Potassium 4.2 3.5 -  5.3 mmol/L   Chloride 103 98 - 110 mmol/L   CO2 27 20 - 32 mmol/L   Calcium 10.6 (H) 8.6 - 10.4 mg/dL   Total Protein 7.2 6.1 - 8.1 g/dL   Albumin 4.4 3.6 - 5.1 g/dL   Globulin 2.8 1.9 - 3.7 g/dL (calc)   AG Ratio 1.6 1.0 - 2.5 (calc)   Total Bilirubin 0.4 0.2 - 1.2 mg/dL   Alkaline phosphatase (APISO) 86 37 - 153 U/L   AST 17 10 - 35 U/L   ALT 16 6 - 29 U/L  Lipase  Result Value Ref Range   Lipase 38 7 - 60 U/L     COVID 19 screen:  No recent travel or known exposure to COVID19 The patient denies respiratory symptoms of COVID 19 at this time. The importance of social distancing was discussed today.   Assessment and Plan Problem List Items Addressed This Visit     Aphasia - Primary    Acute, otherwise normal neurologic exam.  Current issues and normal lab evaluation, we will move onto imaging for further evaluation.  Of note her mother has issues with dementia.  We will evaluate with MRI brain to rule out TIA/CVA. We will begin process for referral to neurology given this can take some time.      Relevant Orders   MR Brain  W Wo Contrast   Ambulatory referral to Neurology   Chest pressure    Negative EKG and lab evaluation.  Most likely due to musculoskeletal strain or anxiety.      GAD (generalized anxiety disorder)    Chronic, inadequate control  It is possible her anxiety is contributing to her other symptoms.  We discussed the changes her new psychiatrist is made and I have encouraged her to move forward with trial of Vraylar.      Relevant Medications   ALPRAZolam (XANAX) 0.25 MG tablet   Other Visit Diagnoses     Family history of dementia       Relevant Orders   MR Brain W Wo Contrast   Ambulatory referral to Neurology   Episodic confusion       Relevant Orders   MR Brain W Wo Contrast   Ambulatory referral to Neurology      Meds ordered this encounter  Medications   ALPRAZolam (XANAX) 0.25 MG tablet    Sig: 1-2 tabs prior to procedure     Dispense:  2 tablet    Refill:  0   Orders Placed This Encounter  Procedures   MR Brain W Wo Contrast    Standing Status:   Future    Number of Occurrences:   1    Standing Expiration Date:   01/21/2023    Order Specific Question:   If indicated for the ordered procedure, I authorize the administration of contrast media per Radiology protocol    Answer:   Yes    Order Specific Question:   What is the patient's sedation requirement?    Answer:   Anti-anxiety    Order Specific Question:   Does the patient have a pacemaker or implanted devices?    Answer:   No    Order Specific Question:   Preferred imaging location?    Answer:   [facility] (table limit - 550lbs)   Ambulatory referral to Neurology    Referral Priority:   Routine    Referral Type:   Consultation    Referral Reason:   Specialty Services Required    Requested Specialty:   Neurology    Number of Visits Requested:   1       Eliezer Lofts, MD

## 2022-01-27 DIAGNOSIS — F5081 Binge eating disorder: Secondary | ICD-10-CM | POA: Diagnosis not present

## 2022-01-27 DIAGNOSIS — F331 Major depressive disorder, recurrent, moderate: Secondary | ICD-10-CM | POA: Diagnosis not present

## 2022-02-05 ENCOUNTER — Ambulatory Visit
Admission: RE | Admit: 2022-02-05 | Discharge: 2022-02-05 | Disposition: A | Discharge status: Home / Self Care | Payer: BC Managed Care – PPO | Source: Ambulatory Visit | Attending: Family Medicine | Admitting: Family Medicine

## 2022-02-05 DIAGNOSIS — Z818 Family history of other mental and behavioral disorders: Secondary | ICD-10-CM | POA: Insufficient documentation

## 2022-02-05 DIAGNOSIS — R4701 Aphasia: Secondary | ICD-10-CM | POA: Diagnosis not present

## 2022-02-05 DIAGNOSIS — R410 Disorientation, unspecified: Secondary | ICD-10-CM | POA: Insufficient documentation

## 2022-02-05 MED ORDER — GADOBUTROL 1 MMOL/ML IV SOLN
7.0000 mL | Freq: Once | INTRAVENOUS | Status: AC | PRN
  Administered 2022-02-05: 7 mL via INTRAVENOUS

## 2022-02-05 NOTE — Assessment & Plan Note (Signed)
Negative EKG and lab evaluation.  Most likely due to musculoskeletal strain or anxiety.

## 2022-02-05 NOTE — Assessment & Plan Note (Signed)
Acute, otherwise normal neurologic exam.  Current issues and normal lab evaluation, we will move onto imaging for further evaluation.  Of note her mother has issues with dementia.  We will evaluate with MRI brain to rule out TIA/CVA. We will begin process for referral to neurology given this can take some time.

## 2022-02-05 NOTE — Assessment & Plan Note (Signed)
Chronic, inadequate control  It is possible her anxiety is contributing to her other symptoms.  We discussed the changes her new psychiatrist is made and I have encouraged her to move forward with trial of Vraylar.

## 2022-02-07 ENCOUNTER — Encounter: Payer: Self-pay | Admitting: Family Medicine

## 2022-02-07 ENCOUNTER — Other Ambulatory Visit: Payer: Self-pay | Admitting: Family Medicine

## 2022-02-10 ENCOUNTER — Encounter: Payer: Self-pay | Admitting: Family Medicine

## 2022-02-12 ENCOUNTER — Encounter: Payer: Self-pay | Admitting: Family Medicine

## 2022-02-15 ENCOUNTER — Other Ambulatory Visit: Payer: Self-pay | Admitting: Family Medicine

## 2022-02-17 ENCOUNTER — Ambulatory Visit: Payer: BC Managed Care – PPO | Admitting: Family Medicine

## 2022-02-17 ENCOUNTER — Encounter: Payer: Self-pay | Admitting: Family Medicine

## 2022-02-17 VITALS — BP 120/80 | HR 68 | Temp 98.6°F | Ht 63.25 in | Wt 172.5 lb

## 2022-02-17 DIAGNOSIS — K640 First degree hemorrhoids: Secondary | ICD-10-CM

## 2022-02-17 DIAGNOSIS — K921 Melena: Secondary | ICD-10-CM | POA: Diagnosis not present

## 2022-02-17 DIAGNOSIS — G4733 Obstructive sleep apnea (adult) (pediatric): Secondary | ICD-10-CM | POA: Diagnosis not present

## 2022-02-17 DIAGNOSIS — R4701 Aphasia: Secondary | ICD-10-CM | POA: Diagnosis not present

## 2022-02-17 MED ORDER — HYDROCORT-PRAMOXINE (PERIANAL) 2.5-1 % EX CREA: TOPICAL_CREAM | Freq: Three times a day (TID) | CUTANEOUS | 0 refills | Status: DC

## 2022-02-17 NOTE — Assessment & Plan Note (Signed)
Continued balance issues, occasional aphasia  Reviewed MRI results in detail with patient.  She has upcoming appointment with neurology. Very nonspecific changes possibly related to more extensive microvascular changes beyond expected for age.  Very small meningioma not likely to be symptomatic.  She has no history of migraine or severe past infection or inflammatory disease.  She does have sleep apnea that she has been unable to treat (cannot tolerate CPAP despite multiple masks, fear and anxiety associated with ENT oral appliance), I encouraged her to speak with the neurologist regarding this as it could potentially be contributing to her symptoms.   I do wonder if some of her symptoms could potentially be medication side effect or secondary to psychiatric issues.  She does also have an appointment upcoming with psychiatry for adjustment of medication.

## 2022-02-17 NOTE — Patient Instructions (Signed)
Keep appointment as planned with neurology to discuss current symptoms, MRI and sleep apnea Start topical steroid cream rectally for hemorrhoids. Increase fiber and water in diet to avoid constipation.

## 2022-02-17 NOTE — Assessment & Plan Note (Signed)
Acute This is most likely the cause of her bright red blood per rectum. No rectal mass seen.  Recommend treatment with topical steroid cream.  Treat constipation with increased fiber and water.

## 2022-02-17 NOTE — Progress Notes (Signed)
Patient ID: JAMA MCMILLER, female    DOB: 1965/08/21, 56 y.o.   MRN: [id number]  This visit was conducted in person.  BP 120/80   Pulse 68   Temp 98.6 F (37 C) (Oral)   Ht 5' 3.25" (1.607 m)   Wt 172 lb 8 oz (78.2 kg)   LMP  (LMP Unknown)   SpO2 98%   BMI 30.32 kg/m    CC:  Chief Complaint  Patient presents with   Blood In Stools   Results    Discuss MRI results    Subjective:   HPI: Brandi Terrell is a 56 y.o. female presenting on 02/17/2022 for Blood In Stools and Results (Discuss MRI results)  Blood in stool:  She has noted intermittent blood in stool for the past year.  Seemed to worsen with keto diet and resulting constipation.  She had improved on Magnesium.  She has BMs regularly  daily  in last month.   Every other day she sees bright red blood on  stool.. smears. No drips in toilet.  No pain with BMs.  She does have some rectal pressure, more on right.  No abdominal pain. She has noted some increase in size of hemorrhoids.   Dad with colo rectal cancer and Maternal aunt with rectal cancer.   Last colonoscopy 11/13/2019 Some skin tags and tics noted.. no hemorrhoids.  Aphasia and intermittent altered mental status: MRI brain 02/05/2022 reviewed with patient: Impression: 5 mm nodular dural-based enhancing focus overlying the high posterior left frontal lobe, likely reflecting a small meningioma. No significant mass effect upon the underlying brain parenchyma.   Multifocal T2 FLAIR hyperintense signal abnormality within the cerebral white matter, overall mild but advanced for age. These signal changes are nonspecific and differential considerations chronic small vessel ischemic disease, sequelae of chronic migraine headaches or sequelae of a prior infectious/inflammatory process, among others. The distribution would be atypical for demyelinating disease.     She does have sleep apnea... cannot find a mask that  she can tolerate.    Relevant  past medical, surgical, family and social history reviewed and updated as indicated. Interim medical history since our last visit reviewed. Allergies and medications reviewed and updated. Outpatient Medications Prior to Visit  Medication Sig Dispense Refill   amLODipine-valsartan (EXFORGE) 10-160 MG tablet TAKE 1 TABLET BY MOUTH EVERY DAY FOR BLOOD PRESSURE 90 tablet 1   lamoTRIgine (LAMICTAL) 100 MG tablet Take 200 mg by mouth daily.     liothyronine (CYTOMEL) 25 MCG tablet Take 25 mcg by mouth every morning.     Magnesium Citrate 100 MG CAPS Take 200 mg by mouth daily.     metoprolol succinate (TOPROL-XL) 100 MG 24 hr tablet TAKE 1 TABLET BY MOUTH EVERY DAY WITH OR IMMEDIATELY FOLLOWING A MEAL 90 tablet 3   omeprazole (PRILOSEC) 20 MG capsule Take 20 mg by mouth daily.     Vitamin D, Ergocalciferol, (DRISDOL) 1.25 MG (50000 UNIT) CAPS capsule Take 1 capsule (50,000 Units total) by mouth every 7 (seven) days. 12 capsule 0   zolpidem (AMBIEN) 5 MG tablet Take 5 mg by mouth at bedtime.     VRAYLAR 1.5 MG capsule Take 1.5 mg by mouth daily. (Patient not taking: Reported on 02/17/2022)     ALPRAZolam (XANAX) 0.25 MG tablet 1-2 tabs prior to procedure 2 tablet 0   No facility-administered medications prior to visit.     Per HPI unless specifically indicated in ROS section below Review  of Systems  Constitutional:  Negative for fatigue and fever.  HENT:  Negative for congestion.   Eyes:  Negative for pain.  Respiratory:  Negative for cough and shortness of breath.   Cardiovascular:  Negative for chest pain, palpitations and leg swelling.  Gastrointestinal:  Negative for abdominal pain.  Genitourinary:  Negative for dysuria and vaginal bleeding.  Musculoskeletal:  Negative for back pain.  Neurological:  Negative for syncope, light-headedness and headaches.  Psychiatric/Behavioral:  Negative for dysphoric mood.    Objective:  BP 120/80   Pulse 68   Temp 98.6 F (37 C) (Oral)   Ht 5' 3.25"  (1.607 m)   Wt 172 lb 8 oz (78.2 kg)   LMP  (LMP Unknown)   SpO2 98%   BMI 30.32 kg/m   Wt Readings from Last 3 Encounters:  02/17/22 172 lb 8 oz (78.2 kg)  01/20/22 171 lb (77.6 kg)  12/31/21 170 lb 8 oz (77.3 kg)      Physical Exam Exam conducted with a chaperone present.  Constitutional:      General: She is not in acute distress.    Appearance: Normal appearance. She is well-developed. She is not ill-appearing or toxic-appearing.  HENT:     Head: Normocephalic.     Right Ear: Hearing, tympanic membrane, ear canal and external ear normal. Tympanic membrane is not erythematous, retracted or bulging.     Left Ear: Hearing, tympanic membrane, ear canal and external ear normal. Tympanic membrane is not erythematous, retracted or bulging.     Nose: No mucosal edema or rhinorrhea.     Right Sinus: No maxillary sinus tenderness or frontal sinus tenderness.     Left Sinus: No maxillary sinus tenderness or frontal sinus tenderness.     Mouth/Throat:     Pharynx: Uvula midline.  Eyes:     General: Lids are normal. Lids are everted, no foreign bodies appreciated.     Conjunctiva/sclera: Conjunctivae normal.     Pupils: Pupils are equal, round, and reactive to light.  Neck:     Thyroid: No thyroid mass or thyromegaly.     Vascular: No carotid bruit.     Trachea: Trachea normal.  Cardiovascular:     Rate and Rhythm: Normal rate and regular rhythm.     Pulses: Normal pulses.     Heart sounds: Normal heart sounds, S1 normal and S2 normal. No murmur heard.    No friction rub. No gallop.  Pulmonary:     Effort: Pulmonary effort is normal. No tachypnea or respiratory distress.     Breath sounds: Normal breath sounds. No decreased breath sounds, wheezing, rhonchi or rales.  Abdominal:     General: Bowel sounds are normal.     Palpations: Abdomen is soft.     Tenderness: There is no abdominal tenderness.  Genitourinary:    Rectum: Guaiac result positive. Internal hemorrhoid present. No  tenderness.     Comments: Peri-anal skin tags Musculoskeletal:     Cervical back: Normal range of motion and neck supple.  Skin:    General: Skin is warm and dry.     Findings: No rash.  Neurological:     Mental Status: She is alert.  Psychiatric:        Mood and Affect: Mood is not anxious or depressed.        Speech: Speech normal.        Behavior: Behavior normal. Behavior is cooperative.        Thought Content: Thought  content normal.        Judgment: Judgment normal.      ANOSCOPY: After explaining the procedure, informed consent was obtained.   Using the anoscope instrument, anoscopy was carried out.  Proceeded to 2-3 cm.  Findings: normal mucosa without polyps, tumors or diverticula,  Grade 1 internal hemorrhoids noted.  No complications were encountered;  the procedure was well  tolerated.    ASSESSMENT:  Perianal skin tags and grade 1 internal hemorrhoids   Results for orders placed or performed in visit on 12/31/21  Lamotrigine level  Result Value Ref Range   Lamotrigine Lvl 4.5 2.5 - 15.0 mcg/mL  CBC with Differential/Platelet  Result Value Ref Range   WBC 7.0 3.8 - 10.8 Thousand/uL   RBC 4.28 3.80 - 5.10 Million/uL   Hemoglobin 12.2 11.7 - 15.5 g/dL   HCT 35.6 35.0 - 45.0 %   MCV 83.2 80.0 - 100.0 fL   MCH 28.5 27.0 - 33.0 pg   MCHC 34.3 32.0 - 36.0 g/dL   RDW 12.8 11.0 - 15.0 %   Platelets 342 140 - 400 Thousand/uL   MPV 12.2 7.5 - 12.5 fL   Neutro Abs 2,597 1,500 - 7,800 cells/uL   Lymphs Abs 3,780 850 - 3,900 cells/uL   Absolute Monocytes 483 200 - 950 cells/uL   Eosinophils Absolute 112 15 - 500 cells/uL   Basophils Absolute 28 0 - 200 cells/uL   Neutrophils Relative % 37.1 %   Total Lymphocyte 54.0 %   Monocytes Relative 6.9 %   Eosinophils Relative 1.6 %   Basophils Relative 0.4 %  TSH  Result Value Ref Range   TSH 0.58 0.40 - 4.50 mIU/L  VITAMIN D 25 Hydroxy (Vit-D Deficiency, Fractures)  Result Value Ref Range   Vit D, 25-Hydroxy 23  (L) 30 - 100 ng/mL  Vitamin B12  Result Value Ref Range   Vitamin B-12 381 200 - 1,100 pg/mL  Comprehensive metabolic panel  Result Value Ref Range   Glucose, Bld 91 65 - 99 mg/dL   BUN 21 7 - 25 mg/dL   Creat 0.68 0.50 - 1.03 mg/dL   BUN/Creatinine Ratio NOT APPLICABLE 6 - 22 (calc)   Sodium 139 135 - 146 mmol/L   Potassium 4.2 3.5 - 5.3 mmol/L   Chloride 103 98 - 110 mmol/L   CO2 27 20 - 32 mmol/L   Calcium 10.6 (H) 8.6 - 10.4 mg/dL   Total Protein 7.2 6.1 - 8.1 g/dL   Albumin 4.4 3.6 - 5.1 g/dL   Globulin 2.8 1.9 - 3.7 g/dL (calc)   AG Ratio 1.6 1.0 - 2.5 (calc)   Total Bilirubin 0.4 0.2 - 1.2 mg/dL   Alkaline phosphatase (APISO) 86 37 - 153 U/L   AST 17 10 - 35 U/L   ALT 16 6 - 29 U/L  Lipase  Result Value Ref Range   Lipase 38 7 - 60 U/L     COVID 19 screen:  No recent travel or known exposure to COVID19 The patient denies respiratory symptoms of COVID 19 at this time. The importance of social distancing was discussed today.   Assessment and Plan Problem List Items Addressed This Visit     Obstructive sleep apnea (Chronic)   Aphasia - Primary    Continued balance issues, occasional aphasia  Reviewed MRI results in detail with patient.  She has upcoming appointment with neurology. Very nonspecific changes possibly related to more extensive microvascular changes beyond expected for age.  Very  small meningioma not likely to be symptomatic.  She has no history of migraine or severe past infection or inflammatory disease.  She does have sleep apnea that she has been unable to treat (cannot tolerate CPAP despite multiple masks, fear and anxiety associated with ENT oral appliance), I encouraged her to speak with the neurologist regarding this as it could potentially be contributing to her symptoms.   I do wonder if some of her symptoms could potentially be medication side effect or secondary to psychiatric issues.  She does also have an appointment upcoming with psychiatry for  adjustment of medication.      Blood in stool    Most likely secondary to internal hemorrhoids.      Grade I internal hemorrhoids    Acute This is most likely the cause of her bright red blood per rectum. No rectal mass seen.  Recommend treatment with topical steroid cream.  Treat constipation with increased fiber and water.          Eliezer Lofts, MD

## 2022-02-17 NOTE — Assessment & Plan Note (Signed)
Most likely secondary to internal hemorrhoids.

## 2022-02-18 ENCOUNTER — Encounter: Payer: Self-pay | Admitting: Family Medicine

## 2022-02-22 ENCOUNTER — Telehealth: Payer: Self-pay | Admitting: *Deleted

## 2022-02-22 ENCOUNTER — Other Ambulatory Visit: Payer: Self-pay | Admitting: Family Medicine

## 2022-02-22 MED ORDER — PRAMOXINE-HC 1-2.35 % EX CREA: TOPICAL_CREAM | CUTANEOUS | 0 refills | Status: DC

## 2022-02-22 NOTE — Telephone Encounter (Signed)
Received fax from [facility] stating [facility] Pramoxine 2.5-1% cream is not covered.  Pharmacy is asking for different medication.

## 2022-02-23 NOTE — Telephone Encounter (Signed)
Dr. Diona Browner sent in Pramoxine-HC 1-2.35 % CREAM.

## 2022-02-24 MED ORDER — HYDROCORTISONE ACETATE 25 MG RE SUPP: 25.0000 mg | Freq: Every day | RECTAL | 0 refills | Status: DC

## 2022-02-24 NOTE — Telephone Encounter (Signed)
Sent in prescription for cortisone suppositories for her to use to treat symptoms .

## 2022-02-24 NOTE — Addendum Note (Signed)
Addended by: Eliezer Lofts E on: 02/24/2022 10:31 AM   Modules accepted: Orders

## 2022-02-24 NOTE — Telephone Encounter (Signed)
PA denied.  The requested in not a covered benefit per benefit booklet or plan documents. FYI to Dr. Diona Browner.

## 2022-02-24 NOTE — Telephone Encounter (Signed)
Left message for Brandi Terrell that her insurance denied PA for cream so now Dr. Diona Browner has sent her in a Rx for suppositories to see if her insurance will cover that.   I ask that she call me back if she has any questions.

## 2022-02-24 NOTE — Telephone Encounter (Signed)
Received fax from [facility] requesting PA for Pramosone 1%-2.5% cream.  PA completed on CoverMyMeds and sent for review.  Can take up to 72 hours for a decision.

## 2022-02-28 DIAGNOSIS — H40003 Preglaucoma, unspecified, bilateral: Secondary | ICD-10-CM | POA: Diagnosis not present

## 2022-02-28 DIAGNOSIS — Z0100 Encounter for examination of eyes and vision without abnormal findings: Secondary | ICD-10-CM | POA: Diagnosis not present

## 2022-03-30 ENCOUNTER — Other Ambulatory Visit: Payer: Self-pay | Admitting: Family Medicine

## 2022-03-30 NOTE — Telephone Encounter (Signed)
Last office visit 02/17/22 for Aphasia.  Last refilled 01/05/22 for #12 with no refills.  Last Vit D 12/31/21 which was low at 23 ng/mL.  CPE 05/20/22.

## 2022-03-31 DIAGNOSIS — F3181 Bipolar II disorder: Secondary | ICD-10-CM | POA: Diagnosis not present

## 2022-03-31 DIAGNOSIS — F5101 Primary insomnia: Secondary | ICD-10-CM | POA: Diagnosis not present

## 2022-04-02 DIAGNOSIS — F331 Major depressive disorder, recurrent, moderate: Secondary | ICD-10-CM | POA: Diagnosis not present

## 2022-04-02 DIAGNOSIS — F5081 Binge eating disorder: Secondary | ICD-10-CM | POA: Diagnosis not present

## 2022-04-15 DIAGNOSIS — Z23 Encounter for immunization: Secondary | ICD-10-CM | POA: Diagnosis not present

## 2022-04-15 DIAGNOSIS — F331 Major depressive disorder, recurrent, moderate: Secondary | ICD-10-CM | POA: Diagnosis not present

## 2022-04-15 DIAGNOSIS — F5081 Binge eating disorder: Secondary | ICD-10-CM | POA: Diagnosis not present

## 2022-04-22 NOTE — Telephone Encounter (Signed)
Patient seen 04/21/2022 with Dr Manuella Ghazi [facility]

## 2022-04-27 DIAGNOSIS — F5081 Binge eating disorder: Secondary | ICD-10-CM | POA: Diagnosis not present

## 2022-04-27 DIAGNOSIS — F331 Major depressive disorder, recurrent, moderate: Secondary | ICD-10-CM | POA: Diagnosis not present

## 2022-04-28 DIAGNOSIS — D329 Benign neoplasm of meninges, unspecified: Secondary | ICD-10-CM | POA: Diagnosis not present

## 2022-04-28 DIAGNOSIS — R4789 Other speech disturbances: Secondary | ICD-10-CM | POA: Diagnosis not present

## 2022-04-28 DIAGNOSIS — R4189 Other symptoms and signs involving cognitive functions and awareness: Secondary | ICD-10-CM | POA: Diagnosis not present

## 2022-04-28 DIAGNOSIS — F418 Other specified anxiety disorders: Secondary | ICD-10-CM | POA: Diagnosis not present

## 2022-04-28 DIAGNOSIS — G4733 Obstructive sleep apnea (adult) (pediatric): Secondary | ICD-10-CM | POA: Diagnosis not present

## 2022-04-28 DIAGNOSIS — Z118 Encounter for screening for other infectious and parasitic diseases: Secondary | ICD-10-CM | POA: Diagnosis not present

## 2022-05-09 ENCOUNTER — Other Ambulatory Visit: Payer: Self-pay | Admitting: Family Medicine

## 2022-05-10 ENCOUNTER — Other Ambulatory Visit (INDEPENDENT_AMBULATORY_CARE_PROVIDER_SITE_OTHER): Payer: BC Managed Care – PPO

## 2022-05-10 DIAGNOSIS — R7303 Prediabetes: Secondary | ICD-10-CM | POA: Diagnosis not present

## 2022-05-10 DIAGNOSIS — I10 Essential (primary) hypertension: Secondary | ICD-10-CM

## 2022-05-10 DIAGNOSIS — E538 Deficiency of other specified B group vitamins: Secondary | ICD-10-CM

## 2022-05-10 DIAGNOSIS — E7800 Pure hypercholesterolemia, unspecified: Secondary | ICD-10-CM

## 2022-05-10 DIAGNOSIS — F5081 Binge eating disorder: Secondary | ICD-10-CM | POA: Diagnosis not present

## 2022-05-10 DIAGNOSIS — F331 Major depressive disorder, recurrent, moderate: Secondary | ICD-10-CM | POA: Diagnosis not present

## 2022-05-10 LAB — LIPID PANEL
Cholesterol: 200 mg/dL (ref 0–200)
HDL: 54.5 mg/dL (ref >39.00)
LDL Cholesterol: 127 mg/dL — ABNORMAL HIGH (ref 0–99)
NonHDL: 145.6
Total CHOL/HDL Ratio: 4
Triglycerides: 94 mg/dL (ref 0.0–149.0)
VLDL: 18.8 mg/dL (ref 0.0–40.0)

## 2022-05-10 LAB — COMPREHENSIVE METABOLIC PANEL
ALT: 16 U/L (ref 0–35)
AST: 17 U/L (ref 0–37)
Albumin: 4.5 g/dL (ref 3.5–5.2)
Alkaline Phosphatase: 112 U/L (ref 39–117)
BUN: 18 mg/dL (ref 6–23)
CO2: 30 mEq/L (ref 19–32)
Calcium: 10.3 mg/dL (ref 8.4–10.5)
Chloride: 104 mEq/L (ref 96–112)
Creatinine, Ser: 0.67 mg/dL (ref 0.40–1.20)
GFR: 96.16 mL/min (ref >60.00)
Glucose, Bld: 103 mg/dL — ABNORMAL HIGH (ref 70–99)
Potassium: 4 mEq/L (ref 3.5–5.1)
Sodium: 138 mEq/L (ref 135–145)
Total Bilirubin: 0.4 mg/dL (ref 0.2–1.2)
Total Protein: 7.1 g/dL (ref 6.0–8.3)

## 2022-05-10 LAB — T4, FREE: Free T4: 0.54 ng/dL — ABNORMAL LOW (ref 0.60–1.60)

## 2022-05-10 LAB — TSH: TSH: 1.27 u[IU]/mL (ref 0.35–5.50)

## 2022-05-10 LAB — T3, FREE: T3, Free: 4.8 pg/mL — ABNORMAL HIGH (ref 2.3–4.2)

## 2022-05-10 LAB — VITAMIN B12: Vitamin B-12: 262 pg/mL (ref 211–911)

## 2022-05-10 LAB — HEMOGLOBIN A1C: Hgb A1c MFr Bld: 6.1 % (ref 4.6–6.5)

## 2022-05-10 NOTE — Progress Notes (Signed)
No critical labs need to be addressed urgently. We will discuss labs in detail at upcoming office visit.   

## 2022-05-20 ENCOUNTER — Encounter: Payer: Self-pay | Admitting: Family Medicine

## 2022-05-20 ENCOUNTER — Ambulatory Visit (INDEPENDENT_AMBULATORY_CARE_PROVIDER_SITE_OTHER): Payer: BC Managed Care – PPO | Admitting: Family Medicine

## 2022-05-20 VITALS — BP 130/80 | HR 79 | Temp 99.0°F | Ht 63.5 in | Wt 178.0 lb

## 2022-05-20 DIAGNOSIS — I10 Essential (primary) hypertension: Secondary | ICD-10-CM

## 2022-05-20 DIAGNOSIS — E7800 Pure hypercholesterolemia, unspecified: Secondary | ICD-10-CM

## 2022-05-20 DIAGNOSIS — R7989 Other specified abnormal findings of blood chemistry: Secondary | ICD-10-CM | POA: Insufficient documentation

## 2022-05-20 DIAGNOSIS — R52 Pain, unspecified: Secondary | ICD-10-CM

## 2022-05-20 DIAGNOSIS — R4701 Aphasia: Secondary | ICD-10-CM

## 2022-05-20 DIAGNOSIS — Z0000 Encounter for general adult medical examination without abnormal findings: Secondary | ICD-10-CM | POA: Diagnosis not present

## 2022-05-20 DIAGNOSIS — Z79899 Other long term (current) drug therapy: Secondary | ICD-10-CM

## 2022-05-20 DIAGNOSIS — F331 Major depressive disorder, recurrent, moderate: Secondary | ICD-10-CM

## 2022-05-20 DIAGNOSIS — R7303 Prediabetes: Secondary | ICD-10-CM

## 2022-05-20 LAB — CK: Total CK: 35 U/L (ref 7–177)

## 2022-05-20 NOTE — Patient Instructions (Signed)
Please stop at the lab to have labs drawn.  

## 2022-05-20 NOTE — Progress Notes (Signed)
Patient ID: Brandi Terrell, female    DOB: [birth year], 56 y.o.   MRN: [id number]  This visit was conducted in person.  Pulse 79   Temp 99 F (37.2 C) (Oral)   Ht 5' 3.5" (1.613 m)   Wt 178 lb (80.7 kg)   LMP  (LMP Unknown)   SpO2 96%   BMI 31.04 kg/m    CC:  Chief Complaint  Patient presents with   Annual Exam    Subjective:   HPI: Brandi Terrell is a 56 y.o. female presenting on 05/20/2022 for Annual Exam  Hypertension:  Well-controlled on amlodipine valsartan 10/160 mg p.o. daily, Toprol-XL 100 mg p.o. daily  BP Readings from Last 3 Encounters:  05/20/22 130/80  02/17/22 120/80  01/20/22 110/72  Using medication without problems or lightheadedness:  none Chest pain with exertion: none Edema: none Short of breath: Average home BPs: Other issues:   Elevated Cholesterol: Tolerable control with diet Lab Results  Component Value Date   CHOL 200 05/10/2022   HDL 54.50 05/10/2022   LDLCALC 127 (H) 05/10/2022   TRIG 94.0 05/10/2022   CHOLHDL 4 05/10/2022  The 10-year ASCVD risk score (Arnett DK, et al., 2019) is: 3.7%   Values used to calculate the score:     Age: 56 years     Sex: Female     Is Non-Hispanic African American: No     Diabetic: No     Tobacco smoker: No     Systolic Blood Pressure: 607 mmHg     Is BP treated: Yes     HDL Cholesterol: 54.5 mg/dL     Total Cholesterol: 200 mg/dL Using medications without problems: Muscle aches:  Diet compliance: Heart healthy low-carb diet Exercise: Regular Other complaints:   Prediabetes stable control.  Low-carb diet Lab Results  Component Value Date   HGBA1C 6.1 05/10/2022      MDD, GAD, chronic insomnia: per pt slgihtly worse  given now caregiver to  aunt. Seeing psychologist.  On lamotrigine 200 mg daily and liothyronine ( to enhance the lamictal) per past  psychiatrist, Dr. Scot Dock On zolpidem 5 mg p.o. nightly  Saw Dr. Buddy Duty.. did not feel comfortable.. she contacted Dr. Scot Dock to make other  recommendations.  PHQ9: 5 1/2 to 6  Balance issues an episode of aphasia earlier in the year. Saw psychiatrist and neurologist  Reviewed OV note from Dr. Manuella Ghazi 04/28/22 Lab evaluation performed, follow-up pending in 2 months.  She comments today on achiness, body aches .Marland Kitchen Noted in last month.  Some stumbling when getting up in AM.  T3 slightly elevated elevated, TSH normal but  free T 4low. She is on liothyronine   Relevant past medical, surgical, family and social history reviewed and updated as indicated. Interim medical history since our last visit reviewed. Allergies and medications reviewed and updated. Outpatient Medications Prior to Visit  Medication Sig Dispense Refill   amLODipine-valsartan (EXFORGE) 10-160 MG tablet TAKE 1 TABLET BY MOUTH EVERY DAY FOR BLOOD PRESSURE 90 tablet 1   lamoTRIgine (LAMICTAL) 200 MG tablet Take 200 mg by mouth daily.     liothyronine (CYTOMEL) 25 MCG tablet Take 25 mcg by mouth every morning.     Magnesium Citrate 100 MG CAPS Take 200 mg by mouth daily.     metoprolol succinate (TOPROL-XL) 100 MG 24 hr tablet TAKE 1 TABLET BY MOUTH EVERY DAY WITH OR IMMEDIATELY FOLLOWING A MEAL 90 tablet 0   omeprazole (PRILOSEC) 20 MG capsule Take  20 mg by mouth daily.     Vitamin D, Ergocalciferol, (DRISDOL) 1.25 MG (50000 UNIT) CAPS capsule TAKE 1 CAPSULE BY MOUTH EVERY 7 DAYS 12 capsule 0   zolpidem (AMBIEN) 5 MG tablet Take 5 mg by mouth at bedtime.     hydrocortisone (ANUSOL-HC) 25 MG suppository Place 1 suppository (25 mg total) rectally daily. 6 suppository 0   lamoTRIgine (LAMICTAL) 100 MG tablet Take 200 mg by mouth daily.     Pramoxine-HC 1-2.35 % CREA Apply to rectum three times daily as needed 28.4 g 0   VRAYLAR 1.5 MG capsule Take 1.5 mg by mouth daily. (Patient not taking: Reported on 02/17/2022)     No facility-administered medications prior to visit.     Per HPI unless specifically indicated in ROS section below Review of Systems Objective:   Pulse 79   Temp 99 F (37.2 C) (Oral)   Ht 5' 3.5" (1.613 m)   Wt 178 lb (80.7 kg)   LMP  (LMP Unknown)   SpO2 96%   BMI 31.04 kg/m   Wt Readings from Last 3 Encounters:  05/20/22 178 lb (80.7 kg)  02/17/22 172 lb 8 oz (78.2 kg)  01/20/22 171 lb (77.6 kg)      Physical Exam    Results for orders placed or performed in visit on 05/10/22  T3, free  Result Value Ref Range   T3, Free 4.8 (H) 2.3 - 4.2 pg/mL  T4, free  Result Value Ref Range   Free T4 0.54 (L) 0.60 - 1.60 ng/dL  TSH  Result Value Ref Range   TSH 1.27 0.35 - 5.50 uIU/mL  Vitamin B12  Result Value Ref Range   Vitamin B-12 262 211 - 911 pg/mL  Comprehensive metabolic panel  Result Value Ref Range   Sodium 138 135 - 145 mEq/L   Potassium 4.0 3.5 - 5.1 mEq/L   Chloride 104 96 - 112 mEq/L   CO2 30 19 - 32 mEq/L   Glucose, Bld 103 (H) 70 - 99 mg/dL   BUN 18 6 - 23 mg/dL   Creatinine, Ser 0.67 0.40 - 1.20 mg/dL   Total Bilirubin 0.4 0.2 - 1.2 mg/dL   Alkaline Phosphatase 112 39 - 117 U/L   AST 17 0 - 37 U/L   ALT 16 0 - 35 U/L   Total Protein 7.1 6.0 - 8.3 g/dL   Albumin 4.5 3.5 - 5.2 g/dL   GFR 96.16 >60.00 mL/min   Calcium 10.3 8.4 - 10.5 mg/dL  Lipid panel  Result Value Ref Range   Cholesterol 200 0 - 200 mg/dL   Triglycerides 94.0 0.0 - 149.0 mg/dL   HDL 54.50 >39.00 mg/dL   VLDL 18.8 0.0 - 40.0 mg/dL   LDL Cholesterol 127 (H) 0 - 99 mg/dL   Total CHOL/HDL Ratio 4    NonHDL 145.60   Hemoglobin A1c  Result Value Ref Range   Hgb A1c MFr Bld 6.1 4.6 - 6.5 %     COVID 19 screen:  No recent travel or known exposure to COVID19 The patient denies respiratory symptoms of COVID 19 at this time. The importance of social distancing was discussed today.   Assessment and Plan The patient's preventative maintenance and recommended screening tests for an annual wellness exam were reviewed in full today. Brought up to date unless services declined.  Counselled on the importance of diet, exercise, and  its role in overall health and mortality. The patient's FH and SH was reviewed, including  their home life, tobacco status, and drug and alcohol status.   Vaccines: uptodate flu, Td, shingrix and COVID x 3, Mammo: 03/30/2021   hysteroscopy  06/2021 Colon: 11/13/2019, father with colon cancer , plan repeat in 3-5 years Smoking Status: nonsmoker  ETOH; social  Hep C:  done  HIV screen:   refused    Problem List Items Addressed This Visit     Hypercholesterolemia (Chronic)    Tolerable control with diet      Relevant Medications   aspirin EC 81 MG tablet   Hypertension (Chronic)    Stable, chronic continue current medication Exforge 10/160 mg p.o. daily Metoprolol 100 mg XL p.o. daily      Relevant Medications   aspirin EC 81 MG tablet   Moderate recurrent major depression (HCC) (Chronic)    Chronic, moderate control  per pt slgihtly worse  given now caregiver to  aunt. Seeing psychologist.  On lamotrigine 200 mg daily and liothyronine ( to enhance the lamictal) per past  psychiatrist, Dr. Scot Dock On zolpidem 5 mg p.o. nightly  Saw Dr. Buddy Duty.. did not feel comfortable.. she contacted Dr. Scot Dock to make other recommendations.      Prediabetes (Chronic)    Chronic stable control with low-carb diet.  Continue working on lifestyle changes.      Abnormal serum thyroxine (T4) level    Reevaluate with labs.      Aphasia    Reviewed OV note from Dr. Manuella Ghazi 04/28/22 Lab evaluation performed, follow-up pending in 2 months.      Body aches    Noted new body aches possible side effect to Lamictal.  Eval with lamotrigine level. Will check CK for muscle breakdown. Possible fibromyalgia.      Relevant Orders   CK (Completed)   Other Visit Diagnoses     Routine general medical examination at a health care facility    -  Primary   High risk medication use       Relevant Orders   Lamotrigine level (Completed)       Eliezer Lofts, MD

## 2022-05-23 LAB — LAMOTRIGINE LEVEL: Lamotrigine Lvl: 6.6 ug/mL (ref 2.5–15.0)

## 2022-05-31 DIAGNOSIS — F331 Major depressive disorder, recurrent, moderate: Secondary | ICD-10-CM | POA: Diagnosis not present

## 2022-05-31 DIAGNOSIS — F5081 Binge eating disorder: Secondary | ICD-10-CM | POA: Diagnosis not present

## 2022-06-07 ENCOUNTER — Encounter: Payer: Self-pay | Admitting: Family Medicine

## 2022-06-21 DIAGNOSIS — Z1231 Encounter for screening mammogram for malignant neoplasm of breast: Secondary | ICD-10-CM | POA: Diagnosis not present

## 2022-06-21 LAB — HM MAMMOGRAPHY

## 2022-06-23 ENCOUNTER — Ambulatory Visit (INDEPENDENT_AMBULATORY_CARE_PROVIDER_SITE_OTHER)
Admission: RE | Admit: 2022-06-23 | Discharge: 2022-06-23 | Disposition: A | Discharge status: Home / Self Care | Payer: BC Managed Care – PPO | Source: Ambulatory Visit | Attending: Family Medicine | Admitting: Family Medicine

## 2022-06-23 ENCOUNTER — Other Ambulatory Visit: Payer: Self-pay | Admitting: Family Medicine

## 2022-06-23 ENCOUNTER — Ambulatory Visit: Payer: BC Managed Care – PPO | Admitting: Family Medicine

## 2022-06-23 ENCOUNTER — Encounter: Payer: Self-pay | Admitting: Family Medicine

## 2022-06-23 VITALS — BP 126/76 | HR 69 | Temp 97.0°F | Resp 16 | Ht 63.5 in | Wt 183.1 lb

## 2022-06-23 DIAGNOSIS — M25562 Pain in left knee: Secondary | ICD-10-CM

## 2022-06-23 DIAGNOSIS — Z818 Family history of other mental and behavioral disorders: Secondary | ICD-10-CM | POA: Diagnosis not present

## 2022-06-23 DIAGNOSIS — R4701 Aphasia: Secondary | ICD-10-CM

## 2022-06-23 DIAGNOSIS — S0993XS Unspecified injury of face, sequela: Secondary | ICD-10-CM | POA: Diagnosis not present

## 2022-06-23 DIAGNOSIS — R52 Pain, unspecified: Secondary | ICD-10-CM

## 2022-06-23 DIAGNOSIS — S0993XA Unspecified injury of face, initial encounter: Secondary | ICD-10-CM | POA: Insufficient documentation

## 2022-06-23 NOTE — Assessment & Plan Note (Signed)
Chronic, currently followed by neurology.  She has upcoming appointment with Dr. Brigitte Pulse in the next few weeks.  It is difficult to assess whether her symptoms are related to her history of moderately to poorly controlled depression.  She has decided to move forward with neurocognitive testing and I have placed a referral today.

## 2022-06-23 NOTE — Assessment & Plan Note (Signed)
Stable, chronic continue current medication Exforge 10/160 mg p.o. daily Metoprolol 100 mg XL p.o. daily

## 2022-06-23 NOTE — Assessment & Plan Note (Signed)
Chronic stable control with low-carb diet.  Continue working on lifestyle changes.

## 2022-06-23 NOTE — Assessment & Plan Note (Signed)
Tolerable control with diet

## 2022-06-23 NOTE — Progress Notes (Signed)
Patient ID: CHAVA DULAC, female    DOB: 09-05-1965, 57 y.o.   MRN: [id number]  This visit was conducted in person.  BP 126/76   Pulse 69   Temp (!) 97 F (36.1 C) (Temporal)   Resp 16   Ht 5' 3.5" (1.613 m)   Wt 183 lb 2 oz (83.1 kg)   LMP  (LMP Unknown)   SpO2 99%   BMI 31.93 kg/m    CC:  Chief Complaint  Patient presents with   Head Injury   Referral   Knee Pain    Left X couple weeks    Subjective:   HPI: MONICK RENA is a 57 y.o. female presenting on 06/23/2022 for Head Injury, Referral, and Knee Pain (Left X couple weeks)   1 week after Thanksgiving.. had plate hit central forehead betweens eye... she still has small hematoma there and pain... in last 2 days it has improved some.  Was having some headache in area of injury but has resolved now. No nose injury.  No visual change. No numbness.  She is interested in referral to [facility] in [place].. neurocognitive testing.Marland Kitchen given her past MRI results, family history of dementia.  Poor balance and occ word finding issues continued. See previous OVs Saw Dr. Manuella Ghazi  Neuro for this as well.. has follow up 06/28/22 Reviewed last OV note from 04/28/2022.   Also new onset pain in left knee, left anterior thigh. Pain with bending and walking.  No popping, no crackling. Occ feeling like knee not secure.  No swelling, no redness  No new fall, or injury.   Every few days.. full body pain      Relevant past medical, surgical, family and social history reviewed and updated as indicated. Interim medical history since our last visit reviewed. Allergies and medications reviewed and updated. Outpatient Medications Prior to Visit  Medication Sig Dispense Refill   amLODipine-valsartan (EXFORGE) 10-160 MG tablet TAKE 1 TABLET BY MOUTH EVERY DAY FOR BLOOD PRESSURE 90 tablet 1   aspirin EC 81 MG tablet Take 81 mg by mouth daily. Swallow whole.     lamoTRIgine (LAMICTAL) 200 MG tablet Take 200 mg by mouth  daily.     liothyronine (CYTOMEL) 25 MCG tablet Take 25 mcg by mouth every morning.     Magnesium Citrate 100 MG CAPS Take 200 mg by mouth daily.     metoprolol succinate (TOPROL-XL) 100 MG 24 hr tablet TAKE 1 TABLET BY MOUTH EVERY DAY WITH OR IMMEDIATELY FOLLOWING A MEAL 90 tablet 0   omeprazole (PRILOSEC) 20 MG capsule Take 20 mg by mouth daily.     Vitamin D, Ergocalciferol, (DRISDOL) 1.25 MG (50000 UNIT) CAPS capsule TAKE 1 CAPSULE BY MOUTH EVERY 7 DAYS 12 capsule 0   zolpidem (AMBIEN) 5 MG tablet Take 5 mg by mouth at bedtime.     No facility-administered medications prior to visit.     Per HPI unless specifically indicated in ROS section below Review of Systems  Constitutional:  Negative for fatigue and fever.  HENT:  Negative for congestion.   Eyes:  Negative for pain.  Respiratory:  Negative for cough and shortness of breath.   Cardiovascular:  Negative for chest pain, palpitations and leg swelling.  Gastrointestinal:  Negative for abdominal pain.  Genitourinary:  Negative for dysuria and vaginal bleeding.  Musculoskeletal:  Negative for back pain.  Neurological:  Positive for headaches. Negative for syncope and light-headedness.  Psychiatric/Behavioral:  Positive for confusion. Negative  for dysphoric mood.    Objective:  BP 126/76   Pulse 69   Temp (!) 97 F (36.1 C) (Temporal)   Resp 16   Ht 5' 3.5" (1.613 m)   Wt 183 lb 2 oz (83.1 kg)   LMP  (LMP Unknown)   SpO2 99%   BMI 31.93 kg/m   Wt Readings from Last 3 Encounters:  06/23/22 183 lb 2 oz (83.1 kg)  05/20/22 178 lb (80.7 kg)  02/17/22 172 lb 8 oz (78.2 kg)      Physical Exam Constitutional:      General: She is not in acute distress.    Appearance: Normal appearance. She is well-developed. She is not ill-appearing or toxic-appearing.  HENT:     Head: Normocephalic.     Comments: Small swelling central forehead likely resolving hematoma.  No sensation change no pain    Right Ear: Hearing, tympanic  membrane, ear canal and external ear normal. Tympanic membrane is not erythematous, retracted or bulging.     Left Ear: Hearing, tympanic membrane, ear canal and external ear normal. Tympanic membrane is not erythematous, retracted or bulging.     Nose: No mucosal edema or rhinorrhea.     Right Sinus: No maxillary sinus tenderness or frontal sinus tenderness.     Left Sinus: No maxillary sinus tenderness or frontal sinus tenderness.     Mouth/Throat:     Pharynx: Uvula midline.  Eyes:     General: Lids are normal. Lids are everted, no foreign bodies appreciated.     Conjunctiva/sclera: Conjunctivae normal.     Pupils: Pupils are equal, round, and reactive to light.  Neck:     Thyroid: No thyroid mass or thyromegaly.     Vascular: No carotid bruit.     Trachea: Trachea normal.  Cardiovascular:     Rate and Rhythm: Normal rate and regular rhythm.     Pulses: Normal pulses.     Heart sounds: Normal heart sounds, S1 normal and S2 normal. No murmur heard.    No friction rub. No gallop.  Pulmonary:     Effort: Pulmonary effort is normal. No tachypnea or respiratory distress.     Breath sounds: Normal breath sounds. No decreased breath sounds, wheezing, rhonchi or rales.  Abdominal:     General: Bowel sounds are normal.     Palpations: Abdomen is soft.     Tenderness: There is no abdominal tenderness.  Musculoskeletal:     Cervical back: Normal range of motion and neck supple.     Lumbar back: Normal. No tenderness or bony tenderness. Negative right straight leg raise test and negative left straight leg raise test.     Right hip: Normal range of motion.     Left hip: Normal. Normal range of motion.     Right knee: Normal.     Left knee: No swelling. Normal range of motion. Tenderness present over the medial joint line and MCL.     Instability Tests: Anterior drawer test negative. Posterior drawer test negative. Anterior Lachman test negative. Medial McMurray test negative and lateral  McMurray test negative.  Skin:    General: Skin is warm and dry.     Findings: No rash.  Neurological:     Mental Status: She is alert.  Psychiatric:        Mood and Affect: Mood is not anxious or depressed.        Speech: Speech normal.        Behavior: Behavior  normal. Behavior is cooperative.        Thought Content: Thought content normal.        Judgment: Judgment normal.       Results for orders placed or performed in visit on 05/20/22  CK  Result Value Ref Range   Total CK 35 7 - 177 U/L  Lamotrigine level  Result Value Ref Range   Lamotrigine Lvl 6.6 2.5 - 15.0 mcg/mL     COVID 19 screen:  No recent travel or known exposure to COVID19 The patient denies respiratory symptoms of COVID 19 at this time. The importance of social distancing was discussed today.   Assessment and Plan    Problem List Items Addressed This Visit     Acute pain of left knee    Acute, most consistent with medial joint osteoarthritis versus medial collateral ligament injury.  She will start applying topical diclofenac gel 1-4 times daily as needed for pain.  I will send her for x-ray to evaluate for osteoarthritis.      Aphasia    Chronic, currently followed by neurology.  She has upcoming appointment with Dr. Brigitte Pulse in the next few weeks.  It is difficult to assess whether her symptoms are related to her history of moderately to poorly controlled depression.  She has decided to move forward with neurocognitive testing and I have placed a referral today.      Relevant Orders   Ambulatory referral to Neuropsychology   Body aches    Chronic, feeling achy all over at times.  Seems to happen every few days.  This may have changed the way she is moving and triggered a flare of issues in her left knee.      Forehead trauma - Primary    Patient reassured, healing hematoma central forehead.      Other Visit Diagnoses     Family history of dementia       Relevant Orders   Ambulatory referral  to Neuropsychology        Eliezer Lofts, MD

## 2022-06-23 NOTE — Assessment & Plan Note (Signed)
Chronic, moderate control  per pt slgihtly worse  given now caregiver to  aunt. Seeing psychologist.  On lamotrigine 200 mg daily and liothyronine ( to enhance the lamictal) per past  psychiatrist, Dr. Scot Dock On zolpidem 5 mg p.o. nightly  Saw Dr. Buddy Duty.. did not feel comfortable.. she contacted Dr. Scot Dock to make other recommendations.

## 2022-06-23 NOTE — Assessment & Plan Note (Signed)
Acute, most consistent with medial joint osteoarthritis versus medial collateral ligament injury.  She will start applying topical diclofenac gel 1-4 times daily as needed for pain.  I will send her for x-ray to evaluate for osteoarthritis.

## 2022-06-23 NOTE — Assessment & Plan Note (Signed)
Reevaluate with labs.

## 2022-06-23 NOTE — Assessment & Plan Note (Signed)
Reviewed OV note from Dr. Manuella Ghazi 04/28/22 Lab evaluation performed, follow-up pending in 2 months.

## 2022-06-23 NOTE — Assessment & Plan Note (Signed)
Patient reassured, healing hematoma central forehead.

## 2022-06-23 NOTE — Assessment & Plan Note (Signed)
Chronic, feeling achy all over at times.  Seems to happen every few days.  This may have changed the way she is moving and triggered a flare of issues in her left knee.

## 2022-06-23 NOTE — Patient Instructions (Signed)
I have moved forward with the neurocognitive referral. Use topical diclofenac gel 4 times daily as needed on left knee.  Work on gentle stretching for body pain. Keep appointment as planned with Dr. Brigitte Pulse.

## 2022-06-23 NOTE — Assessment & Plan Note (Signed)
Noted new body aches possible side effect to Lamictal.  Eval with lamotrigine level. Will check CK for muscle breakdown. Possible fibromyalgia.

## 2022-06-24 DIAGNOSIS — F5081 Binge eating disorder: Secondary | ICD-10-CM | POA: Diagnosis not present

## 2022-06-24 DIAGNOSIS — F331 Major depressive disorder, recurrent, moderate: Secondary | ICD-10-CM | POA: Diagnosis not present

## 2022-06-27 ENCOUNTER — Encounter: Payer: Self-pay | Admitting: *Deleted

## 2022-06-27 ENCOUNTER — Encounter: Payer: Self-pay | Admitting: Family Medicine

## 2022-06-28 ENCOUNTER — Telehealth: Payer: Self-pay | Admitting: Family Medicine

## 2022-06-28 DIAGNOSIS — R519 Headache, unspecified: Secondary | ICD-10-CM | POA: Diagnosis not present

## 2022-06-28 DIAGNOSIS — R4189 Other symptoms and signs involving cognitive functions and awareness: Secondary | ICD-10-CM | POA: Diagnosis not present

## 2022-06-28 DIAGNOSIS — D329 Benign neoplasm of meninges, unspecified: Secondary | ICD-10-CM | POA: Diagnosis not present

## 2022-06-28 NOTE — Telephone Encounter (Signed)
Referral faxed again.

## 2022-06-28 NOTE — Telephone Encounter (Signed)
[facility] called stating that the referral that she received on this patient didn't have the reason she needed to be seen,or office notes. She would like this information resent in.

## 2022-07-09 DIAGNOSIS — F331 Major depressive disorder, recurrent, moderate: Secondary | ICD-10-CM | POA: Diagnosis not present

## 2022-07-09 DIAGNOSIS — F5081 Binge eating disorder: Secondary | ICD-10-CM | POA: Diagnosis not present

## 2022-07-22 ENCOUNTER — Ambulatory Visit: Payer: BC Managed Care – PPO | Admitting: Family Medicine

## 2022-07-22 ENCOUNTER — Encounter: Payer: Self-pay | Admitting: Family Medicine

## 2022-07-22 VITALS — BP 120/80 | HR 73 | Temp 97.1°F | Ht 63.5 in | Wt 184.0 lb

## 2022-07-22 DIAGNOSIS — I10 Essential (primary) hypertension: Secondary | ICD-10-CM | POA: Diagnosis not present

## 2022-07-22 DIAGNOSIS — G4733 Obstructive sleep apnea (adult) (pediatric): Secondary | ICD-10-CM

## 2022-07-22 DIAGNOSIS — R519 Headache, unspecified: Secondary | ICD-10-CM | POA: Diagnosis not present

## 2022-07-22 DIAGNOSIS — G3184 Mild cognitive impairment, so stated: Secondary | ICD-10-CM | POA: Insufficient documentation

## 2022-07-22 NOTE — Assessment & Plan Note (Signed)
Chronic, followed now by neuro.  No lab testing positive for early Alzheimer's.  MRI unremarkable except for small incidental meningioma.  Moving forward with neuropsychological testing to tease out whether cognitive impairment is due to pseudodementia from mood.  Of note untreated sleep apnea could impair her cognition.  I have encouraged her to move forward with treating sleep apnea.

## 2022-07-22 NOTE — Assessment & Plan Note (Addendum)
Chronic, untreated I again encouraged her to move forward with treating sleep apnea with oral appliance.  I feel like this could be contributing to not only her headache and blood pressure issues but also her cognitive impairment. I offered a referral to sleep specialist for reconsideration of CPAP, she would like to continue to look into an oral appliance instead.

## 2022-07-22 NOTE — Assessment & Plan Note (Signed)
Chronic, likely multifactorial.  Some component of headache is likely from nerve irritation/injury from bladder hitting forehead as well as possible post concussive headache.  Discussed neuropathic pain medication but given this will likely cause side effects and worsening of symptoms she is already having we decided against this.  She can try ibuprofen instead of Excedrin for acute headache, but I encouraged her not to use this on a daily basis.  She will continue to follow-up with neurology and I appreciate their assistance.

## 2022-07-22 NOTE — Patient Instructions (Addendum)
Get a new BP cuff .Marland Kitchen Recheck BP at home. Work on low salt diet, heart healthy diet, exercise and weight loss.  Move forward on setting up oral appliance for sleep apnea as this could be cause of many of your issues.  Keep appt with testing per neuro.

## 2022-07-22 NOTE — Assessment & Plan Note (Signed)
Chronic, potentially related to morning headache. She states she feels her blood pressures have increased with increased dose of Exforge.  I feel like this is unlikely.  I have encouraged her to work on weight loss since she has gained weight in the last month.  She will work on low-salt diet.  She will obtain a new blood pressure cuff given the when she is measuring with at home it is unreliable.  Of note her blood pressure in the office today is normal.  She does have untreated sleep apnea which could be contributing to blood pressure control.

## 2022-07-22 NOTE — Progress Notes (Signed)
Patient ID: Brandi Terrell, female    DOB: 1965/09/22, 57 y.o.   MRN: [id number]  This visit was conducted in person.  BP 120/80   Pulse 73   Temp (!) 97.1 F (36.2 C) (Temporal)   Ht 5' 3.5" (1.613 m)   Wt 184 lb (83.5 kg)   LMP  (LMP Unknown)   SpO2 98%   BMI 32.08 kg/m    CC:  Chief Complaint  Patient presents with   Headache   Hypertension    Subjective:   HPI: Brandi Terrell is a 57 y.o. female presenting on 07/22/2022 for Headache and Hypertension   Hypertension:  Well controlled in office today. BP Readings from Last 3 Encounters:  07/22/22 120/80  06/23/22 126/76  05/20/22 130/80  Using medication without problems or lightheadedness: none Chest pain with exertion:none Edema:none Short of breath:none Average home BPs: Other issues:    Continued pain in her forehead where she had trauma with a dish falling off a shelf  3 months ago She is currently seeing Dr. Manuella Ghazi for her subjective cognitive impairment, meningioma and chronic daily headache. Reviewed office visit note from June 28, 2022.  Per the note.  Mentions the subjective cognitive impairment was concerning for pseudodementia of depression.  She was referred to neuropsychology testing Lab testing  including beta amyloid, folate, RPR: reassuring.  Incidental finding of meningioma, small and: He recommended repeating scan to monitor with MRI in 2 years.  Chronic daily headache: Felt to be posttraumatic.  At that [place].. BP was high.. increased exforge 10/320 up from 10/160.   Today she reports Surprisingly with increase in BP med dose.. she has noted further increase in BP...  466-599/35-70. ( Previously 130/ 80-85) Using arm cuff, it is older.  Still having AM headache that then recurs around 2 PM each day.  Wt Readings from Last 3 Encounters:  07/22/22 184 lb (83.5 kg)  06/23/22 183 lb 2 oz (83.1 kg)  05/20/22 178 lb (80.7 kg)   Dx with sleep apnea... does not use CPAP.Marland Kitchen tried 3 different  masks, but could not toerate.  Sheplans on moving forward with oral appliance.  Relevant past medical, surgical, family and social history reviewed and updated as indicated. Interim medical history since our last visit reviewed. Allergies and medications reviewed and updated. Outpatient Medications Prior to Visit  Medication Sig Dispense Refill   amLODipine-valsartan (EXFORGE) 10-320 MG tablet Take 1 tablet by mouth daily.     aspirin EC 81 MG tablet Take 81 mg by mouth daily. Swallow whole.     lamoTRIgine (LAMICTAL) 200 MG tablet Take 200 mg by mouth daily.     liothyronine (CYTOMEL) 25 MCG tablet Take 25 mcg by mouth every morning.     Magnesium Citrate 100 MG CAPS Take 200 mg by mouth daily.     metoprolol succinate (TOPROL-XL) 100 MG 24 hr tablet TAKE 1 TABLET BY MOUTH EVERY DAY WITH OR IMMEDIATELY FOLLOWING A MEAL 90 tablet 0   omeprazole (PRILOSEC) 20 MG capsule Take 20 mg by mouth daily.     Vitamin D, Ergocalciferol, (DRISDOL) 1.25 MG (50000 UNIT) CAPS capsule TAKE 1 CAPSULE BY MOUTH EVERY 7 DAYS 12 capsule 0   zolpidem (AMBIEN) 5 MG tablet Take 5 mg by mouth at bedtime.     amLODipine-valsartan (EXFORGE) 10-160 MG tablet TAKE 1 TABLET BY MOUTH EVERY DAY FOR BLOOD PRESSURE 90 tablet 1   No facility-administered medications prior to visit.     Per  HPI unless specifically indicated in ROS section below Review of Systems  Constitutional:  Negative for fatigue and fever.  HENT:  Negative for congestion.   Eyes:  Negative for pain.  Respiratory:  Negative for cough and shortness of breath.   Cardiovascular:  Negative for chest pain, palpitations and leg swelling.  Gastrointestinal:  Negative for abdominal pain.  Genitourinary:  Negative for dysuria and vaginal bleeding.  Musculoskeletal:  Negative for back pain.  Neurological:  Positive for headaches. Negative for syncope and light-headedness.  Psychiatric/Behavioral:  Positive for dysphoric mood.    Objective:  BP 120/80    Pulse 73   Temp (!) 97.1 F (36.2 C) (Temporal)   Ht 5' 3.5" (1.613 m)   Wt 184 lb (83.5 kg)   LMP  (LMP Unknown)   SpO2 98%   BMI 32.08 kg/m   Wt Readings from Last 3 Encounters:  07/22/22 184 lb (83.5 kg)  06/23/22 183 lb 2 oz (83.1 kg)  05/20/22 178 lb (80.7 kg)      Physical Exam Constitutional:      General: She is not in acute distress.    Appearance: Normal appearance. She is well-developed. She is not ill-appearing or toxic-appearing.  HENT:     Head: Normocephalic.     Right Ear: Hearing, tympanic membrane, ear canal and external ear normal. Tympanic membrane is not erythematous, retracted or bulging.     Left Ear: Hearing, tympanic membrane, ear canal and external ear normal. Tympanic membrane is not erythematous, retracted or bulging.     Nose: No mucosal edema or rhinorrhea.     Right Sinus: No maxillary sinus tenderness or frontal sinus tenderness.     Left Sinus: No maxillary sinus tenderness or frontal sinus tenderness.     Mouth/Throat:     Pharynx: Uvula midline.  Eyes:     General: Lids are normal. Lids are everted, no foreign bodies appreciated.     Conjunctiva/sclera: Conjunctivae normal.     Pupils: Pupils are equal, round, and reactive to light.  Neck:     Thyroid: No thyroid mass or thyromegaly.     Vascular: No carotid bruit.     Trachea: Trachea normal.  Cardiovascular:     Rate and Rhythm: Normal rate and regular rhythm.     Pulses: Normal pulses.     Heart sounds: Normal heart sounds, S1 normal and S2 normal. No murmur heard.    No friction rub. No gallop.  Pulmonary:     Effort: Pulmonary effort is normal. No tachypnea or respiratory distress.     Breath sounds: Normal breath sounds. No decreased breath sounds, wheezing, rhonchi or rales.  Abdominal:     General: Bowel sounds are normal.     Palpations: Abdomen is soft.     Tenderness: There is no abdominal tenderness.  Musculoskeletal:     Cervical back: Normal range of motion and neck  supple.  Skin:    General: Skin is warm and dry.     Findings: No rash.  Neurological:     Mental Status: She is alert.  Psychiatric:        Mood and Affect: Mood is not anxious or depressed.        Speech: Speech normal.        Behavior: Behavior normal. Behavior is cooperative.        Thought Content: Thought content normal.        Judgment: Judgment normal.       Results for  orders placed or performed in visit on 06/27/22  HM MAMMOGRAPHY  Result Value Ref Range   HM Mammogram 0-4 Bi-Rad 0-4 Bi-Rad, Self Reported Normal    Assessment and Plan  Chronic daily headache Assessment & Plan: Chronic, likely multifactorial.  Some component of headache is likely from nerve irritation/injury from bladder hitting forehead as well as possible post concussive headache.  Discussed neuropathic pain medication but given this will likely cause side effects and worsening of symptoms she is already having we decided against this.  She can try ibuprofen instead of Excedrin for acute headache, but I encouraged her not to use this on a daily basis.  She will continue to follow-up with neurology and I appreciate their assistance.   Primary hypertension Assessment & Plan: Chronic, potentially related to morning headache. She states she feels her blood pressures have increased with increased dose of Exforge.  I feel like this is unlikely.  I have encouraged her to work on weight loss since she has gained weight in the last month.  She will work on low-salt diet.  She will obtain a new blood pressure cuff given the when she is measuring with at home it is unreliable.  Of note her blood pressure in the office today is normal.  She does have untreated sleep apnea which could be contributing to blood pressure control.   Obstructive sleep apnea Assessment & Plan: Chronic, untreated I again encouraged her to move forward with treating sleep apnea with oral appliance.  I feel like this could be  contributing to not only her headache and blood pressure issues but also her cognitive impairment. I offered a referral to sleep specialist for reconsideration of CPAP, she would like to continue to look into an oral appliance instead.   Mild cognitive impairment Assessment & Plan: Chronic, followed now by neuro.  No lab testing positive for early Alzheimer's.  MRI unremarkable except for small incidental meningioma.  Moving forward with neuropsychological testing to tease out whether cognitive impairment is due to pseudodementia from mood.  Of note untreated sleep apnea could impair her cognition.  I have encouraged her to move forward with treating sleep apnea.     No follow-ups on file.   Eliezer Lofts, MD

## 2022-07-26 DIAGNOSIS — F331 Major depressive disorder, recurrent, moderate: Secondary | ICD-10-CM | POA: Diagnosis not present

## 2022-07-26 DIAGNOSIS — F5081 Binge eating disorder: Secondary | ICD-10-CM | POA: Diagnosis not present

## 2022-07-29 DIAGNOSIS — Z23 Encounter for immunization: Secondary | ICD-10-CM | POA: Diagnosis not present

## 2022-08-09 ENCOUNTER — Other Ambulatory Visit: Payer: Self-pay | Admitting: Family Medicine

## 2022-08-09 DIAGNOSIS — F5081 Binge eating disorder: Secondary | ICD-10-CM | POA: Diagnosis not present

## 2022-08-09 DIAGNOSIS — F331 Major depressive disorder, recurrent, moderate: Secondary | ICD-10-CM | POA: Diagnosis not present

## 2022-08-19 ENCOUNTER — Ambulatory Visit: Payer: BC Managed Care – PPO | Admitting: Family Medicine

## 2022-08-25 ENCOUNTER — Other Ambulatory Visit: Payer: BC Managed Care – PPO

## 2022-08-26 ENCOUNTER — Encounter: Payer: Self-pay | Admitting: Family Medicine

## 2022-08-26 ENCOUNTER — Ambulatory Visit: Payer: BC Managed Care – PPO | Admitting: Family Medicine

## 2022-08-26 VITALS — BP 117/72 | HR 68 | Temp 97.2°F | Ht 63.5 in | Wt 184.1 lb

## 2022-08-26 DIAGNOSIS — I10 Essential (primary) hypertension: Secondary | ICD-10-CM

## 2022-08-26 DIAGNOSIS — R7989 Other specified abnormal findings of blood chemistry: Secondary | ICD-10-CM

## 2022-08-26 LAB — BASIC METABOLIC PANEL
BUN: 15 mg/dL (ref 6–23)
CO2: 29 mEq/L (ref 19–32)
Calcium: 10.8 mg/dL — ABNORMAL HIGH (ref 8.4–10.5)
Chloride: 103 mEq/L (ref 96–112)
Creatinine, Ser: 0.73 mg/dL (ref 0.40–1.20)
GFR: 90.29 mL/min (ref >60.00)
Glucose, Bld: 98 mg/dL (ref 70–99)
Potassium: 4.3 mEq/L (ref 3.5–5.1)
Sodium: 139 mEq/L (ref 135–145)

## 2022-08-26 LAB — TSH: TSH: 0.85 u[IU]/mL (ref 0.35–5.50)

## 2022-08-26 LAB — T3, FREE: T3, Free: 5.7 pg/mL — ABNORMAL HIGH (ref 2.3–4.2)

## 2022-08-26 LAB — T4, FREE: Free T4: 0.42 ng/dL — ABNORMAL LOW (ref 0.60–1.60)

## 2022-08-26 NOTE — Progress Notes (Signed)
Patient ID: Brandi Terrell, female    DOB: 08-Jul-1965, 57 y.o.   MRN: [id number]  This visit was conducted in person.  BP 117/72 Comment: Patient [employer] BP monitor  Pulse 68   Temp (!) 97.2 F (36.2 C) (Temporal)   Ht 5' 3.5" (1.613 m)   Wt 184 lb 2 oz (83.5 kg)   LMP  (LMP Unknown)   SpO2 96%   BMI 32.10 kg/m    CC:  Chief Complaint  Patient presents with   Abnormal Thyroid Labs   Hypertension    Subjective:   HPI: Brandi Terrell is a 57 y.o. female presenting on 08/26/2022 for Abnormal Thyroid Labs and Hypertension  Hypertension:  Well controlled now on increased exforge 10/320 up from 10/160.   130/86 also on metoprolol xl 100  Cuff is accurate with ours today BP Readings from Last 3 Encounters:  08/26/22 117/72  07/22/22 120/80  06/23/22 126/76  Using medication without problems or lightheadedness:  none, continued headaches Chest pain with exertion: none Edema: none Short of breath: none Average home BPs:  140-150/90-100... taking it at different times of day Other issues:  Due for repeat thyroid lab testing  on cytomel 25 mcg daily  (using this only  to boost the lamictal)     Relevant past medical, surgical, family and social history reviewed and updated as indicated. Interim medical history since our last visit reviewed. Allergies and medications reviewed and updated. Outpatient Medications Prior to Visit  Medication Sig Dispense Refill   amLODipine-valsartan (EXFORGE) 10-320 MG tablet Take 1 tablet by mouth daily.     lamoTRIgine (LAMICTAL) 200 MG tablet Take 200 mg by mouth daily.     liothyronine (CYTOMEL) 25 MCG tablet Take 25 mcg by mouth every morning.     Magnesium Citrate 100 MG CAPS Take 200 mg by mouth daily.     metoprolol succinate (TOPROL-XL) 100 MG 24 hr tablet TAKE 1 TABLET BY MOUTH EVERY DAY WITH OR IMMEDIATELY FOLLOWING A MEAL 90 tablet 3   omeprazole (PRILOSEC) 20 MG capsule Take 20 mg by mouth daily.     zolpidem (AMBIEN) 5 MG tablet  Take 5 mg by mouth at bedtime.     aspirin EC 81 MG tablet Take 81 mg by mouth daily. Swallow whole.     Vitamin D, Ergocalciferol, (DRISDOL) 1.25 MG (50000 UNIT) CAPS capsule TAKE 1 CAPSULE BY MOUTH EVERY 7 DAYS 12 capsule 0   No facility-administered medications prior to visit.     Per HPI unless specifically indicated in ROS section below Review of Systems  Constitutional:  Negative for fatigue and fever.  HENT:  Negative for congestion.   Eyes:  Negative for pain.  Respiratory:  Negative for cough and shortness of breath.   Cardiovascular:  Negative for chest pain, palpitations and leg swelling.  Gastrointestinal:  Negative for abdominal pain.  Genitourinary:  Negative for dysuria and vaginal bleeding.  Musculoskeletal:  Negative for back pain.  Neurological:  Negative for syncope, light-headedness and headaches.  Psychiatric/Behavioral:  Negative for dysphoric mood.    Objective:  BP 117/72 Comment: Patient [employer] BP monitor  Pulse 68   Temp (!) 97.2 F (36.2 C) (Temporal)   Ht 5' 3.5" (1.613 m)   Wt 184 lb 2 oz (83.5 kg)   LMP  (LMP Unknown)   SpO2 96%   BMI 32.10 kg/m   Wt Readings from Last 3 Encounters:  08/26/22 184 lb 2 oz (83.5 kg)  07/22/22  184 lb (83.5 kg)  06/23/22 183 lb 2 oz (83.1 kg)      Physical Exam Constitutional:      General: She is not in acute distress.    Appearance: Normal appearance. She is well-developed. She is not ill-appearing or toxic-appearing.  HENT:     Head: Normocephalic.     Right Ear: Hearing, tympanic membrane, ear canal and external ear normal. Tympanic membrane is not erythematous, retracted or bulging.     Left Ear: Hearing, tympanic membrane, ear canal and external ear normal. Tympanic membrane is not erythematous, retracted or bulging.     Nose: No mucosal edema or rhinorrhea.     Right Sinus: No maxillary sinus tenderness or frontal sinus tenderness.     Left Sinus: No maxillary sinus tenderness or frontal sinus tenderness.      Mouth/Throat:     Pharynx: Uvula midline.  Eyes:     General: Lids are normal. Lids are everted, no foreign bodies appreciated.     Conjunctiva/sclera: Conjunctivae normal.     Pupils: Pupils are equal, round, and reactive to light.  Neck:     Thyroid: No thyroid mass or thyromegaly.     Vascular: No carotid bruit.     Trachea: Trachea normal.  Cardiovascular:     Rate and Rhythm: Normal rate and regular rhythm.     Pulses: Normal pulses.     Heart sounds: Normal heart sounds, S1 normal and S2 normal. No murmur heard.    No friction rub. No gallop.  Pulmonary:     Effort: Pulmonary effort is normal. No tachypnea or respiratory distress.     Breath sounds: Normal breath sounds. No decreased breath sounds, wheezing, rhonchi or rales.  Abdominal:     General: Bowel sounds are normal.     Palpations: Abdomen is soft.     Tenderness: There is no abdominal tenderness.  Musculoskeletal:     Cervical back: Normal range of motion and neck supple.  Skin:    General: Skin is warm and dry.     Findings: No rash.  Neurological:     Mental Status: She is alert.  Psychiatric:        Mood and Affect: Mood is not anxious or depressed.        Speech: Speech normal.        Behavior: Behavior normal. Behavior is cooperative.        Thought Content: Thought content normal.        Judgment: Judgment normal.       Results for orders placed or performed in visit on 06/27/22  HM MAMMOGRAPHY  Result Value Ref Range   HM Mammogram 0-4 Bi-Rad 0-4 Bi-Rad, Self Reported Normal    Assessment and Plan  Abnormal thyroid blood test -     T3, free -     T4, free -     TSH    No follow-ups on file.   Eliezer Lofts, MD

## 2022-08-26 NOTE — Assessment & Plan Note (Signed)
Chronic, she feels strongly that blood pressure is worsened since increasing her Exforge. Blood pressure elevation is likely multifactorial from possible increased stress, recent thyroid abnormality (Cytomel may be increasing thyroid levels above goal), as well as untreated sleep apnea.  I will check her kidney function today to make sure that the increase in Exforge has not affected her kidneys and resulted in a blood pressure issue.   We will reevaluate thyroid. If thyroid in normal range, she will return to a trial of a lower dose of Exforge and follow her blood pressure over the next 2 to 3 weeks.  If it continues to be above goal we will consider adding a different medication to get better blood pressure control.

## 2022-08-26 NOTE — Assessment & Plan Note (Signed)
She is on Cytomel for boosting her Lamictal function.  This seems to be elevating her T3 and lowering her T4 which may be affecting her blood pressure.  We will reevaluate with TSH, free T3 and free T4 today.  Continue for now on Cytomel 25 mg p.o. daily, but she is willing to stop this if it is causing disruption of her thyroid function.

## 2022-08-26 NOTE — Patient Instructions (Signed)
Please stop at the lab to have labs drawn.  If thyroid testing back in normal range.  Continue cytomel 25 mcg daily and plan trial of returning to Exforge 10/160 mg daily... follow BPs.. call with results in 2 weeks. Goal < 140/90.

## 2022-08-30 DIAGNOSIS — H40003 Preglaucoma, unspecified, bilateral: Secondary | ICD-10-CM | POA: Diagnosis not present

## 2022-08-30 DIAGNOSIS — H40059 Ocular hypertension, unspecified eye: Secondary | ICD-10-CM | POA: Diagnosis not present

## 2022-08-30 DIAGNOSIS — Z0100 Encounter for examination of eyes and vision without abnormal findings: Secondary | ICD-10-CM | POA: Diagnosis not present

## 2022-08-31 DIAGNOSIS — F3181 Bipolar II disorder: Secondary | ICD-10-CM | POA: Diagnosis not present

## 2022-08-31 DIAGNOSIS — F5081 Binge eating disorder: Secondary | ICD-10-CM | POA: Diagnosis not present

## 2022-08-31 DIAGNOSIS — F331 Major depressive disorder, recurrent, moderate: Secondary | ICD-10-CM | POA: Diagnosis not present

## 2022-08-31 DIAGNOSIS — F5101 Primary insomnia: Secondary | ICD-10-CM | POA: Diagnosis not present

## 2022-09-05 DIAGNOSIS — Z8616 Personal history of COVID-19: Secondary | ICD-10-CM | POA: Diagnosis not present

## 2022-09-05 DIAGNOSIS — R4189 Other symptoms and signs involving cognitive functions and awareness: Secondary | ICD-10-CM | POA: Diagnosis not present

## 2022-09-05 DIAGNOSIS — R413 Other amnesia: Secondary | ICD-10-CM | POA: Diagnosis not present

## 2022-09-19 ENCOUNTER — Telehealth: Payer: Self-pay | Admitting: *Deleted

## 2022-09-19 DIAGNOSIS — R7989 Other specified abnormal findings of blood chemistry: Secondary | ICD-10-CM

## 2022-09-19 NOTE — Telephone Encounter (Signed)
-----   Message from Ellamae Sia sent at 09/19/2022  9:57 AM EDT ----- Regarding: Lab orders for Monday, 4.15.24 Lab orders for thyroid, thanks

## 2022-09-26 DIAGNOSIS — F5081 Binge eating disorder: Secondary | ICD-10-CM | POA: Diagnosis not present

## 2022-09-26 DIAGNOSIS — F331 Major depressive disorder, recurrent, moderate: Secondary | ICD-10-CM | POA: Diagnosis not present

## 2022-10-03 ENCOUNTER — Other Ambulatory Visit (INDEPENDENT_AMBULATORY_CARE_PROVIDER_SITE_OTHER): Payer: BC Managed Care – PPO

## 2022-10-03 DIAGNOSIS — R7989 Other specified abnormal findings of blood chemistry: Secondary | ICD-10-CM | POA: Diagnosis not present

## 2022-10-03 LAB — T4, FREE: Free T4: 1.16 ng/dL (ref 0.60–1.60)

## 2022-10-03 LAB — T3, FREE: T3, Free: 3.9 pg/mL (ref 2.3–4.2)

## 2022-10-03 LAB — TSH: TSH: 2.83 u[IU]/mL (ref 0.35–5.50)

## 2022-10-14 DIAGNOSIS — Z8616 Personal history of COVID-19: Secondary | ICD-10-CM | POA: Diagnosis not present

## 2022-10-14 DIAGNOSIS — R4189 Other symptoms and signs involving cognitive functions and awareness: Secondary | ICD-10-CM | POA: Diagnosis not present

## 2022-10-14 DIAGNOSIS — R413 Other amnesia: Secondary | ICD-10-CM | POA: Diagnosis not present

## 2022-10-25 ENCOUNTER — Ambulatory Visit: Payer: BC Managed Care – PPO | Admitting: Family Medicine

## 2022-10-25 VITALS — BP 128/68 | HR 64 | Temp 98.9°F | Ht 63.5 in | Wt 190.8 lb

## 2022-10-25 DIAGNOSIS — S46811A Strain of other muscles, fascia and tendons at shoulder and upper arm level, right arm, initial encounter: Secondary | ICD-10-CM | POA: Insufficient documentation

## 2022-10-25 MED ORDER — DICLOFENAC SODIUM 75 MG PO TBEC
75.0000 mg | DELAYED_RELEASE_TABLET | Freq: Two times a day (BID) | ORAL | 0 refills | Status: DC
Start: 2022-10-25 — End: 2022-11-28

## 2022-10-25 MED ORDER — CYCLOBENZAPRINE HCL 10 MG PO TABS: 5.0000 mg | ORAL_TABLET | Freq: Every evening | ORAL | 0 refills | Status: DC | PRN

## 2022-10-25 NOTE — Progress Notes (Signed)
Patient ID: Brandi Terrell, female    DOB: 1966-04-01, 57 y.o.   MRN: [id number]  This visit was conducted in person.  BP 128/68   Pulse 64   Temp 98.9 F (37.2 C) (Oral)   Ht 5' 3.5" (1.613 m)   Wt 190 lb 12.8 oz (86.5 kg)   LMP  (LMP Unknown)   SpO2 98%   BMI 33.27 kg/m    CC:  Chief Complaint  Patient presents with   Pain    Pain in neck that radiates down arm. Pt reports began 5-7 days ago. Rates 8/10, constant, taking ibuprofen that has helped some.  Has history of frozen shoulder 10 years ago.     Subjective:   HPI: Brandi Terrell is a 57 y.o. female presenting on 10/25/2022 for Pain (Pain in neck that radiates down arm. Pt reports began 5-7 days ago. Rates 8/10, constant, taking ibuprofen that has helped some.  Has history of frozen shoulder 10 years ago. )    Acute right neck and right shoulder pain x 5-7 days.  No fall, no change in activity. 8/10 on pain scale.  Constant pain, starting under shoulder blade into right neck.  Radiates down right upper arm.  No numbness.  No weakness. No change with motion of shoulder... some pain and pulling with neck movement  Ibuprofen 600 mg  qhs and in AM has helped some.       Relevant past medical, surgical, family and social history reviewed and updated as indicated. Interim medical history since our last visit reviewed. Allergies and medications reviewed and updated. Outpatient Medications Prior to Visit  Medication Sig Dispense Refill   amLODipine-valsartan (EXFORGE) 10-320 MG tablet Take 1 tablet by mouth daily.     lamoTRIgine (LAMICTAL) 200 MG tablet Take 200 mg by mouth daily.     Magnesium Citrate 100 MG CAPS Take 200 mg by mouth daily.     metoprolol succinate (TOPROL-XL) 100 MG 24 hr tablet TAKE 1 TABLET BY MOUTH EVERY DAY WITH OR IMMEDIATELY FOLLOWING A MEAL 90 tablet 3   omeprazole (PRILOSEC) 20 MG capsule Take 20 mg by mouth daily.     zolpidem (AMBIEN) 5 MG tablet Take 5 mg by mouth at bedtime.      liothyronine (CYTOMEL) 25 MCG tablet Take 25 mcg by mouth every morning.     No facility-administered medications prior to visit.     Per HPI unless specifically indicated in ROS section below Review of Systems  Constitutional:  Negative for fatigue and fever.  HENT:  Negative for congestion.   Eyes:  Negative for pain.  Respiratory:  Negative for cough and shortness of breath.   Cardiovascular:  Negative for chest pain, palpitations and leg swelling.  Gastrointestinal:  Negative for abdominal pain.  Genitourinary:  Negative for dysuria and vaginal bleeding.  Musculoskeletal:  Negative for back pain.  Neurological:  Negative for syncope, light-headedness and headaches.  Psychiatric/Behavioral:  Negative for dysphoric mood.    Objective:  BP 128/68   Pulse 64   Temp 98.9 F (37.2 C) (Oral)   Ht 5' 3.5" (1.613 m)   Wt 190 lb 12.8 oz (86.5 kg)   LMP  (LMP Unknown)   SpO2 98%   BMI 33.27 kg/m   Wt Readings from Last 3 Encounters:  10/25/22 190 lb 12.8 oz (86.5 kg)  08/26/22 184 lb 2 oz (83.5 kg)  07/22/22 184 lb (83.5 kg)      Physical Exam Constitutional:  General: She is not in acute distress.    Appearance: Normal appearance. She is well-developed. She is not ill-appearing or toxic-appearing.  HENT:     Head: Normocephalic.     Right Ear: Hearing, tympanic membrane, ear canal and external ear normal. Tympanic membrane is not erythematous, retracted or bulging.     Left Ear: Hearing, tympanic membrane, ear canal and external ear normal. Tympanic membrane is not erythematous, retracted or bulging.     Nose: No mucosal edema or rhinorrhea.     Right Sinus: No maxillary sinus tenderness or frontal sinus tenderness.     Left Sinus: No maxillary sinus tenderness or frontal sinus tenderness.     Mouth/Throat:     Pharynx: Uvula midline.  Eyes:     General: Lids are normal. Lids are everted, no foreign bodies appreciated.     Conjunctiva/sclera: Conjunctivae normal.      Pupils: Pupils are equal, round, and reactive to light.  Neck:     Thyroid: No thyroid mass or thyromegaly.     Vascular: No carotid bruit.     Trachea: Trachea normal.  Cardiovascular:     Rate and Rhythm: Normal rate and regular rhythm.     Pulses: Normal pulses.     Heart sounds: Normal heart sounds, S1 normal and S2 normal. No murmur heard.    No friction rub. No gallop.  Pulmonary:     Effort: Pulmonary effort is normal. No tachypnea or respiratory distress.     Breath sounds: Normal breath sounds. No decreased breath sounds, wheezing, rhonchi or rales.  Abdominal:     General: Bowel sounds are normal.     Palpations: Abdomen is soft.     Tenderness: There is no abdominal tenderness.  Musculoskeletal:     Right shoulder: Normal. No tenderness or bony tenderness. Normal range of motion.     Right upper arm: Normal. No tenderness or bony tenderness.     Cervical back: Neck supple. Spasms and tenderness present. No deformity, signs of trauma or crepitus. Decreased range of motion.     Comments: Negative Spurling test  Skin:    General: Skin is warm and dry.     Findings: No rash.  Neurological:     Mental Status: She is alert.     Cranial Nerves: Cranial nerves 2-12 are intact.     Sensory: Sensation is intact.     Motor: Motor function is intact.     Coordination: Coordination is intact.  Psychiatric:        Mood and Affect: Mood is not anxious or depressed.        Speech: Speech normal.        Behavior: Behavior normal. Behavior is cooperative.        Thought Content: Thought content normal.        Judgment: Judgment normal.       Results for orders placed or performed in visit on 10/03/22  T3, Free  Result Value Ref Range   T3, Free 3.9 2.3 - 4.2 pg/mL  T4, free  Result Value Ref Range   Free T4 1.16 0.60 - 1.60 ng/dL  TSH  Result Value Ref Range   TSH 2.83 0.35 - 5.50 uIU/mL    Assessment and Plan  Trapezius strain, right, initial encounter Assessment &  Plan: Acute Most likely muscular strain of trapezius muscle given distribution.  She does have some upper arm pain that suggest possible cervical radiculopathy but Spurling test was negative. Will start  with upper back stretching, home physical therapy, heat and massage.  She will stop ibuprofen and use diclofenac 75 mg twice daily for 2 weeks, muscle relaxant at night.  She will return in 2 to 4 weeks if not improving as expected for possible x-ray and formal physical therapy. Return and ER precautions provided.   Other orders -     Diclofenac Sodium; Take 1 tablet (75 mg total) by mouth 2 (two) times daily.  Dispense: 30 tablet; Refill: 0 -     Cyclobenzaprine HCl; Take 0.5-1 tablets (5-10 mg total) by mouth at bedtime as needed for muscle spasms.  Dispense: 15 tablet; Refill: 0    No follow-ups on file.   Kerby Nora, MD

## 2022-10-25 NOTE — Patient Instructions (Addendum)
Start diclofenac twice daily in place of ibuprofen  Can use cyclobenzaprine 5-10 mg at bedtime as needed.  Start home PT, heat and massage on neck.  Follow up if not improving as expected in 2-4 weeks.

## 2022-10-25 NOTE — Assessment & Plan Note (Signed)
Acute Most likely muscular strain of trapezius muscle given distribution.  She does have some upper arm pain that suggest possible cervical radiculopathy but Spurling test was negative. Will start with upper back stretching, home physical therapy, heat and massage.  She will stop ibuprofen and use diclofenac 75 mg twice daily for 2 weeks, muscle relaxant at night.  She will return in 2 to 4 weeks if not improving as expected for possible x-ray and formal physical therapy. Return and ER precautions provided.

## 2022-11-03 DIAGNOSIS — R4189 Other symptoms and signs involving cognitive functions and awareness: Secondary | ICD-10-CM | POA: Diagnosis not present

## 2022-11-03 DIAGNOSIS — R413 Other amnesia: Secondary | ICD-10-CM | POA: Diagnosis not present

## 2022-11-03 DIAGNOSIS — Z8616 Personal history of COVID-19: Secondary | ICD-10-CM | POA: Diagnosis not present

## 2022-11-08 DIAGNOSIS — F331 Major depressive disorder, recurrent, moderate: Secondary | ICD-10-CM | POA: Diagnosis not present

## 2022-11-08 DIAGNOSIS — F5081 Binge eating disorder: Secondary | ICD-10-CM | POA: Diagnosis not present

## 2022-11-28 ENCOUNTER — Other Ambulatory Visit: Payer: Self-pay | Admitting: Family Medicine

## 2022-11-28 DIAGNOSIS — F5101 Primary insomnia: Secondary | ICD-10-CM | POA: Diagnosis not present

## 2022-11-28 DIAGNOSIS — F3181 Bipolar II disorder: Secondary | ICD-10-CM | POA: Diagnosis not present

## 2022-12-14 ENCOUNTER — Ambulatory Visit: Payer: BC Managed Care – PPO | Admitting: Family Medicine

## 2022-12-20 DIAGNOSIS — F5081 Binge eating disorder: Secondary | ICD-10-CM | POA: Diagnosis not present

## 2022-12-20 DIAGNOSIS — F331 Major depressive disorder, recurrent, moderate: Secondary | ICD-10-CM | POA: Diagnosis not present

## 2022-12-27 ENCOUNTER — Ambulatory Visit: Payer: BC Managed Care – PPO | Admitting: Family Medicine

## 2023-01-03 ENCOUNTER — Encounter: Payer: Self-pay | Admitting: Family Medicine

## 2023-01-03 NOTE — Telephone Encounter (Signed)
Please advise 

## 2023-01-19 DIAGNOSIS — F331 Major depressive disorder, recurrent, moderate: Secondary | ICD-10-CM | POA: Diagnosis not present

## 2023-01-19 DIAGNOSIS — F5081 Binge eating disorder: Secondary | ICD-10-CM | POA: Diagnosis not present

## 2023-01-24 DIAGNOSIS — D2261 Melanocytic nevi of right upper limb, including shoulder: Secondary | ICD-10-CM | POA: Diagnosis not present

## 2023-01-24 DIAGNOSIS — Z08 Encounter for follow-up examination after completed treatment for malignant neoplasm: Secondary | ICD-10-CM | POA: Diagnosis not present

## 2023-01-24 DIAGNOSIS — D485 Neoplasm of uncertain behavior of skin: Secondary | ICD-10-CM | POA: Diagnosis not present

## 2023-01-24 DIAGNOSIS — Z85828 Personal history of other malignant neoplasm of skin: Secondary | ICD-10-CM | POA: Diagnosis not present

## 2023-01-24 DIAGNOSIS — D0359 Melanoma in situ of other part of trunk: Secondary | ICD-10-CM | POA: Diagnosis not present

## 2023-01-24 DIAGNOSIS — D225 Melanocytic nevi of trunk: Secondary | ICD-10-CM | POA: Diagnosis not present

## 2023-02-02 ENCOUNTER — Encounter: Payer: Self-pay | Admitting: Family Medicine

## 2023-02-02 DIAGNOSIS — C439 Malignant melanoma of skin, unspecified: Secondary | ICD-10-CM | POA: Insufficient documentation

## 2023-02-03 DIAGNOSIS — D0359 Melanoma in situ of other part of trunk: Secondary | ICD-10-CM | POA: Diagnosis not present

## 2023-02-14 DIAGNOSIS — L905 Scar conditions and fibrosis of skin: Secondary | ICD-10-CM | POA: Diagnosis not present

## 2023-02-14 DIAGNOSIS — F331 Major depressive disorder, recurrent, moderate: Secondary | ICD-10-CM | POA: Diagnosis not present

## 2023-02-14 DIAGNOSIS — M67441 Ganglion, right hand: Secondary | ICD-10-CM | POA: Diagnosis not present

## 2023-02-14 DIAGNOSIS — F5081 Binge eating disorder: Secondary | ICD-10-CM | POA: Diagnosis not present

## 2023-03-02 DIAGNOSIS — H40003 Preglaucoma, unspecified, bilateral: Secondary | ICD-10-CM | POA: Diagnosis not present

## 2023-03-09 ENCOUNTER — Encounter: Payer: Self-pay | Admitting: Family Medicine

## 2023-03-09 ENCOUNTER — Ambulatory Visit: Payer: BC Managed Care – PPO | Admitting: Family Medicine

## 2023-03-09 VITALS — BP 122/60 | HR 67 | Temp 98.1°F | Ht 63.5 in | Wt 191.2 lb

## 2023-03-09 DIAGNOSIS — R1013 Epigastric pain: Secondary | ICD-10-CM | POA: Insufficient documentation

## 2023-03-09 LAB — CBC WITH DIFFERENTIAL/PLATELET
Basophils Absolute: 0 10*3/uL (ref 0.0–0.1)
Basophils Relative: 0.5 % (ref 0.0–3.0)
Eosinophils Absolute: 0.2 10*3/uL (ref 0.0–0.7)
Eosinophils Relative: 2.7 % (ref 0.0–5.0)
HCT: 37.6 % (ref 36.0–46.0)
Hemoglobin: 12.3 g/dL (ref 12.0–15.0)
Lymphocytes Relative: 51.6 % — ABNORMAL HIGH (ref 12.0–46.0)
Lymphs Abs: 3 10*3/uL (ref 0.7–4.0)
MCHC: 32.7 g/dL (ref 30.0–36.0)
MCV: 84.8 fl (ref 78.0–100.0)
Monocytes Absolute: 0.4 10*3/uL (ref 0.1–1.0)
Monocytes Relative: 7.5 % (ref 3.0–12.0)
Neutro Abs: 2.2 10*3/uL (ref 1.4–7.7)
Neutrophils Relative %: 37.7 % — ABNORMAL LOW (ref 43.0–77.0)
Platelets: 334 10*3/uL (ref 150.0–400.0)
RBC: 4.43 Mil/uL (ref 3.87–5.11)
RDW: 14.6 % (ref 11.5–15.5)
WBC: 5.8 10*3/uL (ref 4.0–10.5)

## 2023-03-09 LAB — COMPREHENSIVE METABOLIC PANEL
ALT: 16 U/L (ref 0–35)
AST: 17 U/L (ref 0–37)
Albumin: 4.4 g/dL (ref 3.5–5.2)
Alkaline Phosphatase: 141 U/L — ABNORMAL HIGH (ref 39–117)
BUN: 21 mg/dL (ref 6–23)
CO2: 28 mEq/L (ref 19–32)
Calcium: 10.2 mg/dL (ref 8.4–10.5)
Chloride: 106 mEq/L (ref 96–112)
Creatinine, Ser: 0.77 mg/dL (ref 0.40–1.20)
GFR: 84.37 mL/min (ref >60.00)
Glucose, Bld: 102 mg/dL — ABNORMAL HIGH (ref 70–99)
Potassium: 4.3 mEq/L (ref 3.5–5.1)
Sodium: 141 mEq/L (ref 135–145)
Total Bilirubin: 0.6 mg/dL (ref 0.2–1.2)
Total Protein: 7.2 g/dL (ref 6.0–8.3)

## 2023-03-09 LAB — LIPASE: Lipase: 213 U/L — ABNORMAL HIGH (ref 11.0–59.0)

## 2023-03-09 NOTE — Assessment & Plan Note (Signed)
Acute, differential includes gastritis, peptic ulcer or duodenal ulcer, pancreatitis.  Patient is status post cholecystectomy. Will begin evaluation with labs to check CBC, lipase and complete metabolic panel.  Will check H. pylori breath test.  Recommend avoiding acidic foods.  Increase omeprazole to 40 mg p.o. daily.  Return and ER precautions provided

## 2023-03-09 NOTE — Progress Notes (Signed)
Patient ID: Brandi Terrell, female    DOB: 1965-09-16, 57 y.o.   MRN: [id number]  This visit was conducted in person.  BP 122/60   Pulse 67   Temp 98.1 F (36.7 C) (Temporal)   Ht 5' 3.5" (1.613 m)   Wt 191 lb 3.2 oz (86.7 kg)   LMP  (LMP Unknown)   SpO2 97%   BMI 33.34 kg/m    CC:  Chief Complaint  Patient presents with   lower sternum and stomach pain    Symptoms started 5 weeks ago. Pt complains of painful achy pain from sternum to her whole stomach. Pt says it feels like an ulcer. Pt states pain happens in the evenings and in the morning.    Bloated    Pt states that eating helps the pain go away sometimes. Pt complains of feeling flushed when "this is all going on"     Subjective:   HPI: Brandi Terrell is a 57 y.o. female presenting on 03/09/2023 for lower sternum and stomach pain (Symptoms started 5 weeks ago. Pt complains of painful achy pain from sternum to her whole stomach. Pt says it feels like an ulcer. Pt states pain happens in the evenings and in the morning. ) and Bloated (Pt states that eating helps the pain go away sometimes. Pt complains of feeling flushed when "this is all going on" )  5 weeks of pain generalized over her abdomen primarily in epigastrium. Pain is intermittent, more in Am when awaking.  Feeling bloated and occ nausea.  No Vomiting, no diarrhea, constipation.  Pain improves with eating.   No new medications other than B complex.   ON prilosec 20 mg daily for reflux.     Not taking diclofenac, but using Excedrin for HA.   S/P cholecystectomy.   Relevant past medical, surgical, family and social history reviewed and updated as indicated. Interim medical history since our last visit reviewed. Allergies and medications reviewed and updated. Outpatient Medications Prior to Visit  Medication Sig Dispense Refill   amLODipine-valsartan (EXFORGE) 10-320 MG tablet Take 1 tablet by mouth daily.     B-COMPLEX, FOLIC ACID, PO Take by mouth.      lamoTRIgine (LAMICTAL) 200 MG tablet Take 200 mg by mouth daily.     Magnesium Citrate 100 MG CAPS Take 200 mg by mouth daily.     metoprolol succinate (TOPROL-XL) 100 MG 24 hr tablet TAKE 1 TABLET BY MOUTH EVERY DAY WITH OR IMMEDIATELY FOLLOWING A MEAL 90 tablet 3   omeprazole (PRILOSEC) 20 MG capsule Take 20 mg by mouth daily.     zolpidem (AMBIEN) 5 MG tablet Take 5 mg by mouth at bedtime.     cyclobenzaprine (FLEXERIL) 10 MG tablet Take 0.5-1 tablets (5-10 mg total) by mouth at bedtime as needed for muscle spasms. (Patient not taking: Reported on 03/09/2023) 15 tablet 0   diclofenac (VOLTAREN) 75 MG EC tablet TAKE 1 TABLET(75 MG) BY MOUTH TWICE DAILY (Patient not taking: Reported on 03/09/2023) 30 tablet 0   No facility-administered medications prior to visit.     Per HPI unless specifically indicated in ROS section below Review of Systems  Constitutional:  Negative for fatigue and fever.  HENT:  Negative for congestion.   Eyes:  Negative for pain.  Respiratory:  Negative for cough and shortness of breath.   Cardiovascular:  Negative for chest pain, palpitations and leg swelling.  Gastrointestinal:  Positive for abdominal distention and abdominal pain.  Genitourinary:  Negative for dysuria and vaginal bleeding.  Musculoskeletal:  Negative for back pain.  Neurological:  Negative for syncope, light-headedness and headaches.  Psychiatric/Behavioral:  Negative for dysphoric mood.    Objective:  BP 122/60   Pulse 67   Temp 98.1 F (36.7 C) (Temporal)   Ht 5' 3.5" (1.613 m)   Wt 191 lb 3.2 oz (86.7 kg)   LMP  (LMP Unknown)   SpO2 97%   BMI 33.34 kg/m   Wt Readings from Last 3 Encounters:  03/09/23 191 lb 3.2 oz (86.7 kg)  10/25/22 190 lb 12.8 oz (86.5 kg)  08/26/22 184 lb 2 oz (83.5 kg)      Physical Exam Constitutional:      General: She is not in acute distress.    Appearance: Normal appearance. She is well-developed. She is not ill-appearing or toxic-appearing.  HENT:      Head: Normocephalic.     Right Ear: Hearing, tympanic membrane, ear canal and external ear normal. Tympanic membrane is not erythematous, retracted or bulging.     Left Ear: Hearing, tympanic membrane, ear canal and external ear normal. Tympanic membrane is not erythematous, retracted or bulging.     Nose: No mucosal edema or rhinorrhea.     Right Sinus: No maxillary sinus tenderness or frontal sinus tenderness.     Left Sinus: No maxillary sinus tenderness or frontal sinus tenderness.     Mouth/Throat:     Mouth: Oropharynx is clear and moist and mucous membranes are normal.     Pharynx: Uvula midline.  Eyes:     General: Lids are normal. Lids are everted, no foreign bodies appreciated.     Extraocular Movements: EOM normal.     Conjunctiva/sclera: Conjunctivae normal.     Pupils: Pupils are equal, round, and reactive to light.  Neck:     Thyroid: No thyroid mass or thyromegaly.     Vascular: No carotid bruit.     Trachea: Trachea normal.  Cardiovascular:     Rate and Rhythm: Normal rate and regular rhythm.     Pulses: Normal pulses.     Heart sounds: Normal heart sounds, S1 normal and S2 normal. No murmur heard.    No friction rub. No gallop.  Pulmonary:     Effort: Pulmonary effort is normal. No tachypnea or respiratory distress.     Breath sounds: Normal breath sounds. No decreased breath sounds, wheezing, rhonchi or rales.  Abdominal:     General: Bowel sounds are normal.     Palpations: Abdomen is soft.     Tenderness: There is abdominal tenderness in the right upper quadrant and epigastric area.  Musculoskeletal:     Cervical back: Normal range of motion and neck supple.  Skin:    General: Skin is warm, dry and intact.     Findings: No rash.  Neurological:     Mental Status: She is alert.  Psychiatric:        Mood and Affect: Mood is not anxious or depressed.        Speech: Speech normal.        Behavior: Behavior normal. Behavior is cooperative.        Thought  Content: Thought content normal.        Cognition and Memory: Cognition and memory normal.        Judgment: Judgment normal.       Results for orders placed or performed in visit on 10/03/22  T3, Free  Result Value Ref Range  T3, Free 3.9 2.3 - 4.2 pg/mL  T4, free  Result Value Ref Range   Free T4 1.16 0.60 - 1.60 ng/dL  TSH  Result Value Ref Range   TSH 2.83 0.35 - 5.50 uIU/mL    Assessment and Plan  Epigastric pain Assessment & Plan: Acute, differential includes gastritis, peptic ulcer or duodenal ulcer, pancreatitis.  Patient is status post cholecystectomy. Will begin evaluation with labs to check CBC, lipase and complete metabolic panel.  Will check H. pylori breath test.  Recommend avoiding acidic foods.  Increase omeprazole to 40 mg p.o. daily.  Return and ER precautions provided  Orders: -     Comprehensive metabolic panel -     Lipase -     CBC with Differential/Platelet -     H. pylori breath test    No follow-ups on file.   Kerby Nora, MD

## 2023-03-09 NOTE — Patient Instructions (Addendum)
Can use tylenol for headache. Avoid NSAID and aspirin. Avoid acidic foods as discussed. Increase omeprazole to 40 mg p.o. daily. We will call with lab results.

## 2023-03-10 ENCOUNTER — Other Ambulatory Visit: Payer: Self-pay | Admitting: Family Medicine

## 2023-03-10 DIAGNOSIS — R748 Abnormal levels of other serum enzymes: Secondary | ICD-10-CM

## 2023-03-13 DIAGNOSIS — H43813 Vitreous degeneration, bilateral: Secondary | ICD-10-CM | POA: Diagnosis not present

## 2023-03-13 DIAGNOSIS — H40003 Preglaucoma, unspecified, bilateral: Secondary | ICD-10-CM | POA: Diagnosis not present

## 2023-03-14 ENCOUNTER — Encounter: Payer: Self-pay | Admitting: Family Medicine

## 2023-03-14 ENCOUNTER — Ambulatory Visit: Payer: BC Managed Care – PPO | Admitting: Family Medicine

## 2023-03-14 VITALS — BP 120/84 | HR 64 | Temp 97.3°F | Ht 63.5 in | Wt 190.1 lb

## 2023-03-14 DIAGNOSIS — R1013 Epigastric pain: Secondary | ICD-10-CM

## 2023-03-14 DIAGNOSIS — K851 Biliary acute pancreatitis without necrosis or infection: Secondary | ICD-10-CM | POA: Diagnosis not present

## 2023-03-14 DIAGNOSIS — R748 Abnormal levels of other serum enzymes: Secondary | ICD-10-CM

## 2023-03-14 DIAGNOSIS — F5081 Binge eating disorder: Secondary | ICD-10-CM | POA: Diagnosis not present

## 2023-03-14 DIAGNOSIS — F331 Major depressive disorder, recurrent, moderate: Secondary | ICD-10-CM | POA: Diagnosis not present

## 2023-03-14 LAB — CBC WITH DIFFERENTIAL/PLATELET
Absolute Monocytes: 521 cells/uL (ref 200–950)
Basophils Absolute: 32 cells/uL (ref 0–200)
Basophils Relative: 0.4 %
Eosinophils Absolute: 87 cells/uL (ref 15–500)
Eosinophils Relative: 1.1 %
HCT: 36.6 % (ref 35.0–45.0)
Hemoglobin: 11.9 g/dL (ref 11.7–15.5)
Lymphs Abs: 3847 cells/uL (ref 850–3900)
MCH: 27.5 pg (ref 27.0–33.0)
MCHC: 32.5 g/dL (ref 32.0–36.0)
MCV: 84.7 fL (ref 80.0–100.0)
MPV: 11.3 fL (ref 7.5–12.5)
Monocytes Relative: 6.6 %
Neutro Abs: 3413 cells/uL (ref 1500–7800)
Neutrophils Relative %: 43.2 %
Platelets: 365 10*3/uL (ref 140–400)
RBC: 4.32 10*6/uL (ref 3.80–5.10)
RDW: 13.5 % (ref 11.0–15.0)
Total Lymphocyte: 48.7 %
WBC: 7.9 10*3/uL (ref 3.8–10.8)

## 2023-03-14 LAB — COMPREHENSIVE METABOLIC PANEL
AG Ratio: 1.7 (calc) (ref 1.0–2.5)
ALT: 17 U/L (ref 6–29)
AST: 17 U/L (ref 10–35)
Albumin: 4.5 g/dL (ref 3.6–5.1)
Alkaline phosphatase (APISO): 139 U/L (ref 37–153)
BUN: 18 mg/dL (ref 7–25)
CO2: 28 mmol/L (ref 20–32)
Calcium: 10.4 mg/dL (ref 8.6–10.4)
Chloride: 104 mmol/L (ref 98–110)
Creat: 0.69 mg/dL (ref 0.50–1.03)
Globulin: 2.6 g/dL (calc) (ref 1.9–3.7)
Glucose, Bld: 97 mg/dL (ref 65–99)
Potassium: 4.3 mmol/L (ref 3.5–5.3)
Sodium: 138 mmol/L (ref 135–146)
Total Bilirubin: 0.6 mg/dL (ref 0.2–1.2)
Total Protein: 7.1 g/dL (ref 6.1–8.1)

## 2023-03-14 LAB — H. PYLORI BREATH TEST: H. pylori Breath Test: NOT DETECTED

## 2023-03-14 LAB — LIPASE: Lipase: 35 U/L (ref 7–60)

## 2023-03-14 LAB — GAMMA GT: GGT: 55 U/L (ref 3–70)

## 2023-03-14 LAB — AMYLASE: Amylase: 29 U/L (ref 21–101)

## 2023-03-14 NOTE — Progress Notes (Signed)
Patient ID: Brandi Terrell, female    DOB: 1965-08-24, 57 y.o.   MRN: [id number]  This visit was conducted in person.  BP 120/84 (BP Location: Left Arm, Patient Position: Sitting, Cuff Size: Large)   Pulse 64   Temp (!) 97.3 F (36.3 C) (Temporal)   Ht 5' 3.5" (1.613 m)   Wt 190 lb 2 oz (86.2 kg)   LMP  (LMP Unknown)   SpO2 98%   BMI 33.15 kg/m    CC:  Chief Complaint  Patient presents with   Abdominal Pain    Follow up    Subjective:   HPI: Brandi Terrell is a 57 y.o. female presenting on 03/14/2023 for Abdominal Pain (Follow up)   Patient recently seen on September 19 with epigastric abdominal pain, bloating and nausea. No emesis, fever.  H. pylori testing was negative but she is on PPI. Lab evaluation showed mildly elevated lipase.  Recommended increase of PPI to 40 mg daily.  Recommended bland liquid diet. Due for recheck of lipase  Patient states she has had minimal improvement in her symptoms.  Still epigastric pain .Marland Kitchen Woke with pain last night.  Today pain has been more constant.       Relevant past medical, surgical, family and social history reviewed and updated as indicated. Interim medical history since our last visit reviewed. Allergies and medications reviewed and updated. Outpatient Medications Prior to Visit  Medication Sig Dispense Refill   amLODipine-valsartan (EXFORGE) 10-320 MG tablet Take 1 tablet by mouth daily.     B-COMPLEX, FOLIC ACID, PO Take by mouth.     lamoTRIgine (LAMICTAL) 200 MG tablet Take 200 mg by mouth daily.     Magnesium Citrate 100 MG CAPS Take 100 mg by mouth daily.     metoprolol succinate (TOPROL-XL) 100 MG 24 hr tablet TAKE 1 TABLET BY MOUTH EVERY DAY WITH OR IMMEDIATELY FOLLOWING A MEAL 90 tablet 3   zolpidem (AMBIEN) 5 MG tablet Take 5 mg by mouth at bedtime.     omeprazole (PRILOSEC) 20 MG capsule Take 20 mg by mouth daily.     No facility-administered medications prior to visit.     Per HPI unless  specifically indicated in ROS section below Review of Systems  Constitutional:  Negative for fatigue and fever.  HENT:  Negative for congestion.   Eyes:  Negative for pain.  Respiratory:  Negative for cough and shortness of breath.   Cardiovascular:  Negative for chest pain, palpitations and leg swelling.  Gastrointestinal:  Negative for abdominal pain.  Genitourinary:  Negative for dysuria and vaginal bleeding.  Musculoskeletal:  Negative for back pain.  Neurological:  Negative for syncope, light-headedness and headaches.  Psychiatric/Behavioral:  Negative for dysphoric mood.    Objective:  BP 120/84 (BP Location: Left Arm, Patient Position: Sitting, Cuff Size: Large)   Pulse 64   Temp (!) 97.3 F (36.3 C) (Temporal)   Ht 5' 3.5" (1.613 m)   Wt 190 lb 2 oz (86.2 kg)   LMP  (LMP Unknown)   SpO2 98%   BMI 33.15 kg/m   Wt Readings from Last 3 Encounters:  03/14/23 190 lb 2 oz (86.2 kg)  03/09/23 191 lb 3.2 oz (86.7 kg)  10/25/22 190 lb 12.8 oz (86.5 kg)      Physical Exam Constitutional:      General: She is not in acute distress.    Appearance: Normal appearance. She is well-developed. She is not ill-appearing or toxic-appearing.  HENT:     Head: Normocephalic.     Right Ear: Hearing, tympanic membrane, ear canal and external ear normal. Tympanic membrane is not erythematous, retracted or bulging.     Left Ear: Hearing, tympanic membrane, ear canal and external ear normal. Tympanic membrane is not erythematous, retracted or bulging.     Nose: No mucosal edema or rhinorrhea.     Right Sinus: No maxillary sinus tenderness or frontal sinus tenderness.     Left Sinus: No maxillary sinus tenderness or frontal sinus tenderness.     Mouth/Throat:     Mouth: Oropharynx is clear and moist and mucous membranes are normal.     Pharynx: Uvula midline.  Eyes:     General: Lids are normal. Lids are everted, no foreign bodies appreciated.     Extraocular Movements: EOM normal.      Conjunctiva/sclera: Conjunctivae normal.     Pupils: Pupils are equal, round, and reactive to light.  Neck:     Thyroid: No thyroid mass or thyromegaly.     Vascular: No carotid bruit.     Trachea: Trachea normal.  Cardiovascular:     Rate and Rhythm: Normal rate and regular rhythm.     Pulses: Normal pulses.     Heart sounds: Normal heart sounds, S1 normal and S2 normal. No murmur heard.    No friction rub. No gallop.  Pulmonary:     Effort: Pulmonary effort is normal. No tachypnea or respiratory distress.     Breath sounds: Normal breath sounds. No decreased breath sounds, wheezing, rhonchi or rales.  Abdominal:     General: Bowel sounds are normal.     Palpations: Abdomen is soft.     Tenderness: There is no abdominal tenderness.  Musculoskeletal:     Cervical back: Normal range of motion and neck supple.  Skin:    General: Skin is warm, dry and intact.     Findings: No rash.  Neurological:     Mental Status: She is alert.  Psychiatric:        Mood and Affect: Mood is not anxious or depressed.        Speech: Speech normal.        Behavior: Behavior normal. Behavior is cooperative.        Thought Content: Thought content normal.        Cognition and Memory: Cognition and memory normal.        Judgment: Judgment normal.       Results for orders placed or performed in visit on 03/14/23  Amylase  Result Value Ref Range   Amylase 29 21 - 101 U/L  CBC with Differential/Platelet  Result Value Ref Range   WBC 7.9 3.8 - 10.8 Thousand/uL   RBC 4.32 3.80 - 5.10 Million/uL   Hemoglobin 11.9 11.7 - 15.5 g/dL   HCT 16.1 09.6 - 04.5 %   MCV 84.7 80.0 - 100.0 fL   MCH 27.5 27.0 - 33.0 pg   MCHC 32.5 32.0 - 36.0 g/dL   RDW 40.9 81.1 - 91.4 %   Platelets 365 140 - 400 Thousand/uL   MPV 11.3 7.5 - 12.5 fL   Neutro Abs 3,413 1,500 - 7,800 cells/uL   Lymphs Abs 3,847 850 - 3,900 cells/uL   Absolute Monocytes 521 200 - 950 cells/uL   Eosinophils Absolute 87 15 - 500 cells/uL    Basophils Absolute 32 0 - 200 cells/uL   Neutrophils Relative % 43.2 %   Total Lymphocyte 48.7 %  Monocytes Relative 6.6 %   Eosinophils Relative 1.1 %   Basophils Relative 0.4 %  Comprehensive metabolic panel  Result Value Ref Range   Glucose, Bld 97 65 - 99 mg/dL   BUN 18 7 - 25 mg/dL   Creat 1.61 0.96 - 0.45 mg/dL   BUN/Creatinine Ratio SEE NOTE: 6 - 22 (calc)   Sodium 138 135 - 146 mmol/L   Potassium 4.3 3.5 - 5.3 mmol/L   Chloride 104 98 - 110 mmol/L   CO2 28 20 - 32 mmol/L   Calcium 10.4 8.6 - 10.4 mg/dL   Total Protein 7.1 6.1 - 8.1 g/dL   Albumin 4.5 3.6 - 5.1 g/dL   Globulin 2.6 1.9 - 3.7 g/dL (calc)   AG Ratio 1.7 1.0 - 2.5 (calc)   Total Bilirubin 0.6 0.2 - 1.2 mg/dL   Alkaline phosphatase (APISO) 139 37 - 153 U/L   AST 17 10 - 35 U/L   ALT 17 6 - 29 U/L  Gamma GT  Result Value Ref Range   GGT 55 3 - 70 U/L  Lipase  Result Value Ref Range   Lipase 35 7 - 60 U/L    Assessment and Plan  Elevated lipase -     Amylase -     Comprehensive metabolic panel -     Gamma GT -     Lipase  Epigastric abdominal pain -     Amylase -     CBC with Differential/Platelet -     Comprehensive metabolic panel -     Gamma GT -     Lipase  Acute biliary pancreatitis without infection or necrosis -     CT ABDOMEN PELVIS WO CONTRAST; Future  Symptoms remain most consistent with gastritis versus peptic ulcer disease.  Given slightly elevated lipase we will recheck this today along with an amylase and gamma GGT.  Will move forward with CT abdomen pelvis with contrast to rule out biliary stone. She will continue increased dose of proton pump inhibitor and consider GI follow-up.  No follow-ups on file.   Kerby Nora, MD

## 2023-03-15 ENCOUNTER — Ambulatory Visit
Admission: RE | Admit: 2023-03-15 | Discharge: 2023-03-15 | Disposition: A | Discharge status: Home / Self Care | Payer: BC Managed Care – PPO | Source: Ambulatory Visit | Attending: Family Medicine | Admitting: Family Medicine

## 2023-03-15 ENCOUNTER — Encounter: Payer: Self-pay | Admitting: Family Medicine

## 2023-03-15 DIAGNOSIS — R1013 Epigastric pain: Secondary | ICD-10-CM | POA: Diagnosis not present

## 2023-03-15 DIAGNOSIS — K851 Biliary acute pancreatitis without necrosis or infection: Secondary | ICD-10-CM | POA: Insufficient documentation

## 2023-03-15 DIAGNOSIS — E7800 Pure hypercholesterolemia, unspecified: Secondary | ICD-10-CM

## 2023-03-15 DIAGNOSIS — Z9049 Acquired absence of other specified parts of digestive tract: Secondary | ICD-10-CM | POA: Diagnosis not present

## 2023-03-15 DIAGNOSIS — I700 Atherosclerosis of aorta: Secondary | ICD-10-CM

## 2023-03-16 ENCOUNTER — Encounter: Payer: Self-pay | Admitting: *Deleted

## 2023-03-16 DIAGNOSIS — R4189 Other symptoms and signs involving cognitive functions and awareness: Secondary | ICD-10-CM | POA: Diagnosis not present

## 2023-03-16 DIAGNOSIS — D329 Benign neoplasm of meninges, unspecified: Secondary | ICD-10-CM | POA: Diagnosis not present

## 2023-03-16 DIAGNOSIS — R748 Abnormal levels of other serum enzymes: Secondary | ICD-10-CM | POA: Diagnosis not present

## 2023-03-16 DIAGNOSIS — R519 Headache, unspecified: Secondary | ICD-10-CM | POA: Diagnosis not present

## 2023-03-16 MED ORDER — OMEPRAZOLE 40 MG PO CPDR: 40.0000 mg | DELAYED_RELEASE_CAPSULE | Freq: Every day | ORAL | 3 refills | Status: DC

## 2023-03-24 ENCOUNTER — Ambulatory Visit
Admission: RE | Admit: 2023-03-24 | Discharge: 2023-03-24 | Disposition: A | Discharge status: Home / Self Care | Payer: BC Managed Care – PPO | Source: Ambulatory Visit | Attending: Family Medicine | Admitting: Family Medicine

## 2023-03-24 DIAGNOSIS — E7800 Pure hypercholesterolemia, unspecified: Secondary | ICD-10-CM | POA: Insufficient documentation

## 2023-03-24 DIAGNOSIS — I700 Atherosclerosis of aorta: Secondary | ICD-10-CM | POA: Insufficient documentation

## 2023-04-07 ENCOUNTER — Encounter: Payer: Self-pay | Admitting: Family Medicine

## 2023-04-16 ENCOUNTER — Telehealth: Payer: BC Managed Care – PPO | Admitting: Family

## 2023-04-16 DIAGNOSIS — J209 Acute bronchitis, unspecified: Secondary | ICD-10-CM

## 2023-04-16 MED ORDER — PREDNISONE 10 MG (21) PO TBPK: ORAL_TABLET | ORAL | 0 refills | Status: DC

## 2023-04-16 MED ORDER — BENZONATATE 100 MG PO CAPS: 100.0000 mg | ORAL_CAPSULE | Freq: Three times a day (TID) | ORAL | 0 refills | Status: DC | PRN

## 2023-04-16 MED ORDER — PROMETHAZINE-DM 6.25-15 MG/5ML PO SYRP
5.0000 mL | ORAL_SOLUTION | Freq: Three times a day (TID) | ORAL | 0 refills | Status: DC | PRN
Start: 2023-04-16 — End: 2023-04-26

## 2023-04-16 NOTE — Progress Notes (Signed)
Virtual Visit Consent   Brandi Terrell, you are scheduled for a virtual visit with a [facility] provider today. Just as with appointments in the office, your consent must be obtained to participate. Your consent will be active for this visit and any virtual visit you may have with one of our providers in the next 365 days. If you have a MyChart account, a copy of this consent can be sent to you electronically.  As this is a virtual visit, video technology does not allow for your provider to perform a traditional examination. This may limit your provider's ability to fully assess your condition. If your provider identifies any concerns that need to be evaluated in person or the need to arrange testing (such as labs, EKG, etc.), we will make arrangements to do so. Although advances in technology are sophisticated, we cannot ensure that it will always work on either your end or our end. If the connection with a video visit is poor, the visit may have to be switched to a telephone visit. With either a video or telephone visit, we are not always able to ensure that we have a secure connection.  By engaging in this virtual visit, you consent to the provision of healthcare and authorize for your insurance to be billed (if applicable) for the services provided during this visit. Depending on your insurance coverage, you may receive a charge related to this service.  I need to obtain your verbal consent now. Are you willing to proceed with your visit today? Brandi Terrell has provided verbal consent on 04/16/2023 for a virtual visit (video or telephone). Brandi Rodney, FNP  Date: 04/16/2023 6:54 PM  Virtual Visit via Video Note   I, Brandi Terrell, connected with  Brandi Terrell  ([id number], 1959/08/16) on 04/16/23 at  7:00 PM EDT by a video-enabled telemedicine application and verified that I am speaking with the correct person using two identifiers.  Location: Patient: Virtual Visit Location  Patient: work Provider: Pharmacist, community: Home Office   I discussed the limitations of evaluation and management by telemedicine and the availability of in person appointments. The patient expressed understanding and agreed to proceed.    History of Present Illness: Brandi Terrell is a 57 y.o. who identifies as a female who was assigned female at birth, and is being seen today for cough for the last two weeks. She reports she had URI two weeks ago and continues to cough and have congestion.  HPI: Cough This is a new problem. The current episode started 1 to 4 weeks ago. The problem has been waxing and waning. The problem occurs every few minutes. Associated symptoms include nasal congestion and postnasal drip. Pertinent negatives include no chills, ear congestion, ear pain, fever, headaches, shortness of breath or wheezing. She has tried rest for the symptoms. The treatment provided mild relief.    Problems:  Patient Active Problem List   Diagnosis Date Noted   Epigastric pain 03/09/2023   Malignant melanoma (HCC) 02/02/2023   Trapezius strain, right, initial encounter 10/25/2022   Mild cognitive impairment 07/22/2022   Chronic daily headache 07/22/2022   Acute pain of left knee 06/23/2022   Forehead trauma 06/23/2022   Abnormal serum thyroxine (T4) level 05/20/2022   Grade I internal hemorrhoids 02/17/2022   Aphasia 12/31/2021   Poor balance 12/31/2021   Abnormal mammogram 12/31/2021   External hemorrhoids 03/23/2021   Elevated LFTs 02/25/2021   Hypercholesterolemia 04/26/2019   History of basal cell  cancer 01/26/2017   GAD (generalized anxiety disorder) 01/26/2017   Chronic insomnia 01/26/2017   Hypertension 01/26/2017   Chronic GERD 01/26/2017   Family history of colon cancer 01/26/2017   Prediabetes 01/26/2017   Perineal pain in female, chronic 01/26/2017   Moderate recurrent major depression (HCC) 11/03/2016   B12 deficiency 10/24/2016   Obstructive sleep  apnea 11/02/2015   H/O adenomatous polyp of colon 11/02/2015   Basal cell carcinoma of skin of other parts of face 06/24/2014    Allergies:  Allergies  Allergen Reactions   Sulfa Antibiotics Hives   Levofloxacin Diarrhea   Tramadol Hives   Mirabegron Other (See Comments)    Blurred vision   Cephalosporins Itching and Rash   Medications:  Current Outpatient Medications:    benzonatate (TESSALON PERLES) 100 MG capsule, Take 1 capsule (100 mg total) by mouth 3 (three) times daily as needed., Disp: 20 capsule, Rfl: 0   predniSONE (STERAPRED UNI-PAK 21 TAB) 10 MG (21) TBPK tablet, Use as directed, Disp: 21 tablet, Rfl: 0   promethazine-dextromethorphan (PROMETHAZINE-DM) 6.25-15 MG/5ML syrup, Take 5 mLs by mouth 3 (three) times daily as needed for cough., Disp: 118 mL, Rfl: 0   amLODipine-valsartan (EXFORGE) 10-320 MG tablet, Take 1 tablet by mouth daily., Disp: , Rfl:    B-COMPLEX, FOLIC ACID, PO, Take by mouth., Disp: , Rfl:    lamoTRIgine (LAMICTAL) 200 MG tablet, Take 200 mg by mouth daily., Disp: , Rfl:    Magnesium Citrate 100 MG CAPS, Take 100 mg by mouth daily., Disp: , Rfl:    metoprolol succinate (TOPROL-XL) 100 MG 24 hr tablet, TAKE 1 TABLET BY MOUTH EVERY DAY WITH OR IMMEDIATELY FOLLOWING A MEAL, Disp: 90 tablet, Rfl: 3   omeprazole (PRILOSEC) 40 MG capsule, Take 1 capsule (40 mg total) by mouth daily., Disp: 30 capsule, Rfl: 3   zolpidem (AMBIEN) 5 MG tablet, Take 5 mg by mouth at bedtime., Disp: , Rfl:   Observations/Objective: Patient is well-developed, well-nourished in no acute distress.  Resting comfortably    Head is normocephalic, atraumatic.  No labored breathing.  Speech is clear and coherent with logical content.  Patient is alert and oriented at baseline.  Dry cough  Assessment and Plan: 1. Acute bronchitis, unspecified organism - predniSONE (STERAPRED UNI-PAK 21 TAB) 10 MG (21) TBPK tablet; Use as directed  Dispense: 21 tablet; Refill: 0 -  promethazine-dextromethorphan (PROMETHAZINE-DM) 6.25-15 MG/5ML syrup; Take 5 mLs by mouth 3 (three) times daily as needed for cough.  Dispense: 118 mL; Refill: 0 - benzonatate (TESSALON PERLES) 100 MG capsule; Take 1 capsule (100 mg total) by mouth 3 (three) times daily as needed.  Dispense: 20 capsule; Refill: 0  - Take meds as prescribed - Use a cool mist humidifier  -Use saline nose sprays frequently -Force fluids -For any cough or congestion  Use plain Mucinex- regular strength or max strength is fine -For fever or aces or pains- take tylenol or ibuprofen. -Throat lozenges if help -Follow up if symptoms worsen or do not improve   Follow Up Instructions: I discussed the assessment and treatment plan with the patient. The patient was provided an opportunity to ask questions and all were answered. The patient agreed with the plan and demonstrated an understanding of the instructions.  A copy of instructions were sent to the patient via MyChart unless otherwise noted below.    The patient was advised to call back or seek an in-person evaluation if the symptoms worsen or if the condition fails  to improve as anticipated.    Brandi Rodney, FNP

## 2023-04-21 DIAGNOSIS — F331 Major depressive disorder, recurrent, moderate: Secondary | ICD-10-CM | POA: Diagnosis not present

## 2023-04-26 ENCOUNTER — Encounter: Payer: Self-pay | Admitting: Family Medicine

## 2023-04-26 ENCOUNTER — Telehealth: Payer: BC Managed Care – PPO | Admitting: Family Medicine

## 2023-04-26 VITALS — Ht 63.5 in | Wt 190.0 lb

## 2023-04-26 DIAGNOSIS — E66811 Obesity, class 1: Secondary | ICD-10-CM

## 2023-04-26 DIAGNOSIS — I10 Essential (primary) hypertension: Secondary | ICD-10-CM

## 2023-04-26 DIAGNOSIS — Z6833 Body mass index (BMI) 33.0-33.9, adult: Secondary | ICD-10-CM

## 2023-04-26 DIAGNOSIS — E7800 Pure hypercholesterolemia, unspecified: Secondary | ICD-10-CM | POA: Diagnosis not present

## 2023-04-26 DIAGNOSIS — E6609 Other obesity due to excess calories: Secondary | ICD-10-CM | POA: Diagnosis not present

## 2023-04-26 MED ORDER — TIRZEPATIDE-WEIGHT MANAGEMENT 2.5 MG/0.5ML ~~LOC~~ SOLN: 2.5000 mg | SUBCUTANEOUS | 0 refills | Status: DC

## 2023-04-26 NOTE — Progress Notes (Signed)
VIRTUAL VISIT A virtual visit is felt to be most appropriate for this patient at this time.   I connected with the patient on 04/26/23 at  9:00 AM EST by virtual telehealth platform and verified that I am speaking with the correct person using two identifiers.   I discussed the limitations, risks, security and privacy concerns of performing an evaluation and management service by  virtual telehealth platform and the availability of in person appointments. I also discussed with the patient that there may be a patient responsible charge related to this service. The patient expressed understanding and agreed to proceed.  Patient location: Home Provider Location: [facility] Participants: Felica Chargois Ermalene Searing and LORIEN SHINGLER  Height 5' 3.5" (1.613 m), weight 190 lb (86.2 kg).   Chief Complaint  Patient presents with   Weight Loss    Wants to discuss trying to loose weight     History of Present Illness:  57 y.o. female patient of Tameka Hoiland E, MD presents  for weight management.   She has been struggling with weight loss despite low carb diet. Cut out soda. She uses [employer]  as app on phone for tracking.  She is interested in medication to treat weight loss.   In past she has lost weight with keto diet... down to 150.   She is not currently exercise.. plan to start elliptical at home.Marland Kitchen Has history of knee.   CVD risk factors:  hypertension, prediabetes and hypercholesterolemia  No abdominal pain since increasing PPI. Recently there was some mild concern for possible pancreatitis with temporarily elevated lipase but CT scan showed no pancreatitis.  LFTs all were in normal range recheck.  COVID 19 screen No recent travel or known exposure to COVID19 The patient denies respiratory symptoms of COVID 19 at this time.  The importance of social distancing was discussed today.   Review of Systems  Constitutional:  Negative for chills and fever.  HENT:  Negative for congestion  and ear pain.   Eyes:  Negative for pain and redness.  Respiratory:  Negative for cough and shortness of breath.   Cardiovascular:  Negative for chest pain, palpitations and leg swelling.  Gastrointestinal:  Negative for abdominal pain, blood in stool, constipation, diarrhea, nausea and vomiting.  Genitourinary:  Negative for dysuria.  Musculoskeletal:  Negative for falls and myalgias.  Skin:  Negative for rash.  Neurological:  Negative for dizziness.  Psychiatric/Behavioral:  Negative for depression. The patient is not nervous/anxious.       Past Medical History:  Diagnosis Date   Acid reflux    Basal cell carcinoma (BCC) of face    Depression    Hypertension    Inflammatory polyps of colon (HCC)    Sleep apnea     reports that she has never smoked. She has never used smokeless tobacco. She reports current alcohol use. She reports that she does not use drugs.   Current Outpatient Medications:    amLODipine-valsartan (EXFORGE) 10-320 MG tablet, Take 1 tablet by mouth daily., Disp: , Rfl:    lamoTRIgine (LAMICTAL) 200 MG tablet, Take 200 mg by mouth daily., Disp: , Rfl:    Magnesium Citrate 100 MG CAPS, Take 100 mg by mouth daily., Disp: , Rfl:    metoprolol succinate (TOPROL-XL) 100 MG 24 hr tablet, TAKE 1 TABLET BY MOUTH EVERY DAY WITH OR IMMEDIATELY FOLLOWING A MEAL, Disp: 90 tablet, Rfl: 3   omeprazole (PRILOSEC) 40 MG capsule, Take 1 capsule (40 mg total) by  mouth daily., Disp: 30 capsule, Rfl: 3   tirzepatide (ZEPBOUND) 2.5 MG/0.5ML injection vial, Inject 2.5 mg into the skin once a week., Disp: 2 mL, Rfl: 0   zolpidem (AMBIEN) 5 MG tablet, Take 5 mg by mouth at bedtime., Disp: , Rfl:    Observations/Objective: Height 5' 3.5" (1.613 m), weight 190 lb (86.2 kg).  Physical Exam Constitutional:      General: The patient is not in acute distress. Pulmonary:     Effort: Pulmonary effort is normal. No respiratory distress.  Neurological:     Mental Status: The patient is alert  and oriented to person, place, and time.  Psychiatric:        Mood and Affect: Mood normal.        Behavior: Behavior normal.    Assessment and Plan Class 1 obesity due to excess calories with serious comorbidity and body mass index (BMI) of 33.0 to 33.9 in adult Assessment & Plan: Chronic, she has been unable to maintain weight loss with lifestyle changes alone.  She has been working aggressively on low carbohydrate diet.  She plans to restart regular exercise: Elliptical. She has multiple CVD risk factors including hypertension, high cholesterol and prediabetes. She would be an excellent candidate for weight management with GLP-1 GIP medication for CVD risk reduction.  Plan starting initial dose of Zepbound at 2.5 mg, with plan to increase to 5 mg after 4 weeks if tolerated.  Discussed medication side effects and planned time course. Discussed red flags for pancreatitis.  If abdominal pain returns on this medication she will stop it immediately.  Return and ER precautions provided  Orders: -     Tirzepatide-Weight Management; Inject 2.5 mg into the skin once a week.  Dispense: 2 mL; Refill: 0  Primary hypertension -     Tirzepatide-Weight Management; Inject 2.5 mg into the skin once a week.  Dispense: 2 mL; Refill: 0  Hypercholesterolemia -     Tirzepatide-Weight Management; Inject 2.5 mg into the skin once a week.  Dispense: 2 mL; Refill: 0      I discussed the assessment and treatment plan with the patient. The patient was provided an opportunity to ask questions and all were answered. The patient agreed with the plan and demonstrated an understanding of the instructions.   The patient was advised to call back or seek an in-person evaluation if the symptoms worsen or if the condition fails to improve as anticipated.     Kerby Nora, MD

## 2023-04-26 NOTE — Assessment & Plan Note (Signed)
Chronic, she has been unable to maintain weight loss with lifestyle changes alone.  She has been working aggressively on low carbohydrate diet.  She plans to restart regular exercise: Elliptical. She has multiple CVD risk factors including hypertension, high cholesterol and prediabetes. She would be an excellent candidate for weight management with GLP-1 GIP medication for CVD risk reduction.  Plan starting initial dose of Zepbound at 2.5 mg, with plan to increase to 5 mg after 4 weeks if tolerated.  Discussed medication side effects and planned time course. Discussed red flags for pancreatitis.  If abdominal pain returns on this medication she will stop it immediately.  Return and ER precautions provided

## 2023-04-28 DIAGNOSIS — Z23 Encounter for immunization: Secondary | ICD-10-CM | POA: Diagnosis not present

## 2023-05-01 ENCOUNTER — Other Ambulatory Visit (HOSPITAL_COMMUNITY): Payer: Self-pay

## 2023-05-02 ENCOUNTER — Encounter: Payer: Self-pay | Admitting: Family Medicine

## 2023-05-05 ENCOUNTER — Telehealth: Payer: Self-pay | Admitting: *Deleted

## 2023-05-05 ENCOUNTER — Ambulatory Visit: Payer: BC Managed Care – PPO | Attending: Cardiology | Admitting: Cardiology

## 2023-05-05 ENCOUNTER — Encounter: Payer: Self-pay | Admitting: Cardiology

## 2023-05-05 VITALS — BP 124/86 | HR 66 | Ht 64.0 in | Wt 187.6 lb

## 2023-05-05 DIAGNOSIS — R7303 Prediabetes: Secondary | ICD-10-CM

## 2023-05-05 DIAGNOSIS — E7800 Pure hypercholesterolemia, unspecified: Secondary | ICD-10-CM

## 2023-05-05 DIAGNOSIS — M79605 Pain in left leg: Secondary | ICD-10-CM

## 2023-05-05 DIAGNOSIS — E538 Deficiency of other specified B group vitamins: Secondary | ICD-10-CM

## 2023-05-05 DIAGNOSIS — R072 Precordial pain: Secondary | ICD-10-CM

## 2023-05-05 DIAGNOSIS — R0609 Other forms of dyspnea: Secondary | ICD-10-CM | POA: Diagnosis not present

## 2023-05-05 DIAGNOSIS — M79604 Pain in right leg: Secondary | ICD-10-CM

## 2023-05-05 DIAGNOSIS — I10 Essential (primary) hypertension: Secondary | ICD-10-CM

## 2023-05-05 DIAGNOSIS — I700 Atherosclerosis of aorta: Secondary | ICD-10-CM | POA: Diagnosis not present

## 2023-05-05 MED ORDER — METOPROLOL TARTRATE 100 MG PO TABS
100.0000 mg | ORAL_TABLET | Freq: Once | ORAL | 0 refills | Status: DC
Start: 2023-05-05 — End: 2023-06-29

## 2023-05-05 MED ORDER — ATORVASTATIN CALCIUM 20 MG PO TABS: 20.0000 mg | ORAL_TABLET | Freq: Every day | ORAL | 3 refills | Status: DC

## 2023-05-05 NOTE — Progress Notes (Signed)
Cardiology Office Note:    Date:  05/05/2023   ID:  Brandi Terrell, DOB 12/15/1965, MRN [id number]  PCP:  Excell Seltzer, MD   [facility] Providers Cardiologist:  Debbe Odea, MD     Referring MD: Excell Seltzer, MD   Chief Complaint  Patient presents with   New Patient (Initial Visit)    Referred for cardiac evaluation of aortic atherosclerosis and high cholesterol.  seen by Dr. Mariah Milling in  2020.  Has not started Zepbound yet due to not covered on insurance.    Brandi Terrell is a 57 y.o. female who is being seen today for the evaluation of aortic atherosclerosis at the request of Ermalene Searing, Luberta Robertson, MD.   History of Present Illness:    Brandi Terrell is a 57 y.o. female with a hx of hypertension, hyperlipidemia, OSA presenting with aortic atherosclerosis.  States having shortness of breath with exertion ongoing several years.  Denies smoking.  Also has some chest pain/pressure when she bends her upper body.  Significant shortness of breath when she goes uphill.  States having a history of obstructive sleep apnea.  Did not tolerate CPAP mask in the past.  Complains of bilateral leg pain with walking.  Patient had an abdominal CT 03/15/2023 due to abdominal pain, abdominal aortic atherosclerosis noted.  CT coronary calcium score 03/24/2023 was 0.  CT coronary calcium score 2021, score 0 Lexiscan Myoview 12/2015 no significant ischemia Echo 2017 EF 60 to 65%  Past Medical History:  Diagnosis Date   Acid reflux    Basal cell carcinoma (BCC) of face    Depression    Hypertension    Inflammatory polyps of colon (HCC)    Sleep apnea     Past Surgical History:  Procedure Laterality Date   APPENDECTOMY  1976   CHOLECYSTECTOMY  2005    Current Medications: Current Meds  Medication Sig   amLODipine-valsartan (EXFORGE) 10-320 MG tablet Take 1 tablet by mouth daily.   atorvastatin (LIPITOR) 20 MG tablet Take 1 tablet (20 mg total) by mouth daily.    lamoTRIgine (LAMICTAL) 200 MG tablet Take 200 mg by mouth daily.   Magnesium Citrate 200 MG TABS Take 200 mg by mouth daily.   metoprolol succinate (TOPROL-XL) 100 MG 24 hr tablet TAKE 1 TABLET BY MOUTH EVERY DAY WITH OR IMMEDIATELY FOLLOWING A MEAL   metoprolol tartrate (LOPRESSOR) 100 MG tablet Take 1 tablet (100 mg total) by mouth once for 1 dose. TWO HOURS PRIOR TO CARDIAC CTA   omeprazole (PRILOSEC) 40 MG capsule Take 1 capsule (40 mg total) by mouth daily.   zolpidem (AMBIEN) 5 MG tablet Take 5 mg by mouth at bedtime.     Allergies:   Sulfa antibiotics, Levofloxacin, Tramadol, Mirabegron, and Cephalosporins   Social History   Socioeconomic History   Marital status: Married    Spouse name: Not on file   Number of children: 0   Years of education: Not on file   Highest education level: Bachelor's degree (e.g., BA, AB, BS)  Occupational History   Occupation: Accountant  Tobacco Use   Smoking status: Never   Smokeless tobacco: Never  Vaping Use   Vaping status: Never Used  Substance and Sexual Activity   Alcohol use: Yes    Comment: occ   Drug use: No   Sexual activity: Not on file  Other Topics Concern   Not on file  Social History Narrative   Not on file  Social Determinants of Health   Financial Resource Strain: Low Risk  (10/24/2022)   Overall Financial Resource Strain (CARDIA)    Difficulty of Paying Living Expenses: Not hard at all  Food Insecurity: No Food Insecurity (10/24/2022)   Hunger Vital Sign    Worried About Running Out of Food in the Last Year: Never true    Ran Out of Food in the Last Year: Never true  Transportation Needs: No Transportation Needs (10/24/2022)   PRAPARE - Administrator, Civil Service (Medical): No    Lack of Transportation (Non-Medical): No  Physical Activity: Unknown (10/24/2022)   Exercise Vital Sign    Days of Exercise per Week: 0 days    Minutes of Exercise per Session: Not on file  Stress: No Stress Concern Present  (10/24/2022)   [employer] of Occupational Health - Occupational Stress Questionnaire    Feeling of Stress : Only a little  Social Connections: Unknown (10/24/2022)   Social Connection and Isolation Panel [NHANES]    Frequency of Communication with Friends and Family: More than three times a week    Frequency of Social Gatherings with Friends and Family: Once a week    Attends Religious Services: Patient declined    Database administrator or Organizations: No    Attends Engineer, structural: Not on file    Marital Status: Married     Family History: The patient's family history includes Cancer in her maternal grandfather; Cancer (age of onset: 8) in her father; Depression in her father and mother; Heart disease in her paternal grandmother; Hypertension in her father, mother, and paternal grandmother; Skin cancer in her father; Stroke in her maternal grandfather, maternal grandmother, paternal grandfather, and paternal grandmother.  ROS:   Please see the history of present illness.     All other systems reviewed and are negative.  EKGs/Labs/Other Studies Reviewed:    The following studies were reviewed today:  EKG Interpretation Date/Time:  Friday May 05 2023 11:17:39 EST Ventricular Rate:  66 PR Interval:  198 QRS Duration:  76 QT Interval:  400 QTC Calculation: 419 R Axis:   0  Text Interpretation: Normal sinus rhythm Minimal voltage criteria for LVH, may be normal variant ( R in aVL ) Inferior infarct , age undetermined Confirmed by Debbe Odea ([id number]) on 05/05/2023 11:21:53 AM    Recent Labs: 10/03/2022: TSH 2.83 03/14/2023: ALT 17; BUN 18; Creat 0.69; Hemoglobin 11.9; Platelets 365; Potassium 4.3; Sodium 138  Recent Lipid Panel    Component Value Date/Time   CHOL 200 05/10/2022 0808   TRIG 94.0 05/10/2022 0808   HDL 54.50 05/10/2022 0808   CHOLHDL 4 05/10/2022 0808   VLDL 18.8 05/10/2022 0808   LDLCALC 127 (H) 05/10/2022 0808     Risk  Assessment/Calculations:             Physical Exam:    VS:  BP 124/86 (BP Location: Left Arm, Patient Position: Sitting, Cuff Size: Large)   Pulse 66   Ht 5\' 4"  (1.626 m)   Wt 187 lb 9.6 oz (85.1 kg)   LMP  (LMP Unknown)   SpO2 98%   BMI 32.20 kg/m     Wt Readings from Last 3 Encounters:  05/05/23 187 lb 9.6 oz (85.1 kg)  04/26/23 190 lb (86.2 kg)  03/14/23 190 lb 2 oz (86.2 kg)     GEN:  Well nourished, well developed in no acute distress HEENT: Normal NECK: No JVD; No carotid bruits  CARDIAC: RRR, no murmurs, rubs, gallops RESPIRATORY:  Clear to auscultation without rales, wheezing or rhonchi  ABDOMEN: Soft, non-tender, non-distended MUSCULOSKELETAL:  No edema; No deformity  SKIN: Warm and dry NEUROLOGIC:  Alert and oriented x 3 PSYCHIATRIC:  Normal affect   ASSESSMENT:    1. Dyspnea on exertion   2. Precordial pain   3. Pure hypercholesterolemia   4. Aortic atherosclerosis (HCC)   5. Primary hypertension   6. Pain in both lower extremities    PLAN:    In order of problems listed above:  Dyspnea on exertion, chest pain with bending.  Recent calcium score was 0, unsure if patient has noncalcified plaque.  Obtain coronary CT to rule out CAD.  Repeat echocardiogram.  Dyspnea could be secondary to also treated sleep apnea, deconditioning.  Will refer patient to sleep specialist. Hyperlipidemia, aortic atherosclerosis noted on abdominal CT.  Start Lipitor 20 mg daily. Leg pain/thigh pain with walking.  Obtain ABIs. Hypertension, BP controlled.  Continue Exforge.  Follow-up after cardiac testing.     Medication Adjustments/Labs and Tests Ordered: Current medicines are reviewed at length with the patient today.  Concerns regarding medicines are outlined above.  Orders Placed This Encounter  Procedures   CT CORONARY MORPH W/CTA COR W/SCORE W/CA W/CM &/OR WO/CM   Basic Metabolic Panel (BMET)   Ambulatory referral to Pulmonology   EKG 12-Lead   ECHOCARDIOGRAM  COMPLETE   VAS [name] ABI WITH/WO TBI   VAS [name] LOWER EXTREMITY ARTERIAL DUPLEX   Meds ordered this encounter  Medications   metoprolol tartrate (LOPRESSOR) 100 MG tablet    Sig: Take 1 tablet (100 mg total) by mouth once for 1 dose. TWO HOURS PRIOR TO CARDIAC CTA    Dispense:  1 tablet    Refill:  0   atorvastatin (LIPITOR) 20 MG tablet    Sig: Take 1 tablet (20 mg total) by mouth daily.    Dispense:  90 tablet    Refill:  3    Patient Instructions  Medication Instructions:   START Atorvastatin - Take one tablet ( 20mg ) by mouth daily.   *If you need a refill on your cardiac medications before your next appointment, please call your pharmacy*   Lab Work:  Your physician recommends you have labs - BMP   If you have labs (blood work) drawn today and your tests are completely normal, you will receive your results only by: MyChart Message (if you have MyChart) OR A paper copy in the mail If you have any lab test that is abnormal or we need to change your treatment, we will call you to review the results.   Testing/Procedures:  Your physician has requested that you have an echocardiogram. Echocardiography is a painless test that uses sound waves to create images of your heart. It provides your doctor with information about the size and shape of your heart and how well your heart's chambers and valves are working. This procedure takes approximately one hour. There are no restrictions for this procedure. Please do NOT wear cologne, perfume, aftershave, or lotions (deodorant is allowed). Please arrive 15 minutes prior to your appointment time.  Please note: We ask at that you not bring children with you during ultrasound (echo/ vascular) testing. Due to room size and safety concerns, children are not allowed in the ultrasound rooms during exams. Our front office staff cannot provide observation of children in our lobby area while testing is being conducted. An adult accompanying a patient  to  their appointment will only be allowed in the ultrasound room at the discretion of the ultrasound technician under special circumstances. We apologize for any inconvenience.  2. Your physician has requested that you have a lower extremity arterial exercise duplex. During this test, exercise and ultrasound are used to evaluate arterial blood flow in the legs. Allow one hour for this exam. There are no restrictions or special instructions. Your physician has requested that you have an ankle brachial index (ABI). During this test an ultrasound and blood pressure cuff are used to evaluate the arteries that supply the arms and legs with blood. Allow thirty minutes for this exam. There are no restrictions or special instructions.  Please note: We ask at that you not bring children with you during ultrasound (echo/ vascular) testing. Due to room size and safety concerns, children are not allowed in the ultrasound rooms during exams. Our front office staff cannot provide observation of children in our lobby area while testing is being conducted. An adult accompanying a patient to their appointment will only be allowed in the ultrasound room at the discretion of the ultrasound technician under special circumstances. We apologize for any inconvenience.   3. Your cardiac CT will be scheduled at one of the below locations:   [facility] [address] [place], [place] [address] [phone]  OR   [facility] [address] [place], [place] [address] [phone]   If scheduled at [facility] or [facility], please arrive 15 mins early for check-in and test prep.  There is spacious parking and easy access to the radiology department from the [facility] V[facility] entrance. Please enter here and check-in with the desk attendant.   Please follow these instructions carefully (unless  otherwise directed):  An IV will be required for this test and Nitroglycerin will be given.  Hold all erectile dysfunction medications at least 3 days (72 hrs) prior to test. (Ie viagra, cialis, sildenafil, tadalafil, etc)   On the Night Before the Test: Be sure to Drink plenty of water. Do not consume any caffeinated/decaffeinated beverages or chocolate 12 hours prior to your test. Do not take any antihistamines 12 hours prior to your test.  On the Day of the Test: Drink plenty of water until 1 hour prior to the test. Do not eat any food 1 hour prior to test. You may take your regular medications prior to the test.  Take metoprolol (Lopressor) two hours prior to test. If you take Furosemide/Hydrochlorothiazide/Spironolactone, please HOLD on the morning of the test. FEMALES- please wear underwire-free bra if available, avoid dresses & tight clothing        After the Test: Drink plenty of water. After receiving IV contrast, you may experience a mild flushed feeling. This is normal. On occasion, you may experience a mild rash up to 24 hours after the test. This is not dangerous. If this occurs, you can take Benadryl 25 mg and increase your fluid intake. If you experience trouble breathing, this can be serious. If it is severe call 911 IMMEDIATELY. If it is mild, please call our office. If you take any of these medications: Glipizide/Metformin, Avandament, Glucavance, please do not take 48 hours after completing test unless otherwise instructed.  We will call to schedule your test 2-4 weeks out understanding that some insurance companies will need an authorization prior to the service being performed.   For more information and frequently asked  questions, please visit our website : [email or web address]  For non-scheduling related questions, please contact the cardiac imaging nurse navigator should you have any questions/concerns: Cardiac Imaging Nurse Navigators Direct Office  Dial: [phone]   For scheduling needs, including cancellations and rescheduling, please call [place], [phone].   Follow-Up: At [facility], you and your health needs are our priority.  As part of our continuing mission to provide you with exceptional heart care, we have created designated Provider Care Teams.  These Care Teams include your primary Cardiologist (physician) and Advanced Practice Providers (APPs -  Physician Assistants and Nurse Practitioners) who all work together to provide you with the care you need, when you need it.  We recommend signing up for the patient portal called "MyChart".  Sign up information is provided on this After Visit Summary.  MyChart is used to connect with patients for Virtual Visits (Telemedicine).  Patients are able to view lab/test results, encounter notes, upcoming appointments, etc.  Non-urgent messages can be sent to your provider as well.   To learn more about what you can do with MyChart, go to [email or web address].    Your next appointment:   8 - 10 weeks  Provider:   You may see Debbe Odea, MD or one of the following Advanced Practice Providers on your designated Care Team:   Nicolasa Ducking, NP Eula Listen, PA-C Cadence Fransico Michael, PA-C Charlsie Quest, NP Carlos Levering, NP   Signed, Debbe Odea, MD  05/05/2023 1:04 PM    [facility]

## 2023-05-05 NOTE — Patient Instructions (Signed)
Medication Instructions:   START Atorvastatin - Take one tablet ( 20mg ) by mouth daily.   *If you need a refill on your cardiac medications before your next appointment, please call your pharmacy*   Lab Work:  Your physician recommends you have labs - BMP   If you have labs (blood work) drawn today and your tests are completely normal, you will receive your results only by: MyChart Message (if you have MyChart) OR A paper copy in the mail If you have any lab test that is abnormal or we need to change your treatment, we will call you to review the results.   Testing/Procedures:  Your physician has requested that you have an echocardiogram. Echocardiography is a painless test that uses sound waves to create images of your heart. It provides your doctor with information about the size and shape of your heart and how well your heart's chambers and valves are working. This procedure takes approximately one hour. There are no restrictions for this procedure. Please do NOT wear cologne, perfume, aftershave, or lotions (deodorant is allowed). Please arrive 15 minutes prior to your appointment time.  Please note: We ask at that you not bring children with you during ultrasound (echo/ vascular) testing. Due to room size and safety concerns, children are not allowed in the ultrasound rooms during exams. Our front office staff cannot provide observation of children in our lobby area while testing is being conducted. An adult accompanying a patient to their appointment will only be allowed in the ultrasound room at the discretion of the ultrasound technician under special circumstances. We apologize for any inconvenience.  2. Your physician has requested that you have a lower extremity arterial exercise duplex. During this test, exercise and ultrasound are used to evaluate arterial blood flow in the legs. Allow one hour for this exam. There are no restrictions or special instructions. Your physician has  requested that you have an ankle brachial index (ABI). During this test an ultrasound and blood pressure cuff are used to evaluate the arteries that supply the arms and legs with blood. Allow thirty minutes for this exam. There are no restrictions or special instructions.  Please note: We ask at that you not bring children with you during ultrasound (echo/ vascular) testing. Due to room size and safety concerns, children are not allowed in the ultrasound rooms during exams. Our front office staff cannot provide observation of children in our lobby area while testing is being conducted. An adult accompanying a patient to their appointment will only be allowed in the ultrasound room at the discretion of the ultrasound technician under special circumstances. We apologize for any inconvenience.   3. Your cardiac CT will be scheduled at one of the below locations:   [facility] [address] [place], [place] [address] [phone]  OR   [facility] [address] [place], [place] [address] [phone]   If scheduled at [facility] or [facility], please arrive 15 mins early for check-in and test prep.  There is spacious parking and easy access to the radiology department from the [facility] Heart and Vascular entrance. Please enter here and check-in with the desk attendant.   Please follow these instructions carefully (unless otherwise directed):  An IV will be required for this test and Nitroglycerin will be given.  Hold all erectile dysfunction medications at least 3 days (72 hrs) prior to test. (Ie viagra, cialis, sildenafil, tadalafil, etc)  On the Night Before the Test: Be sure to Drink plenty of water. Do not consume any caffeinated/decaffeinated beverages or chocolate 12 hours prior to your test. Do not take any antihistamines 12 hours prior to your test.  On the Day  of the Test: Drink plenty of water until 1 hour prior to the test. Do not eat any food 1 hour prior to test. You may take your regular medications prior to the test.  Take metoprolol (Lopressor) two hours prior to test. If you take Furosemide/Hydrochlorothiazide/Spironolactone, please HOLD on the morning of the test. FEMALES- please wear underwire-free bra if available, avoid dresses & tight clothing        After the Test: Drink plenty of water. After receiving IV contrast, you may experience a mild flushed feeling. This is normal. On occasion, you may experience a mild rash up to 24 hours after the test. This is not dangerous. If this occurs, you can take Benadryl 25 mg and increase your fluid intake. If you experience trouble breathing, this can be serious. If it is severe call 911 IMMEDIATELY. If it is mild, please call our office. If you take any of these medications: Glipizide/Metformin, Avandament, Glucavance, please do not take 48 hours after completing test unless otherwise instructed.  We will call to schedule your test 2-4 weeks out understanding that some insurance companies will need an authorization prior to the service being performed.   For more information and frequently asked questions, please visit our website : [email or web address]  For non-scheduling related questions, please contact the cardiac imaging nurse navigator should you have any questions/concerns: Cardiac Imaging Nurse Navigators Direct Office Dial: [phone]   For scheduling needs, including cancellations and rescheduling, please call [place], [phone].   Follow-Up: At [facility], you and your health needs are our priority.  As part of our continuing mission to provide you with exceptional heart care, we have created designated Provider Care Teams.  These Care Teams include your primary Cardiologist (physician) and Advanced Practice Providers (APPs -  Physician Assistants and  Nurse Practitioners) who all work together to provide you with the care you need, when you need it.  We recommend signing up for the patient portal called "MyChart".  Sign up information is provided on this After Visit Summary.  MyChart is used to connect with patients for Virtual Visits (Telemedicine).  Patients are able to view lab/test results, encounter notes, upcoming appointments, etc.  Non-urgent messages can be sent to your provider as well.   To learn more about what you can do with MyChart, go to [email or web address].    Your next appointment:   8 - 10 weeks  Provider:   You may see Debbe Odea, MD or one of the following Advanced Practice Providers on your designated Care Team:   Nicolasa Ducking, NP Eula Listen, PA-C Cadence Fransico Michael, PA-C Charlsie Quest, NP Carlos Levering, NP

## 2023-05-05 NOTE — Telephone Encounter (Signed)
-----   Message from Lovena Neighbours sent at 05/05/2023  1:45 PM EST ----- Regarding: Fasting labs for Monday 12.2.24 Please put physical lab orders in future. Thank you, Denny Peon

## 2023-05-06 LAB — BASIC METABOLIC PANEL
BUN/Creatinine Ratio: 23 (ref 9–23)
BUN: 18 mg/dL (ref 6–24)
CO2: 24 mmol/L (ref 20–29)
Calcium: 10.4 mg/dL — ABNORMAL HIGH (ref 8.7–10.2)
Chloride: 107 mmol/L — ABNORMAL HIGH (ref 96–106)
Creatinine, Ser: 0.8 mg/dL (ref 0.57–1.00)
Glucose: 111 mg/dL — ABNORMAL HIGH (ref 70–99)
Potassium: 4.4 mmol/L (ref 3.5–5.2)
Sodium: 143 mmol/L (ref 134–144)
eGFR: 85 mL/min/{1.73_m2} (ref >59)

## 2023-05-08 ENCOUNTER — Other Ambulatory Visit: Payer: Self-pay | Admitting: *Deleted

## 2023-05-08 MED ORDER — METOPROLOL SUCCINATE ER 100 MG PO TB24: 100.0000 mg | ORAL_TABLET | Freq: Every day | ORAL | 1 refills | Status: DC

## 2023-05-09 ENCOUNTER — Telehealth (HOSPITAL_COMMUNITY): Payer: Self-pay | Admitting: Emergency Medicine

## 2023-05-09 NOTE — Telephone Encounter (Signed)
Reaching out to patient to offer assistance regarding upcoming cardiac imaging study; pt verbalizes understanding of appt date/time, parking situation and where to check in, pre-test NPO status and medications ordered, and verified current allergies; name and call back number provided for further questions should they arise Cayne Yom RN Navigator Cardiac Imaging [facility] [phone] office [phone] cell 

## 2023-05-10 ENCOUNTER — Ambulatory Visit
Admission: RE | Admit: 2023-05-10 | Discharge: 2023-05-10 | Disposition: A | Discharge status: Home / Self Care | Payer: BC Managed Care – PPO | Source: Ambulatory Visit | Attending: Cardiology | Admitting: Cardiology

## 2023-05-10 DIAGNOSIS — R072 Precordial pain: Secondary | ICD-10-CM | POA: Insufficient documentation

## 2023-05-10 MED ORDER — IOHEXOL 350 MG/ML SOLN
100.0000 mL | Freq: Once | INTRAVENOUS | Status: AC | PRN
  Administered 2023-05-10: 100 mL via INTRAVENOUS

## 2023-05-10 MED ORDER — METOPROLOL TARTRATE 5 MG/5ML IV SOLN: 10.0000 mg | Freq: Once | INTRAVENOUS | Status: DC

## 2023-05-10 MED ORDER — NITROGLYCERIN 0.4 MG SL SUBL
0.8000 mg | SUBLINGUAL_TABLET | Freq: Once | SUBLINGUAL | Status: AC
  Administered 2023-05-10: 0.8 mg via SUBLINGUAL

## 2023-05-10 MED ORDER — SODIUM CHLORIDE 0.9 % IV SOLN: INTRAVENOUS | Status: DC

## 2023-05-10 NOTE — Progress Notes (Signed)

## 2023-05-19 ENCOUNTER — Other Ambulatory Visit: Payer: BC Managed Care – PPO

## 2023-05-22 ENCOUNTER — Other Ambulatory Visit: Payer: BC Managed Care – PPO

## 2023-05-25 ENCOUNTER — Ambulatory Visit: Payer: BC Managed Care – PPO | Attending: Cardiology

## 2023-05-25 ENCOUNTER — Encounter: Payer: BC Managed Care – PPO | Admitting: Family Medicine

## 2023-05-25 ENCOUNTER — Ambulatory Visit: Payer: BC Managed Care – PPO

## 2023-05-25 ENCOUNTER — Ambulatory Visit (INDEPENDENT_AMBULATORY_CARE_PROVIDER_SITE_OTHER): Payer: BC Managed Care – PPO

## 2023-05-25 DIAGNOSIS — R072 Precordial pain: Secondary | ICD-10-CM

## 2023-05-25 DIAGNOSIS — R0609 Other forms of dyspnea: Secondary | ICD-10-CM | POA: Diagnosis not present

## 2023-05-25 DIAGNOSIS — M79605 Pain in left leg: Secondary | ICD-10-CM | POA: Diagnosis not present

## 2023-05-25 DIAGNOSIS — M79604 Pain in right leg: Secondary | ICD-10-CM

## 2023-05-25 LAB — ECHOCARDIOGRAM COMPLETE
AR max vel: 2.66 cm2
AV Area VTI: 2.47 cm2
AV Area mean vel: 2.44 cm2
AV Mean grad: 4 mm[Hg]
AV Peak grad: 7.3 mm[Hg]
Ao pk vel: 1.36 m/s
Area-P 1/2: 2.83 cm2
Calc EF: 54.4 %
S' Lateral: 2.3 cm
Single Plane A2C EF: 53.9 %
Single Plane A4C EF: 55.3 %

## 2023-05-25 LAB — VAS US ABI WITH/WO TBI
Left ABI: 1.28
Right ABI: 1.16

## 2023-05-30 DIAGNOSIS — F5101 Primary insomnia: Secondary | ICD-10-CM | POA: Diagnosis not present

## 2023-05-30 DIAGNOSIS — F3181 Bipolar II disorder: Secondary | ICD-10-CM | POA: Diagnosis not present

## 2023-06-05 ENCOUNTER — Encounter: Payer: Self-pay | Admitting: Family Medicine

## 2023-06-27 DIAGNOSIS — F331 Major depressive disorder, recurrent, moderate: Secondary | ICD-10-CM | POA: Diagnosis not present

## 2023-06-29 ENCOUNTER — Ambulatory Visit (INDEPENDENT_AMBULATORY_CARE_PROVIDER_SITE_OTHER): Payer: BC Managed Care – PPO | Admitting: Internal Medicine

## 2023-06-29 ENCOUNTER — Encounter: Payer: Self-pay | Admitting: Internal Medicine

## 2023-06-29 VITALS — BP 128/78 | HR 66 | Temp 98.0°F | Ht 64.0 in | Wt 193.6 lb

## 2023-06-29 DIAGNOSIS — E669 Obesity, unspecified: Secondary | ICD-10-CM | POA: Diagnosis not present

## 2023-06-29 DIAGNOSIS — G4739 Other sleep apnea: Secondary | ICD-10-CM

## 2023-06-29 DIAGNOSIS — Z6833 Body mass index (BMI) 33.0-33.9, adult: Secondary | ICD-10-CM

## 2023-06-29 DIAGNOSIS — G4733 Obstructive sleep apnea (adult) (pediatric): Secondary | ICD-10-CM

## 2023-06-29 NOTE — Patient Instructions (Addendum)
 Recommend Home Sleep Study to assess for OSA Recommend neurology referral assess for seizures  Avoid Allergens and Irritants Avoid secondhand smoke Avoid SICK contacts Recommend  Masking  when appropriate Recommend Keep up-to-date with vaccinations

## 2023-06-29 NOTE — Progress Notes (Signed)
 [facility] Brandi Terrell Consultation      Date: 06/29/2023,   MRN# [id number] Brandi Terrell November 26, 1965    Admission                  Current  Brandi Terrell is a 58 y.o. old female seen in consultation for cough at the request of [facility]  Synopsis:  patient seen for chronic SOB and DOE with voice changes and vocal cord weakness with recurrent bouts of URI's Overall, her resp status is stable and most of her symptoms are from her vocal cord disease Patient is noncompliant with nocturnal oxygen and noncompliant with CPAP   CHIEF COMPLAINT:  Previous history of sleep apnea  Excessive daytime sleepiness   HISTORY OF PRESENT ILLNESS  Patient is seen today for problems and issues with sleep related to excessive daytime sleepiness Patient  has been having sleep problems for many years Patient has been having excessive daytime sleepiness for a long time Patient has been having extreme fatigue and tiredness, lack of energy +  very Loud snoring every night + struggling breathe at night and gasps for air   Discussed sleep data and reviewed with patient.  Encouraged proper weight management.  Discussed driving precautions and its relationship with hypersomnolence.  Discussed operating dangerous equipment and its relationship with hypersomnolence.  Discussed sleep hygiene, and benefits of a fixed sleep waked time.  The importance of getting eight or more hours of sleep discussed with patient.  Discussed limiting the use of the computer and television before bedtime.  Decrease naps during the day, so night time sleep will become enhanced.  Limit caffeine, and sleep deprivation.  HTN, stroke, and heart failure are potential risk factors.   Discussed risk of untreated sleep apnea including cardiac arrhthymias, stroke, DM, pulm HTN.    EPWORTH SLEEP SCORE 10  Previous sleep study 2019 HST showed severe sleep apnea AHI of 31 Patient attempted and tried CPAP machine  however did not tolerated Patient did not follow-up Patient here for reassessment  previously diagnosed with chronic vocal cord dysfunction Patient previously diagnosed with chronic vocal cord dysfunction Seems to have resolved was treated at [facility]   Patient previous use of oxygen at nighttime but noncompliant with oxygen and CPAP therapy She has a history of grade 1 diastolic dysfunction based on echocardiogram  Patient was diagnosed with pancreatitis Subsequently had a CT calcium  score  Patient states that she falls asleep subsequently bites her tongue Patient may need to be set up to be seen by a neurologist  No signs of  She is nonsmoker, howevere Current Medication:  Current Outpatient Medications:    amLODipine -valsartan  (EXFORGE ) 10-320 MG tablet, Take 1 tablet by mouth daily., Disp: , Rfl:    atorvastatin  (LIPITOR) 20 MG tablet, Take 1 tablet (20 mg total) by mouth daily., Disp: 90 tablet, Rfl: 3   lamoTRIgine  (LAMICTAL ) 200 MG tablet, Take 200 mg by mouth daily., Disp: , Rfl:    Magnesium Citrate 200 MG TABS, Take 200 mg by mouth daily., Disp: , Rfl:    metoprolol  succinate (TOPROL -XL) 100 MG 24 hr tablet, Take 1 tablet (100 mg total) by mouth daily. Take with or immediately following a meal., Disp: 90 tablet, Rfl: 1   metoprolol  tartrate (LOPRESSOR ) 100 MG tablet, Take 1 tablet (100 mg total) by mouth once for 1 dose. TWO HOURS PRIOR TO CARDIAC CTA, Disp: 1 tablet, Rfl: 0   omeprazole  (PRILOSEC) 40 MG capsule, Take 1 capsule (40 mg  total) by mouth daily., Disp: 30 capsule, Rfl: 3   tirzepatide  (ZEPBOUND ) 2.5 MG/0.5ML injection vial, Inject 2.5 mg into the skin once a week. (Patient not taking: Reported on 05/05/2023), Disp: 2 mL, Rfl: 0   zolpidem  (AMBIEN ) 5 MG tablet, Take 5 mg by mouth at bedtime., Disp: , Rfl:     ALLERGIES   Sulfa antibiotics, Levofloxacin, Tramadol, Mirabegron, and Cephalosporins    CT chest 01/21/16 images  reviewed No abnormal findings at this  time    BP 128/78 (BP Location: Right Arm, Patient Position: Sitting, Cuff Size: Normal)   Pulse 66   Temp 98 F (36.7 C) (Temporal)   Ht 5' 4 (1.626 m)   Wt 193 lb 9.6 oz (87.8 kg)   LMP  (LMP Unknown)   SpO2 99%   BMI 33.23 kg/m       Review of Systems: Gen:  Denies  fever, sweats, chills weight loss  HEENT: Denies blurred vision, double vision, ear pain, eye pain, hearing loss, nose bleeds, sore throat Cardiac:  No dizziness, chest pain or heaviness, chest tightness,edema, No JVD Resp:   No cough, -sputum production, -shortness of breath,-wheezing, -hemoptysis,  Other:  All other systems negative   Physical Examination:   General Appearance: No distress  EYES PERRLA, EOM intact.   NECK Supple, No JVD Pulmonary: normal breath sounds, No wheezing.  CardiovascularNormal S1,S2.  No m/r/g.   Abdomen: Benign, Soft, non-tender. Neurology UE/LE 5/5 strength, no focal deficits Ext pulses intact, cap refill intact ALL OTHER ROS ARE NEGATIVE   ASSESSMENT/PLAN   58 year old obese white female seen today for follow-up assessment for severe sleep apnea previously diagnosed in 2019 with AHI of 31 in the setting of obesity and deconditioned state with underlying diastolic dysfunction with a previous history of vocal cord dysfunction with deconditioned state, also some symptoms of tongue biting while sleeping   Severe sleep apnea AHI of 31  Repeat home sleep study to assess diagnosis  Plan for CPAP therapy if needed   Obesity -recommend significant weight loss -recommend changing diet  Deconditioned state -Recommend increased daily activity and exercise  Neurology consultation for tongue biting  At this time her vocal cord dysfunction has completely resolved No further work-up at this time Previous pulmonary function testing was within normal limits    MEDICATION ADJUSTMENTS/LABS AND TESTS ORDERED:    CURRENT MEDICATIONS REVIEWED AT LENGTH WITH PATIENT  TODAY   Patient  satisfied with Plan of action and management. All questions answered  Follow up 3 months    Time Spent Involved in Patient Care on Day of Examination: 47 mins     [name], M.D.  [facility]  Medical Director [facility] Medical Director [facility] Cardio-Pulmonary Department

## 2023-07-05 ENCOUNTER — Encounter: Payer: Self-pay | Admitting: Family Medicine

## 2023-07-05 MED ORDER — OMEPRAZOLE 40 MG PO CPDR: 40.0000 mg | DELAYED_RELEASE_CAPSULE | Freq: Every day | ORAL | 2 refills | Status: DC

## 2023-07-11 ENCOUNTER — Encounter: Payer: Self-pay | Admitting: Cardiology

## 2023-07-11 ENCOUNTER — Ambulatory Visit: Payer: BC Managed Care – PPO | Attending: Cardiology | Admitting: Cardiology

## 2023-07-11 VITALS — BP 124/78 | HR 76 | Ht 64.0 in | Wt 190.2 lb

## 2023-07-11 DIAGNOSIS — E7800 Pure hypercholesterolemia, unspecified: Secondary | ICD-10-CM

## 2023-07-11 DIAGNOSIS — M79605 Pain in left leg: Secondary | ICD-10-CM

## 2023-07-11 DIAGNOSIS — M79604 Pain in right leg: Secondary | ICD-10-CM

## 2023-07-11 DIAGNOSIS — R0609 Other forms of dyspnea: Secondary | ICD-10-CM | POA: Diagnosis not present

## 2023-07-11 DIAGNOSIS — I10 Essential (primary) hypertension: Secondary | ICD-10-CM | POA: Diagnosis not present

## 2023-07-11 NOTE — Progress Notes (Signed)
Cardiology Office Note:    Date:  07/11/2023   ID:  Brandi Terrell, DOB 1965-09-25, MRN [id number]  PCP:  Excell Seltzer, MD   [facility] Providers Cardiologist:  Debbe Odea, MD     Referring MD: Excell Seltzer, MD   Chief Complaint  Patient presents with   Follow-up    Discuss cardiac testing results.  Patient concerns with continued left chest pain with bending over.      History of Present Illness:    Brandi Terrell is a 58 y.o. female with a hx of hypertension, hyperlipidemia, OSA presenting for follow-up.    Previously seen with symptoms of dyspnea on exertion.  Echo and coronary CTA obtained to evaluate cardiac etiology.  She still exerts shortness of breath with exertion, endorses discomfort with bending over.  Followed up with sleep specialist, CPAP machine ordered by sleep specialist.   Prior notes/testing abdominal CT 03/15/2023 due to abdominal pain, abdominal aortic atherosclerosis noted. CT coronary calcium score 03/24/2023 was 0. CT coronary calcium score 2021, score 0 Lexiscan Myoview 12/2015 no significant ischemia Echo 2017 EF 60 to 65%  Past Medical History:  Diagnosis Date   Acid reflux    Basal cell carcinoma (BCC) of face    Depression    Hypertension    Inflammatory polyps of colon (HCC)    Sleep apnea     Past Surgical History:  Procedure Laterality Date   APPENDECTOMY  1976   CHOLECYSTECTOMY  2005    Current Medications: Current Meds  Medication Sig   amLODipine-valsartan (EXFORGE) 10-320 MG tablet Take 1 tablet by mouth daily.   atorvastatin (LIPITOR) 20 MG tablet Take 1 tablet (20 mg total) by mouth daily.   lamoTRIgine (LAMICTAL) 200 MG tablet Take 200 mg by mouth daily.   Magnesium Citrate 200 MG TABS Take 200 mg by mouth daily.   metoprolol succinate (TOPROL-XL) 100 MG 24 hr tablet Take 1 tablet (100 mg total) by mouth daily. Take with or immediately following a meal.   omeprazole (PRILOSEC) 40 MG capsule Take 1  capsule (40 mg total) by mouth daily.   zolpidem (AMBIEN) 5 MG tablet Take 5 mg by mouth at bedtime.     Allergies:   Sulfa antibiotics, Levofloxacin, Tramadol, Mirabegron, and Cephalosporins   Social History   Socioeconomic History   Marital status: Married    Spouse name: Not on file   Number of children: 0   Years of education: Not on file   Highest education level: Bachelor's degree (e.g., BA, AB, BS)  Occupational History   Occupation: Accountant  Tobacco Use   Smoking status: Never   Smokeless tobacco: Never  Vaping Use   Vaping status: Never Used  Substance and Sexual Activity   Alcohol use: Yes    Comment: occ   Drug use: No   Sexual activity: Not on file  Other Topics Concern   Not on file  Social History Narrative   Not on file   Social Drivers of Health   Financial Resource Strain: Low Risk  (10/24/2022)   Overall Financial Resource Strain (CARDIA)    Difficulty of Paying Living Expenses: Not hard at all  Food Insecurity: No Food Insecurity (10/24/2022)   Hunger Vital Sign    Worried About Running Out of Food in the Last Year: Never true    Ran Out of Food in the Last Year: Never true  Transportation Needs: No Transportation Needs (10/24/2022)   PRAPARE - Transportation  Lack of Transportation (Medical): No    Lack of Transportation (Non-Medical): No  Physical Activity: Unknown (10/24/2022)   Exercise Vital Sign    Days of Exercise per Week: 0 days    Minutes of Exercise per Session: Not on file  Stress: No Stress Concern Present (10/24/2022)   [employer] of Occupational Health - Occupational Stress Questionnaire    Feeling of Stress : Only a little  Social Connections: Unknown (10/24/2022)   Social Connection and Isolation Panel [NHANES]    Frequency of Communication with Friends and Family: More than three times a week    Frequency of Social Gatherings with Friends and Family: Once a week    Attends Religious Services: Patient declined    Automotive engineer or Organizations: No    Attends Engineer, structural: Not on file    Marital Status: Married     Family History: The patient's family history includes Cancer in her maternal grandfather; Cancer (age of onset: 80) in her father; Depression in her father and mother; Heart disease in her paternal grandmother; Hypertension in her father, mother, and paternal grandmother; Skin cancer in her father; Stroke in her maternal grandfather, maternal grandmother, paternal grandfather, and paternal grandmother.  ROS:   Please see the history of present illness.     All other systems reviewed and are negative.  EKGs/Labs/Other Studies Reviewed:    The following studies were reviewed today:       Recent Labs: 10/03/2022: TSH 2.83 03/14/2023: ALT 17; Hemoglobin 11.9; Platelets 365 05/05/2023: BUN 18; Creatinine, Ser 0.80; Potassium 4.4; Sodium 143  Recent Lipid Panel    Component Value Date/Time   CHOL 200 05/10/2022 0808   TRIG 94.0 05/10/2022 0808   HDL 54.50 05/10/2022 0808   CHOLHDL 4 05/10/2022 0808   VLDL 18.8 05/10/2022 0808   LDLCALC 127 (H) 05/10/2022 0808     Risk Assessment/Calculations:             Physical Exam:    VS:  BP 124/78 (BP Location: Left Arm, Patient Position: Sitting, Cuff Size: Large)   Pulse 76   Ht 5\' 4"  (1.626 m)   Wt 190 lb 3.2 oz (86.3 kg)   LMP  (LMP Unknown)   SpO2 98%   BMI 32.65 kg/m     Wt Readings from Last 3 Encounters:  07/11/23 190 lb 3.2 oz (86.3 kg)  06/29/23 193 lb 9.6 oz (87.8 kg)  05/05/23 187 lb 9.6 oz (85.1 kg)     GEN:  Well nourished, well developed in no acute distress HEENT: Normal NECK: No JVD; No carotid bruits CARDIAC: RRR, no murmurs, rubs, gallops RESPIRATORY:  Clear to auscultation without rales, wheezing or rhonchi  ABDOMEN: Soft, non-tender, non-distended MUSCULOSKELETAL:  No edema; No deformity  SKIN: Warm and dry NEUROLOGIC:  Alert and oriented x 3 PSYCHIATRIC:  Normal affect    ASSESSMENT:    1. Dyspnea on exertion   2. Pure hypercholesterolemia   3. Pain in both lower extremities   4. Primary hypertension     PLAN:    In order of problems listed above:  Dyspnea on exertion, coronary CTA 11/24 no CAD.  Echo 05/2023 normal EF 55 to 60%, impaired relaxation.  No cardiac findings to suggest etiology of dyspnea.  Possible etiology includes deconditioning, untreated OSA.  Previously referred to sleep specialist.  Exercise/conditioning advised. Hyperlipidemia, continue Lipitor 20 mg daily. Leg pain/thigh pain with walking.  Normal ABIs with no evidence for PAD.Marland Kitchen Hypertension,  BP controlled.  Continue Exforge 10-320 mg daily.  Follow-up as needed.     Medication Adjustments/Labs and Tests Ordered: Current medicines are reviewed at length with the patient today.  Concerns regarding medicines are outlined above.  No orders of the defined types were placed in this encounter.  No orders of the defined types were placed in this encounter.   Patient Instructions  Medication Instructions:  The current medical regimen is effective;  continue present plan and medications.  *If you need a refill on your cardiac medications before your next appointment, please call your pharmacy*   Follow-Up: At [facility], you and your health needs are our priority.  As part of our continuing mission to provide you with exceptional heart care, we have created designated Provider Care Teams.  These Care Teams include your primary Cardiologist (physician) and Advanced Practice Providers (APPs -  Physician Assistants and Nurse Practitioners) who all work together to provide you with the care you need, when you need it.  We recommend signing up for the patient portal called "MyChart".  Sign up information is provided on this After Visit Summary.  MyChart is used to connect with patients for Virtual Visits (Telemedicine).  Patients are able to view lab/test results, encounter  notes, upcoming appointments, etc.  Non-urgent messages can be sent to your provider as well.   To learn more about what you can do with MyChart, go to [email or web address].    Your next appointment:   As needed  Provider:   Debbe Odea, MD           Signed, Debbe Odea, MD  07/11/2023 9:23 AM    [facility]

## 2023-07-11 NOTE — Patient Instructions (Signed)
Medication Instructions:  The current medical regimen is effective;  continue present plan and medications.  *If you need a refill on your cardiac medications before your next appointment, please call your pharmacy*   Follow-Up: At [facility], you and your health needs are our priority.  As part of our continuing mission to provide you with exceptional heart care, we have created designated Provider Care Teams.  These Care Teams include your primary Cardiologist (physician) and Advanced Practice Providers (APPs -  Physician Assistants and Nurse Practitioners) who all work together to provide you with the care you need, when you need it.  We recommend signing up for the patient portal called "MyChart".  Sign up information is provided on this After Visit Summary.  MyChart is used to connect with patients for Virtual Visits (Telemedicine).  Patients are able to view lab/test results, encounter notes, upcoming appointments, etc.  Non-urgent messages can be sent to your provider as well.   To learn more about what you can do with MyChart, go to [email or web address].    Your next appointment:   As needed  Provider:   Debbe Odea, MD

## 2023-07-19 DIAGNOSIS — F331 Major depressive disorder, recurrent, moderate: Secondary | ICD-10-CM | POA: Diagnosis not present

## 2023-07-20 ENCOUNTER — Ambulatory Visit: Payer: BC Managed Care – PPO

## 2023-07-20 DIAGNOSIS — G4733 Obstructive sleep apnea (adult) (pediatric): Secondary | ICD-10-CM

## 2023-07-20 DIAGNOSIS — G4730 Sleep apnea, unspecified: Secondary | ICD-10-CM | POA: Diagnosis not present

## 2023-07-25 DIAGNOSIS — Z1231 Encounter for screening mammogram for malignant neoplasm of breast: Secondary | ICD-10-CM | POA: Diagnosis not present

## 2023-07-25 LAB — HM MAMMOGRAPHY

## 2023-07-31 ENCOUNTER — Ambulatory Visit: Payer: BC Managed Care – PPO | Admitting: Internal Medicine

## 2023-08-01 ENCOUNTER — Encounter: Payer: Self-pay | Admitting: Family Medicine

## 2023-08-02 ENCOUNTER — Ambulatory Visit: Payer: BC Managed Care – PPO | Admitting: Internal Medicine

## 2023-08-07 ENCOUNTER — Telehealth: Payer: BC Managed Care – PPO

## 2023-08-08 DIAGNOSIS — D485 Neoplasm of uncertain behavior of skin: Secondary | ICD-10-CM | POA: Diagnosis not present

## 2023-08-08 DIAGNOSIS — Z85820 Personal history of malignant melanoma of skin: Secondary | ICD-10-CM | POA: Diagnosis not present

## 2023-08-08 DIAGNOSIS — D235 Other benign neoplasm of skin of trunk: Secondary | ICD-10-CM | POA: Diagnosis not present

## 2023-08-08 DIAGNOSIS — Z08 Encounter for follow-up examination after completed treatment for malignant neoplasm: Secondary | ICD-10-CM | POA: Diagnosis not present

## 2023-08-08 DIAGNOSIS — Z85828 Personal history of other malignant neoplasm of skin: Secondary | ICD-10-CM | POA: Diagnosis not present

## 2023-08-08 DIAGNOSIS — D2271 Melanocytic nevi of right lower limb, including hip: Secondary | ICD-10-CM | POA: Diagnosis not present

## 2023-08-09 ENCOUNTER — Ambulatory Visit: Payer: BC Managed Care – PPO | Admitting: Family Medicine

## 2023-08-10 ENCOUNTER — Encounter: Payer: Self-pay | Admitting: Internal Medicine

## 2023-08-10 ENCOUNTER — Ambulatory Visit: Payer: BC Managed Care – PPO | Admitting: Family Medicine

## 2023-08-10 NOTE — Telephone Encounter (Signed)
 It is waiting for Dr. Roxy Cedar signature

## 2023-08-11 ENCOUNTER — Other Ambulatory Visit: Payer: Self-pay

## 2023-08-11 ENCOUNTER — Encounter: Payer: Self-pay | Admitting: Family Medicine

## 2023-08-11 ENCOUNTER — Encounter: Payer: Self-pay | Admitting: Cardiology

## 2023-08-11 ENCOUNTER — Ambulatory Visit: Payer: BC Managed Care – PPO | Admitting: Family Medicine

## 2023-08-11 VITALS — BP 120/80 | HR 70 | Temp 97.4°F | Ht 64.0 in | Wt 192.2 lb

## 2023-08-11 DIAGNOSIS — M25562 Pain in left knee: Secondary | ICD-10-CM | POA: Diagnosis not present

## 2023-08-11 DIAGNOSIS — G4733 Obstructive sleep apnea (adult) (pediatric): Secondary | ICD-10-CM | POA: Diagnosis not present

## 2023-08-11 MED ORDER — ZEPBOUND 2.5 MG/0.5ML ~~LOC~~ SOAJ
2.5000 mg | SUBCUTANEOUS | 0 refills | Status: AC
  Filled 2023-08-11: qty 2, 28d supply, fill #0

## 2023-08-11 MED ORDER — METOPROLOL SUCCINATE ER 100 MG PO TB24: 100.0000 mg | ORAL_TABLET | Freq: Every day | ORAL | 1 refills | Status: DC

## 2023-08-11 MED ORDER — PREDNISONE 20 MG PO TABS: ORAL_TABLET | ORAL | 0 refills | Status: DC

## 2023-08-11 MED ORDER — ZEPBOUND 5 MG/0.5ML ~~LOC~~ SOAJ
5.0000 mg | SUBCUTANEOUS | 0 refills | Status: DC
  Filled 2023-08-11: qty 2, 28d supply, fill #0

## 2023-08-11 MED ORDER — ZEPBOUND 5 MG/0.5ML ~~LOC~~ SOAJ: 5.0000 mg | SUBCUTANEOUS | 0 refills | Status: DC

## 2023-08-11 MED ORDER — AMLODIPINE BESYLATE-VALSARTAN 10-320 MG PO TABS: 1.0000 | ORAL_TABLET | Freq: Every day | ORAL | 1 refills | Status: DC

## 2023-08-11 MED ORDER — ZEPBOUND 2.5 MG/0.5ML ~~LOC~~ SOAJ: 2.5000 mg | SUBCUTANEOUS | 0 refills | Status: DC

## 2023-08-11 NOTE — Assessment & Plan Note (Addendum)
 Acute flare of now chronic pain.  No new injury known.  Pain now ongoing intermittently and more consistently now since January of last year approximately 1 year ago. X-ray in January 2024 showed no evidence of osteoarthritis but there was a joint effusion. Knee evaluation in office today shows no clear evidence of meniscal tear but had focal tenderness to palpation over the medial collateral ligament. Will start home physical therapy for medial collateral ligament, holds Excedrin as could be causing GI upset.  NSAIDs contraindicated given chronic GERD and past gastritis history. Will treat with prednisone taper over 10 days at lower dose. She can apply diclofenac gel 4 times a day as needed for inflammation as well as pain. Ice and limited repetitive bending, squatting etc.  She will follow-up in 2 weeks for reevaluation and consideration of referral versus MRI exam evaluation.

## 2023-08-11 NOTE — Telephone Encounter (Signed)
 I have sent secure chat message to Dr. Maple Hudson asking him to review and sign

## 2023-08-11 NOTE — Patient Instructions (Addendum)
 Start prednisone taper 40 mg x 5  days then 20 mg x 5 days.  Stop  Excedrin.  Apply topical voltaren gel 4 times a day.  Start home physical therapy  rehab for collateral ligament.

## 2023-08-11 NOTE — Telephone Encounter (Signed)
 I just sent today a secure message to Dr. Maple Hudson to review and sign no response from him

## 2023-08-11 NOTE — Progress Notes (Signed)
 Patient ID: Brandi Terrell, female    DOB: 11/15/1965, 58 y.o.   MRN: [id number]  This visit was conducted in person.  BP 120/80 (BP Location: Left Arm, Patient Position: Sitting, Cuff Size: Large)   Pulse 70   Temp (!) 97.4 F (36.3 C) (Temporal)   Ht 5\' 4"  (1.626 m)   Wt 192 lb 4 oz (87.2 kg)   LMP  (LMP Unknown)   SpO2 99%   BMI 33.00 kg/m    CC:  Chief Complaint  Patient presents with   Knee Pain    Left   ULQ Pain    Under Left Breast   Nausea   Shoulder Pain    Left    Subjective:   HPI: Brandi Terrell is a 58 y.o. female presenting on 08/11/2023 for Knee Pain (Left), ULQ Pain (Under Left Breast), Nausea, and Shoulder Pain (Left)   Left knee pain...  Noted similar in 06/2022... had some improvement but still intermittent Tried diclofenac gel QID.  X-ray 06/2022 no OA, had small effusion.  Now in last 2.5 months she has noted  more constant pain.  Pain standing up, getting out of bed, walking up stairs.  In last month she has noted radiation of pain in anterior leg.  Pain occurring 3-4 days a week.  No redness, heat.  notes intermittent swelling  Pain is primarily medial knee.   No weakness or numbness in left leg.  No catching, does feel knee not reliable.  No popping, no clicking.   No recent injury, no change in activity     She has tried tylenol or Excedrin off and on with minimal relief.  No  improvement with topical gel.  Has history of chronic GERD and gastritis possibly.  Relevant past medical, surgical, family and social history reviewed and updated as indicated. Interim medical history since our last visit reviewed. Allergies and medications reviewed and updated. Outpatient Medications Prior to Visit  Medication Sig Dispense Refill   atorvastatin (LIPITOR) 20 MG tablet Take 1 tablet (20 mg total) by mouth daily. 90 tablet 3   lamoTRIgine (LAMICTAL) 200 MG tablet Take 200 mg by mouth daily.     Magnesium Citrate 200 MG TABS Take 200 mg by  mouth daily.     omeprazole (PRILOSEC) 40 MG capsule Take 1 capsule (40 mg total) by mouth daily. 90 capsule 2   zolpidem (AMBIEN) 10 MG tablet Take 10 mg by mouth at bedtime.     amLODipine-valsartan (EXFORGE) 10-320 MG tablet Take 1 tablet by mouth daily.     metoprolol succinate (TOPROL-XL) 100 MG 24 hr tablet Take 1 tablet (100 mg total) by mouth daily. Take with or immediately following a meal. 90 tablet 1   zolpidem (AMBIEN) 5 MG tablet Take 5 mg by mouth at bedtime.     No facility-administered medications prior to visit.     Per HPI unless specifically indicated in ROS section below Review of Systems  Constitutional:  Negative for fatigue and fever.  HENT:  Negative for congestion.   Eyes:  Negative for pain.  Respiratory:  Negative for cough and shortness of breath.   Cardiovascular:  Negative for chest pain, palpitations and leg swelling.  Gastrointestinal:  Negative for abdominal pain.  Genitourinary:  Negative for dysuria and vaginal bleeding.  Musculoskeletal:  Negative for back pain.  Neurological:  Negative for syncope, light-headedness and headaches.  Psychiatric/Behavioral:  Negative for dysphoric mood.    Objective:  BP 120/80 (  BP Location: Left Arm, Patient Position: Sitting, Cuff Size: Large)   Pulse 70   Temp (!) 97.4 F (36.3 C) (Temporal)   Ht 5\' 4"  (1.626 m)   Wt 192 lb 4 oz (87.2 kg)   LMP  (LMP Unknown)   SpO2 99%   BMI 33.00 kg/m   Wt Readings from Last 3 Encounters:  08/11/23 192 lb 4 oz (87.2 kg)  07/11/23 190 lb 3.2 oz (86.3 kg)  06/29/23 193 lb 9.6 oz (87.8 kg)      Physical Exam Constitutional:      General: She is not in acute distress.    Appearance: Normal appearance. She is well-developed. She is not ill-appearing or toxic-appearing.  HENT:     Head: Normocephalic.     Right Ear: Hearing, tympanic membrane, ear canal and external ear normal. Tympanic membrane is not erythematous, retracted or bulging.     Left Ear: Hearing, tympanic  membrane, ear canal and external ear normal. Tympanic membrane is not erythematous, retracted or bulging.     Nose: No mucosal edema or rhinorrhea.     Right Sinus: No maxillary sinus tenderness or frontal sinus tenderness.     Left Sinus: No maxillary sinus tenderness or frontal sinus tenderness.     Mouth/Throat:     Pharynx: Uvula midline.  Eyes:     General: Lids are normal. Lids are everted, no foreign bodies appreciated.     Conjunctiva/sclera: Conjunctivae normal.     Pupils: Pupils are equal, round, and reactive to light.  Neck:     Thyroid: No thyroid mass or thyromegaly.     Vascular: No carotid bruit.     Trachea: Trachea normal.  Cardiovascular:     Rate and Rhythm: Normal rate and regular rhythm.     Pulses: Normal pulses.     Heart sounds: Normal heart sounds, S1 normal and S2 normal. No murmur heard.    No friction rub. No gallop.  Pulmonary:     Effort: Pulmonary effort is normal. No tachypnea or respiratory distress.     Breath sounds: Normal breath sounds. No decreased breath sounds, wheezing, rhonchi or rales.  Abdominal:     General: Bowel sounds are normal.     Palpations: Abdomen is soft.     Tenderness: There is no abdominal tenderness.  Musculoskeletal:     Cervical back: Normal range of motion and neck supple.     Left knee: Swelling and crepitus present. No deformity, effusion, erythema, ecchymosis or bony tenderness. Decreased range of motion. Tenderness present over the MCL. No medial joint line, LCL, ACL, PCL or patellar tendon tenderness. No LCL laxity, MCL laxity, ACL laxity or PCL laxity.Normal alignment, normal meniscus and normal patellar mobility. Normal pulse.  Skin:    General: Skin is warm and dry.     Findings: No rash.  Neurological:     Mental Status: She is alert.  Psychiatric:        Mood and Affect: Mood is not anxious or depressed.        Speech: Speech normal.        Behavior: Behavior normal. Behavior is cooperative.        Thought  Content: Thought content normal.        Judgment: Judgment normal.       Results for orders placed or performed in visit on 08/01/23  [facility] MAMMOGRAP[facility]   Collection Time: 07/25/23  9:48 AM  Result Value Ref Range   [facility] Mammogram 0-4  Bi-Rad 0-4 Bi-Rad, Self Reported Normal    Assessment and Plan  Acute pain of left knee Assessment & Plan:  Acute flare of now chronic pain.  No new injury known.  Pain now ongoing intermittently and more consistently now since January of last year approximately 1 year ago. X-ray in January 2024 showed no evidence of osteoarthritis but there was a joint effusion. Knee evaluation in office today shows no clear evidence of meniscal tear but had focal tenderness to palpation over the medial collateral ligament. Will start home physical therapy for medial collateral ligament, holds Excedrin as could be causing GI upset.  NSAIDs contraindicated given chronic GERD and past gastritis history. Will treat with prednisone taper over 10 days at lower dose. She can apply diclofenac gel 4 times a day as needed for inflammation as well as pain. Ice and limited repetitive bending, squatting etc.  She will follow-up in 2 weeks for reevaluation and consideration of referral versus MRI exam evaluation.   Other orders -     amLODIPine Besylate-Valsartan; Take 1 tablet by mouth daily.  Dispense: 90 tablet; Refill: 1 -     Metoprolol Succinate ER; Take 1 tablet (100 mg total) by mouth daily. Take with or immediately following a meal.  Dispense: 90 tablet; Refill: 1 -     predniSONE; 2 tabs by mouth daily x 5 days, then 1 tabs by mouth daily x 5 days  then stop  Dispense: 15 tablet; Refill: 0    Return in about 2 weeks (around 08/25/2023) for  follow up knee pain.Kerby Nora, MD

## 2023-08-11 NOTE — Telephone Encounter (Signed)
 Can you see if we have the results back yet?

## 2023-08-15 NOTE — Telephone Encounter (Signed)
 I has been signed on 08/11/23 but still not scanned into Epic

## 2023-08-19 ENCOUNTER — Ambulatory Visit
Admission: RE | Admit: 2023-08-19 | Discharge: 2023-08-19 | Disposition: A | Discharge status: Home / Self Care | Payer: BC Managed Care – PPO | Source: Ambulatory Visit | Attending: Physician Assistant | Admitting: Physician Assistant

## 2023-08-19 VITALS — BP 122/84 | HR 78 | Temp 99.1°F

## 2023-08-19 DIAGNOSIS — T148XXA Other injury of unspecified body region, initial encounter: Secondary | ICD-10-CM | POA: Diagnosis not present

## 2023-08-19 MED ORDER — MUPIROCIN 2 % EX OINT: 1.0000 | TOPICAL_OINTMENT | Freq: Two times a day (BID) | CUTANEOUS | 0 refills | Status: DC

## 2023-08-19 NOTE — ED Triage Notes (Signed)
 Pt c/o mole shave bx 11 days ago on upper abd. She states she has redness around the site and it is tender. She has been keeping area clean with soap and water.

## 2023-08-19 NOTE — ED Provider Notes (Signed)
 [facility]    CSN: [id number] Arrival date & time: 08/19/23  4098      History   Chief Complaint Chief Complaint  Patient presents with   Abscess    Mole on stomach was biopsied approximately 10 days ago. Still having pain and red ring around biopsy site. Would like to have it looked at to see if it is possibly infected. - Entered by patient   Appointment    HPI Brandi Terrell is a 58 y.o. female presenting for evaluation of a skin ulceration on her right abdomen that has been present for the past 10 to 11 days.  She had a shave biopsy of a skin lesion.  Has noticed a red ring around the wound since.  States it still is a little tender and she wants to see if it could potentially be infected.  Denies any associated swelling, pustular drainage or significant discomfort.  No fever.  No other complaints.  HPI  Past Medical History:  Diagnosis Date   Acid reflux    Basal cell carcinoma (BCC) of face    Depression    Hypertension    Inflammatory polyps of colon (HCC)    Malignant melanoma of skin of chest [facility])    Sleep apnea     Patient Active Problem List   Diagnosis Date Noted   Class 1 obesity due to excess calories with serious comorbidity and body mass index (BMI) of 33.0 to 33.9 in adult 04/26/2023   Epigastric pain 03/09/2023   Malignant melanoma (HCC) 02/02/2023   Trapezius strain, right, initial encounter 10/25/2022   Mild cognitive impairment 07/22/2022   Acute pain of left knee 06/23/2022   Forehead trauma 06/23/2022   Abnormal serum thyroxine (T4) level 05/20/2022   Grade I internal hemorrhoids 02/17/2022   Aphasia 12/31/2021   Poor balance 12/31/2021   Abnormal mammogram 12/31/2021   External hemorrhoids 03/23/2021   Elevated LFTs 02/25/2021   Hypercholesterolemia 04/26/2019   History of basal cell cancer 01/26/2017   GAD (generalized anxiety disorder) 01/26/2017   Chronic insomnia 01/26/2017   Hypertension 01/26/2017   Chronic GERD  01/26/2017   Family history of colon cancer 01/26/2017   Prediabetes 01/26/2017   Perineal pain in female, chronic 01/26/2017   Moderate recurrent major depression (HCC) 11/03/2016   B12 deficiency 10/24/2016   Obstructive sleep apnea 11/02/2015   H/O adenomatous polyp of colon 11/02/2015   Basal cell carcinoma of skin of other parts of face 06/24/2014    Past Surgical History:  Procedure Laterality Date   APPENDECTOMY  1976   CHOLECYSTECTOMY  2005    OB History   No obstetric history on file.      Home Medications    Prior to Admission medications   Medication Sig Start Date End Date Taking? Authorizing Provider  mupirocin ointment (BACTROBAN) 2 % Apply 1 Application topically 2 (two) times daily. 08/19/23  Yes Eusebio Friendly B, PA-C  amLODipine-valsartan (EXFORGE) 10-320 MG tablet Take 1 tablet by mouth daily. 08/11/23   Bedsole, Amy E, MD  atorvastatin (LIPITOR) 20 MG tablet Take 1 tablet (20 mg total) by mouth daily. 05/05/23   Debbe Odea, MD  lamoTRIgine (LAMICTAL) 200 MG tablet Take 200 mg by mouth daily. 03/14/22   [provider]  Magnesium Citrate 200 MG TABS Take 200 mg by mouth daily.    [provider]  metoprolol succinate (TOPROL-XL) 100 MG 24 hr tablet Take 1 tablet (100 mg total) by mouth daily. Take  with or immediately following a meal. 08/11/23   Bedsole, Amy E, MD  omeprazole (PRILOSEC) 40 MG capsule Take 1 capsule (40 mg total) by mouth daily. 07/05/23   Bedsole, Amy E, MD  predniSONE (DELTASONE) 20 MG tablet 2 tabs by mouth daily x 5 days, then 1 tabs by mouth daily x 5 days  then stop 08/11/23   Bedsole, Amy E, MD  tirzepatide (ZEPBOUND) 2.5 MG/0.5ML Pen Inject 2.5 mg into the skin once a week for 28 days. 08/11/23 09/08/23  Debbe Odea, MD  tirzepatide (ZEPBOUND) 5 MG/0.5ML Pen Inject 5 mg into the skin once a week. 09/09/23   Debbe Odea, MD  zolpidem (AMBIEN) 10 MG tablet Take 10 mg by mouth at bedtime. 07/31/23   [provider]    Family History Family History  Problem Relation Age of Onset   Hypertension Mother    Depression Mother    Skin cancer Father    Hypertension Father    Depression Father    Cancer Father 32        colon   Stroke Maternal Grandmother    Stroke Paternal Grandmother    Heart disease Paternal Grandmother    Hypertension Paternal Grandmother    Cancer Maternal Grandfather        leukemia   Stroke Maternal Grandfather    Stroke Paternal Grandfather     Social History Social History   Tobacco Use   Smoking status: Never   Smokeless tobacco: Never  Vaping Use   Vaping status: Never Used  Substance Use Topics   Alcohol use: Yes    Comment: occ   Drug use: No     Allergies   Sulfa antibiotics, Levofloxacin, Tramadol, Mirabegron, and Cephalosporins   Review of Systems Review of Systems  Constitutional:  Negative for fatigue and fever.  Skin:  Positive for color change and wound.  Neurological:  Negative for weakness.     Physical Exam Triage Vital Signs ED Triage Vitals  Encounter Vitals Group     BP 08/19/23 1000 122/84     Systolic BP Percentile --      Diastolic BP Percentile --      Pulse Rate 08/19/23 1000 78     Resp --      Temp 08/19/23 1000 99.1 F (37.3 C)     Temp Source 08/19/23 1000 Oral     SpO2 08/19/23 1000 (!) 78 %     Weight --      Height --      Head Circumference --      Peak Flow --      Pain Score 08/19/23 0959 3     Pain Loc --      Pain Education --      Exclude from Growth Chart --    No data found.  Updated Vital Signs BP 122/84 (BP Location: Right Arm)   Pulse 78   Temp 99.1 F (37.3 C) (Oral)   LMP  (LMP Unknown)   SpO2 94%      Physical Exam Vitals and nursing note reviewed.  Constitutional:      General: She is not in acute distress.    Appearance: Normal appearance. She is not ill-appearing or toxic-appearing.  HENT:     Head: Normocephalic and atraumatic.  Eyes:     General: No scleral  icterus.       Right eye: No discharge.        Left eye: No discharge.  Conjunctiva/sclera: Conjunctivae normal.  Cardiovascular:     Rate and Rhythm: Normal rate.  Pulmonary:     Effort: Pulmonary effort is normal. No respiratory distress.  Musculoskeletal:     Cervical back: Neck supple.  Skin:    General: Skin is dry.     Comments: See image included in chart.  There is a superficial skin ulceration of the right abdominal wall.  Pinkish healing tissue surrounding wound.  Area slightly tender to touch.  No significant erythema or swelling.  No drainage.  Neurological:     General: No focal deficit present.     Mental Status: She is alert. Mental status is at baseline.     Motor: No weakness.     Gait: Gait normal.  Psychiatric:        Mood and Affect: Mood normal.        Behavior: Behavior normal.      UC Treatments / Results  Labs (all labs ordered are listed, but only abnormal results are displayed) Labs Reviewed - No data to display  EKG   Radiology No results found.  Procedures Procedures (including critical care time)  Medications Ordered in UC Medications - No data to display  Initial Impression / Assessment and Plan / UC Course  I have reviewed the triage vital signs and the nursing notes.  Pertinent labs & imaging results that were available during my care of the patient were reviewed by me and considered in my medical decision making (see chart for details).   58 year old female presents for evaluation of wound of skin of abdomen post shave biopsy of a mole.  Reports surrounding redness and concern for infection.  Also reports continued mild tenderness.  Vitals are all stable and normal.  She is overall well-appearing.  See image included in chart.  Appears to be a healing postsurgical wound.  No sign of infection.  Sent mupirocin ointment.  Discussed wound care guidelines and supportive care.  Reviewed signs of infection and advised to return as  needed.   Final Clinical Impressions(s) / UC Diagnoses   Final diagnoses:  Skin wound from surgical incision     Discharge Instructions      -No sign of infection.  You have some scarring and ulceration but this is related to the wound healing.  Continue to clean with soap and water.  Apply the antibiotic ointment. - If you notice increased redness, worsening pain, swelling, pustular drainage follow-up for reevaluation but this may take a couple weeks to fully heal.     ED Prescriptions     Medication Sig Dispense Auth. Provider   mupirocin ointment (BACTROBAN) 2 % Apply 1 Application topically 2 (two) times daily. 22 g Shirlee Latch, PA-C      PDMP not reviewed this encounter.   Shirlee Latch, PA-C 08/19/23 1138

## 2023-08-19 NOTE — Discharge Instructions (Addendum)
-  No sign of infection.  You have some scarring and ulceration but this is related to the wound healing.  Continue to clean with soap and water.  Apply the antibiotic ointment. - If you notice increased redness, worsening pain, swelling, pustular drainage follow-up for reevaluation but this may take a couple weeks to fully heal.

## 2023-08-23 ENCOUNTER — Ambulatory Visit: Payer: BC Managed Care – PPO | Admitting: Internal Medicine

## 2023-08-23 ENCOUNTER — Encounter: Payer: Self-pay | Admitting: Internal Medicine

## 2023-08-23 ENCOUNTER — Other Ambulatory Visit: Payer: Self-pay

## 2023-08-23 VITALS — BP 118/70 | HR 72 | Temp 97.7°F | Ht 64.0 in | Wt 193.6 lb

## 2023-08-23 DIAGNOSIS — G4733 Obstructive sleep apnea (adult) (pediatric): Secondary | ICD-10-CM

## 2023-08-23 NOTE — Patient Instructions (Addendum)
 Referral to DME company [id number] MASK AUTO CPAP 4-10 cm h20 Please look into dental device as well  Avoid Allergens and Irritants Avoid secondhand smoke Avoid SICK contacts Recommend  Masking  when appropriate Recommend Keep up-to-date with vaccinations   Be aware of reduced alertness and do not drive or operate heavy machinery if experiencing this or drowsiness.  Exercise encouraged, as tolerated. Encouraged proper weight management.  Important to get eight or more hours of sleep  Limiting the use of the computer and television before bedtime.  Decrease naps during the day, so night time sleep will become enhanced.  Limit caffeine, and sleep deprivation.  HTN, stroke, uncontrolled diabetes and heart failure are potential risk factors.  Risk of untreated sleep apnea including cardiac arrhthymias, stroke, DM, pulm HTN.

## 2023-08-23 NOTE — Progress Notes (Signed)
 [facility] [place] Pulmonary Medicine Consultation      Date: 08/23/2023,   MRN# [id number] Brandi Terrell 04/24/1966    Synopsis:  patient seen for chronic SOB and DOE with voice changes and vocal cord weakness with recurrent bouts of URI's chronic vocal cord dysfunction Patient previously diagnosed with chronic vocal cord dysfunction Seems to have resolved was treated at [facility] Patient previous use of oxygen at nighttime but noncompliant with oxygen and CPAP therapy She has a history of grade 1 diastolic dysfunction based on echocardiogram Patient was diagnosed with pancreatitis Subsequently had a CT calcium score Patient states that she falls asleep subsequently bites her tongue   TESTS Previous sleep study 2019 HST showed severe sleep apnea AHI of 31 Patient attempted and tried CPAP machine however did not tolerated Patient did not follow-up Patient repeat HST July 20, 2023 shows AHI of 18 previously diagnosed with chronic vocal cord dysfunction   TESTS  CHIEF COMPLAINT:  Follow-up assessment for OSA   HISTORY OF PRESENT ILLNESS   Discussed sleep data and reviewed with patient.  Encouraged proper weight management.  Discussed driving precautions and its relationship with hypersomnolence.  Discussed operating dangerous equipment and its relationship with hypersomnolence.  Discussed sleep hygiene, and benefits of a fixed sleep waked time.  The importance of getting eight or more hours of sleep discussed with patient.  Discussed limiting the use of the computer and television before bedtime.  Decrease naps during the day, so night time sleep will become enhanced.  Limit caffeine, and sleep deprivation.  HTN, stroke, and heart failure are potential risk factors.   Discussed risk of untreated sleep apnea including cardiac arrhthymias, stroke, DM, pulm HTN.    Previous sleep study 2019 HST showed severe sleep apnea AHI of 31 Patient attempted and tried CPAP machine however  did not tolerated Patient did not follow-up  Patient repeat HST July 20, 2023 shows AHI of 18 I have discussed multiple options with the patient Option #1 with inspire device patient does not want this Option #2 start therapy with rest and mask cradle Auto CPAP Option #3 obtain dental device and Test with HST   At this time, patient will proceed with option #2 which is to start CPAP therapy with nasal cradle In the meantime we will assess for option #3 with her dentist with repeat HST   No evidence of heart failure at this time No evidence or signs of infection at this time No respiratory distress No fevers, chills, nausea, vomiting, diarrhea No evidence of lower extremity edema No evidence hemoptysis    Current Medication:  Current Outpatient Medications:    amLODipine-valsartan (EXFORGE) 10-320 MG tablet, Take 1 tablet by mouth daily., Disp: 90 tablet, Rfl: 1   atorvastatin (LIPITOR) 20 MG tablet, Take 1 tablet (20 mg total) by mouth daily., Disp: 90 tablet, Rfl: 3   lamoTRIgine (LAMICTAL) 200 MG tablet, Take 200 mg by mouth daily., Disp: , Rfl:    Magnesium Citrate 200 MG TABS, Take 200 mg by mouth daily., Disp: , Rfl:    metoprolol succinate (TOPROL-XL) 100 MG 24 hr tablet, Take 1 tablet (100 mg total) by mouth daily. Take with or immediately following a meal., Disp: 90 tablet, Rfl: 1   mupirocin ointment (BACTROBAN) 2 %, Apply 1 Application topically 2 (two) times daily., Disp: 22 g, Rfl: 0   omeprazole (PRILOSEC) 40 MG capsule, Take 1 capsule (40 mg total) by mouth daily., Disp: 90 capsule, Rfl: 2   predniSONE (DELTASONE) 20  MG tablet, 2 tabs by mouth daily x 5 days, then 1 tabs by mouth daily x 5 days  then stop, Disp: 15 tablet, Rfl: 0   tirzepatide (ZEPBOUND) 2.5 MG/0.5ML Pen, Inject 2.5 mg into the skin once a week for 28 days., Disp: 2 mL, Rfl: 0   [START ON 09/09/2023] tirzepatide (ZEPBOUND) 5 MG/0.5ML Pen, Inject 5 mg into the skin once a week., Disp: 2 mL, Rfl: 0    zolpidem (AMBIEN) 10 MG tablet, Take 10 mg by mouth at bedtime., Disp: , Rfl:     ALLERGIES   Sulfa antibiotics, Levofloxacin, Tramadol, Mirabegron, and Cephalosporins    CT chest 01/21/16 images  reviewed No abnormal findings at this time   BP 118/70 (BP Location: Right Arm, Patient Position: Sitting, Cuff Size: Normal)   Pulse 72   Temp 97.7 F (36.5 C) (Temporal)   Ht 5\' 4"  (1.626 m)   Wt 193 lb 9.6 oz (87.8 kg)   LMP  (LMP Unknown)   SpO2 97%   BMI 33.23 kg/m     Review of Systems: Gen:  Denies  fever, sweats, chills weight loss  HEENT: Denies blurred vision, double vision, ear pain, eye pain, hearing loss, nose bleeds, sore throat Cardiac:  No dizziness, chest pain or heaviness, chest tightness,edema, No JVD Resp:   No cough, -sputum production, -shortness of breath,-wheezing, -hemoptysis,  Other:  All other systems negative   Physical Examination:   General Appearance: No distress  EYES PERRLA, EOM intact.   NECK Supple, No JVD Pulmonary: normal breath sounds, No wheezing.  CardiovascularNormal S1,S2.  No m/r/g.   Abdomen: Benign, Soft, non-tender. Neurology UE/LE 5/5 strength, no focal deficits Ext pulses intact, cap refill intact ALL OTHER ROS ARE NEGATIVE      ASSESSMENT/PLAN   58 year old obese white female seen today for follow-up assessment for severe sleep apnea previously diagnosed in 2019 with AHI of 31 in the setting of obesity and deconditioned state with underlying diastolic dysfunction with a previous history of vocal cord dysfunction with deconditioned state, also some symptoms of tongue biting while sleeping   Severe sleep apnea AHI of 31  Repeat home sleep study to assess diagnosis shows AHI of 18 Referral to DME company [employer] AIRFITN30[employer] MASK AUTO CPAP 4-12 cm h20  Be aware of reduced alertness and do not drive or operate heavy machinery if experiencing this or drowsiness.  Exercise encouraged, as tolerated. Encouraged  proper weight management.  Important to get eight or more hours of sleep  Limiting the use of the computer and television before bedtime.  Decrease naps during the day, so night time sleep will become enhanced.  Limit caffeine, and sleep deprivation.  HTN, stroke, uncontrolled diabetes and heart failure are potential risk factors.  Risk of untreated sleep apnea including cardiac arrhthymias, stroke, DM, pulm HTN.    Obesity -recommend significant weight loss -recommend changing diet  Deconditioned state -Recommend increased daily activity and exercise   Neurology consultation for tongue biting Patient has seen Dr. Sherryll Burger neurology follow-up as scheduled   At this time her vocal cord dysfunction has completely resolved No further work-up at this time Previous pulmonary function testing was within normal limits     MEDICATION ADJUSTMENTS/LABS AND TESTS ORDERED: Referral to DME company [employer] AIRFITN30[employer] MASK AUTO CPAP 4-12 cm h20 Patient is still living with to obtain an oral device as well Avoid Allergens and Irritants Avoid secondhand smoke Avoid SICK contacts Recommend  Masking  when appropriate  Recommend Keep up-to-date with vaccinations   CURRENT MEDICATIONS REVIEWED AT LENGTH WITH PATIENT TODAY   Patient  satisfied with Plan of action and management. All questions answered   Follow up 3 months   I spent a total of 43 minutes reviewing chart data, face-to-face evaluation with the patient, counseling and coordination of care as detailed above.      Lucie Leather, M.D.  [facility]  Medical Director [facility] Medical Director [facility] Cardio-Pulmonary Department

## 2023-08-25 ENCOUNTER — Ambulatory Visit: Payer: BC Managed Care – PPO | Admitting: Family Medicine

## 2023-09-11 DIAGNOSIS — H2513 Age-related nuclear cataract, bilateral: Secondary | ICD-10-CM | POA: Diagnosis not present

## 2023-09-11 DIAGNOSIS — H40003 Preglaucoma, unspecified, bilateral: Secondary | ICD-10-CM | POA: Diagnosis not present

## 2023-09-11 DIAGNOSIS — H1013 Acute atopic conjunctivitis, bilateral: Secondary | ICD-10-CM | POA: Diagnosis not present

## 2023-09-11 DIAGNOSIS — H04123 Dry eye syndrome of bilateral lacrimal glands: Secondary | ICD-10-CM | POA: Diagnosis not present

## 2023-10-06 ENCOUNTER — Telehealth: Admitting: Nurse Practitioner

## 2023-10-06 DIAGNOSIS — B9689 Other specified bacterial agents as the cause of diseases classified elsewhere: Secondary | ICD-10-CM

## 2023-10-07 ENCOUNTER — Telehealth: Admitting: Nurse Practitioner

## 2023-10-07 DIAGNOSIS — H1013 Acute atopic conjunctivitis, bilateral: Secondary | ICD-10-CM

## 2023-10-07 MED ORDER — OLOPATADINE HCL 0.2 % OP SOLN: 1.0000 [drp] | Freq: Every day | OPHTHALMIC | 0 refills | Status: DC

## 2023-10-07 MED ORDER — POLYMYXIN B-TRIMETHOPRIM 10000-0.1 UNIT/ML-% OP SOLN: 1.0000 [drp] | OPHTHALMIC | 0 refills | Status: DC

## 2023-10-07 NOTE — Progress Notes (Signed)

## 2023-10-07 NOTE — Progress Notes (Signed)
 Virtual Visit Consent   Brandi Terrell, you are scheduled for a virtual visit with a Brandi Terrell provider today. Just as with appointments in the office, your consent must be obtained to participate. Your consent will be active for this visit and any virtual visit you may have with one of our providers in the next 365 days. If you have a MyChart account, a copy of this consent can be sent to you electronically.  As this is a virtual visit, video technology does not allow for your provider to perform a traditional examination. This may limit your provider's ability to fully assess your condition. If your provider identifies any concerns that need to be evaluated in person or the need to arrange testing (such as labs, EKG, etc.), we will make arrangements to do so. Although advances in technology are sophisticated, we cannot ensure that it will always work on either your end or our end. If the connection with a video visit is poor, the visit may have to be switched to a telephone visit. With either a video or telephone visit, we are not always able to ensure that we have a secure connection.  By engaging in this virtual visit, you consent to the provision of healthcare and authorize for your insurance to be billed (if applicable) for the services provided during this visit. Depending on your insurance coverage, you may receive a charge related to this service.  I need to obtain your verbal consent now. Are you willing to proceed with your visit today? Brandi Terrell has provided verbal consent on 10/07/2023 for a virtual visit (video or telephone). Brandi Dean, NP  Date: 10/07/2023 8:59 AM   Virtual Visit via Video Note   I, Brandi Terrell, connected with  Brandi Terrell  ([id number], June 28, 1965) on 10/07/23 at  8:45 AM EDT by a video-enabled telemedicine application and verified that I am speaking with the correct person using two identifiers.  Location: Patient: Virtual Visit Location  Patient: Home Provider: Virtual Visit Location Provider: Home Office   I discussed the limitations of evaluation and management by telemedicine and the availability of in person appointments. The patient expressed understanding and agreed to proceed.    History of Present Illness: Brandi Terrell is a 58 y.o. who identifies as a female who was assigned female at birth, and is being seen today for eye problem.  Brandi Terrell has been experiencing gritty sensation in eyes, waking up and feeling like her eyes are stuck together, a bump or thickness along the ridge of the inner eyelids, redness, irritation and itching over the past few months. She has been using systane lubricating drops (ointment and gels) and pataday  for a few days with little relief. States she was told she had early glaucoma by her optometrist as well. She has used antibiotic eyedrops in the past and symptoms returned.     Problems:  Patient Active Problem List   Diagnosis Date Noted   Class 1 obesity due to excess calories with serious comorbidity and body mass index (BMI) of 33.0 to 33.9 in adult 04/26/2023   Epigastric pain 03/09/2023   Malignant melanoma (HCC) 02/02/2023   Trapezius strain, right, initial encounter 10/25/2022   Mild cognitive impairment 07/22/2022   Acute pain of left knee 06/23/2022   Forehead trauma 06/23/2022   Abnormal serum thyroxine (T4) level 05/20/2022   Grade I internal hemorrhoids 02/17/2022   Aphasia 12/31/2021   Poor balance 12/31/2021   Abnormal mammogram 12/31/2021  External hemorrhoids 03/23/2021   Elevated LFTs 02/25/2021   Hypercholesterolemia 04/26/2019   History of basal cell cancer 01/26/2017   GAD (generalized anxiety disorder) 01/26/2017   Chronic insomnia 01/26/2017   Hypertension 01/26/2017   Chronic GERD 01/26/2017   Family history of colon cancer 01/26/2017   Prediabetes 01/26/2017   Perineal pain in female, chronic 01/26/2017   Moderate recurrent major depression  (HCC) 11/03/2016   B12 deficiency 10/24/2016   Obstructive sleep apnea 11/02/2015   H/O adenomatous polyp of colon 11/02/2015   Basal cell carcinoma of skin of other parts of face 06/24/2014    Allergies:  Allergies  Allergen Reactions   Sulfa Antibiotics Hives   Levofloxacin Diarrhea   Tramadol Hives   Mirabegron Other (See Comments)    Blurred vision   Cephalosporins Itching and Rash   Medications:  Current Outpatient Medications:    Olopatadine  HCl 0.2 % SOLN, Apply 1 drop to eye daily., Disp: 2.5 mL, Rfl: 0   amLODipine -valsartan  (EXFORGE ) 10-320 MG tablet, Take 1 tablet by mouth daily., Disp: 90 tablet, Rfl: 1   atorvastatin  (LIPITOR) 20 MG tablet, Take 1 tablet (20 mg total) by mouth daily., Disp: 90 tablet, Rfl: 3   lamoTRIgine  (LAMICTAL ) 200 MG tablet, Take 200 mg by mouth daily., Disp: , Rfl:    Magnesium Citrate 200 MG TABS, Take 200 mg by mouth daily., Disp: , Rfl:    metoprolol  succinate (TOPROL -XL) 100 MG 24 hr tablet, Take 1 tablet (100 mg total) by mouth daily. Take with or immediately following a meal., Disp: 90 tablet, Rfl: 1   mupirocin  ointment (BACTROBAN ) 2 %, Apply 1 Application topically 2 (two) times daily., Disp: 22 g, Rfl: 0   omeprazole  (PRILOSEC) 40 MG capsule, Take 1 capsule (40 mg total) by mouth daily., Disp: 90 capsule, Rfl: 2   tirzepatide  (ZEPBOUND ) 5 MG/0.5ML Pen, Inject 5 mg into the skin once a week., Disp: 2 mL, Rfl: 0   trimethoprim -polymyxin b  (POLYTRIM ) ophthalmic solution, Place 1 drop into both eyes every 4 (four) hours., Disp: 10 mL, Rfl: 0   zolpidem  (AMBIEN ) 10 MG tablet, Take 10 mg by mouth at bedtime., Disp: , Rfl:   Observations/Objective: Patient is well-developed, well-nourished in no acute distress.  Resting comfortably at home.  Head is normocephalic, atraumatic.  No labored breathing.  Speech is clear and coherent with logical content.  Patient is alert and oriented at baseline.    Assessment and Plan: 1. Allergic  conjunctivitis of both eyes (Primary) - Olopatadine  HCl 0.2 % SOLN; Apply 1 drop to eye daily.  Dispense: 2.5 mL; Refill: 0  Follow up with eye doctor if no improvement due to symptoms of blurred vision  Follow Up Instructions: I discussed the assessment and treatment plan with the patient. The patient was provided an opportunity to ask questions and all were answered. The patient agreed with the plan and demonstrated an understanding of the instructions.  A copy of instructions were sent to the patient via MyChart unless otherwise noted below.    The patient was advised to call back or seek an in-person evaluation if the symptoms worsen or if the condition fails to improve as anticipated.    Sahan Pen W Lonza Shimabukuro, NP

## 2023-10-07 NOTE — Progress Notes (Signed)
 I have spent 5 minutes in review of e-visit questionnaire, review and updating patient chart, medical decision making and response to patient.   Claiborne Rigg, NP

## 2023-10-07 NOTE — Patient Instructions (Signed)
  Ronda Cocks, thank you for joining [name], NP for today's virtual visit.  While this provider is not your primary care provider (PCP), if your PCP is located in our provider database this encounter information will be shared with them immediately following your visit.   A [facility] MyChart account gives you access to today's visit and all your visits, tests, and labs performed at [facility] " click here if you don't have a [facility] MyChart account or go to [email or web address]  Consent: (Patient) Ronda Cocks provided verbal consent for this virtual visit at the beginning of the encounter.  Current Medications:  Current Outpatient Medications:    Olopatadine  HCl 0.2 % SOLN, Apply 1 drop to eye daily., Disp: 2.5 mL, Rfl: 0   amLODipine -valsartan  (EXFORGE ) 10-320 MG tablet, Take 1 tablet by mouth daily., Disp: 90 tablet, Rfl: 1   atorvastatin  (LIPITOR) 20 MG tablet, Take 1 tablet (20 mg total) by mouth daily., Disp: 90 tablet, Rfl: 3   lamoTRIgine  (LAMICTAL ) 200 MG tablet, Take 200 mg by mouth daily., Disp: , Rfl:    Magnesium Citrate 200 MG TABS, Take 200 mg by mouth daily., Disp: , Rfl:    metoprolol  succinate (TOPROL -XL) 100 MG 24 hr tablet, Take 1 tablet (100 mg total) by mouth daily. Take with or immediately following a meal., Disp: 90 tablet, Rfl: 1   mupirocin  ointment (BACTROBAN ) 2 %, Apply 1 Application topically 2 (two) times daily., Disp: 22 g, Rfl: 0   omeprazole  (PRILOSEC) 40 MG capsule, Take 1 capsule (40 mg total) by mouth daily., Disp: 90 capsule, Rfl: 2   tirzepatide  (ZEPBOUND ) 5 MG/0.5ML Pen, Inject 5 mg into the skin once a week., Disp: 2 mL, Rfl: 0   trimethoprim -polymyxin b  (POLYTRIM ) ophthalmic solution, Place 1 drop into both eyes every 4 (four) hours., Disp: 10 mL, Rfl: 0   zolpidem  (AMBIEN ) 10 MG tablet, Take 10 mg by mouth at bedtime., Disp: , Rfl:    Medications ordered in this encounter:  Meds ordered this encounter   Medications   Olopatadine  HCl 0.2 % SOLN    Sig: Apply 1 drop to eye daily.    Dispense:  2.5 mL    Refill:  0    Supervising Provider:   Corine Dice [[id number]]     *If you need refills on other medications prior to your next appointment, please contact your pharmacy*  Follow-Up: Call back or seek an in-person evaluation if the symptoms worsen or if the condition fails to improve as anticipated.  [facility] [phone]  Other Instructions Follow up with eye doctor if no improvement due to symptoms of blurred vision   If you have been instructed to have an in-person evaluation today at a local Urgent Care facility, please use the link below. It will take you to a list of all of our available [facility], including address, phone number and hours of operation. Please do not delay care.  [facility]  If you or a family member do not have a primary care provider, use the link below to schedule a visit and establish care. When you choose a [facility] primary care physician or advanced practice provider, you gain a long-term partner in health. Find a Primary Care Provider  Learn more about 's in-office and virtual care options:  - Get Care Now

## 2023-10-10 ENCOUNTER — Ambulatory Visit: Admitting: Family Medicine

## 2023-10-18 DIAGNOSIS — G4733 Obstructive sleep apnea (adult) (pediatric): Secondary | ICD-10-CM | POA: Diagnosis not present

## 2023-11-08 ENCOUNTER — Ambulatory Visit: Admitting: Family Medicine

## 2023-11-17 ENCOUNTER — Ambulatory Visit: Admitting: Family Medicine

## 2023-11-17 DIAGNOSIS — G4733 Obstructive sleep apnea (adult) (pediatric): Secondary | ICD-10-CM | POA: Diagnosis not present

## 2023-11-20 ENCOUNTER — Encounter: Payer: Self-pay | Admitting: Family Medicine

## 2023-11-20 MED ORDER — METOPROLOL SUCCINATE ER 100 MG PO TB24: 100.0000 mg | ORAL_TABLET | Freq: Every day | ORAL | 0 refills | Status: DC

## 2023-11-22 ENCOUNTER — Encounter: Payer: Self-pay | Admitting: Family Medicine

## 2023-11-22 ENCOUNTER — Ambulatory Visit: Admitting: Family Medicine

## 2023-11-22 VITALS — BP 134/74 | HR 70 | Temp 99.0°F | Ht 64.0 in | Wt 193.4 lb

## 2023-11-22 DIAGNOSIS — R233 Spontaneous ecchymoses: Secondary | ICD-10-CM

## 2023-11-22 DIAGNOSIS — E7800 Pure hypercholesterolemia, unspecified: Secondary | ICD-10-CM | POA: Diagnosis not present

## 2023-11-22 DIAGNOSIS — I10 Essential (primary) hypertension: Secondary | ICD-10-CM

## 2023-11-22 DIAGNOSIS — E538 Deficiency of other specified B group vitamins: Secondary | ICD-10-CM

## 2023-11-22 DIAGNOSIS — E6609 Other obesity due to excess calories: Secondary | ICD-10-CM

## 2023-11-22 DIAGNOSIS — Z6833 Body mass index (BMI) 33.0-33.9, adult: Secondary | ICD-10-CM

## 2023-11-22 DIAGNOSIS — R7303 Prediabetes: Secondary | ICD-10-CM

## 2023-11-22 DIAGNOSIS — E66811 Obesity, class 1: Secondary | ICD-10-CM

## 2023-11-22 DIAGNOSIS — R8290 Unspecified abnormal findings in urine: Secondary | ICD-10-CM | POA: Diagnosis not present

## 2023-11-22 DIAGNOSIS — J9801 Acute bronchospasm: Secondary | ICD-10-CM | POA: Insufficient documentation

## 2023-11-22 LAB — POC URINALSYSI DIPSTICK (AUTOMATED)
Blood, UA: NEGATIVE
Glucose, UA: NEGATIVE
Ketones, UA: NEGATIVE
Leukocytes, UA: NEGATIVE
Nitrite, UA: NEGATIVE
Protein, UA: NEGATIVE
Spec Grav, UA: >=1.03 — AB (ref 1.010–1.025)
Urobilinogen, UA: 0.2 U/dL
pH, UA: 5.5 (ref 5.0–8.0)

## 2023-11-22 LAB — CBC WITH DIFFERENTIAL/PLATELET
Basophils Absolute: 0 10*3/uL (ref 0.0–0.1)
Basophils Relative: 0.4 % (ref 0.0–3.0)
Eosinophils Absolute: 0.1 10*3/uL (ref 0.0–0.7)
Eosinophils Relative: 1.5 % (ref 0.0–5.0)
HCT: 37.7 % (ref 36.0–46.0)
Hemoglobin: 12.2 g/dL (ref 12.0–15.0)
Lymphocytes Relative: 41.9 % (ref 12.0–46.0)
Lymphs Abs: 2.6 10*3/uL (ref 0.7–4.0)
MCHC: 32.3 g/dL (ref 30.0–36.0)
MCV: 82.9 fl (ref 78.0–100.0)
Monocytes Absolute: 0.4 10*3/uL (ref 0.1–1.0)
Monocytes Relative: 7.1 % (ref 3.0–12.0)
Neutro Abs: 3.1 10*3/uL (ref 1.4–7.7)
Neutrophils Relative %: 49.1 % (ref 43.0–77.0)
Platelets: 327 10*3/uL (ref 150.0–400.0)
RBC: 4.55 Mil/uL (ref 3.87–5.11)
RDW: 14.7 % (ref 11.5–15.5)
WBC: 6.3 10*3/uL (ref 4.0–10.5)

## 2023-11-22 LAB — COMPREHENSIVE METABOLIC PANEL WITH GFR
ALT: 21 U/L (ref 0–35)
AST: 20 U/L (ref 0–37)
Albumin: 4.5 g/dL (ref 3.5–5.2)
Alkaline Phosphatase: 132 U/L — ABNORMAL HIGH (ref 39–117)
BUN: 24 mg/dL — ABNORMAL HIGH (ref 6–23)
CO2: 29 meq/L (ref 19–32)
Calcium: 10.6 mg/dL — ABNORMAL HIGH (ref 8.4–10.5)
Chloride: 105 meq/L (ref 96–112)
Creatinine, Ser: 0.76 mg/dL (ref 0.40–1.20)
GFR: 85.28 mL/min (ref >60.00)
Glucose, Bld: 100 mg/dL — ABNORMAL HIGH (ref 70–99)
Potassium: 4.4 meq/L (ref 3.5–5.1)
Sodium: 140 meq/L (ref 135–145)
Total Bilirubin: 0.6 mg/dL (ref 0.2–1.2)
Total Protein: 7.1 g/dL (ref 6.0–8.3)

## 2023-11-22 LAB — VITAMIN B12: Vitamin B-12: 252 pg/mL (ref 211–911)

## 2023-11-22 LAB — LIPID PANEL
Cholesterol: 184 mg/dL (ref 0–200)
HDL: 51.3 mg/dL (ref >39.00)
LDL Cholesterol: 99 mg/dL (ref 0–99)
NonHDL: 132.87
Total CHOL/HDL Ratio: 4
Triglycerides: 169 mg/dL — ABNORMAL HIGH (ref 0.0–149.0)
VLDL: 33.8 mg/dL (ref 0.0–40.0)

## 2023-11-22 LAB — HEMOGLOBIN A1C: Hgb A1c MFr Bld: 6.3 % (ref 4.6–6.5)

## 2023-11-22 MED ORDER — BENZONATATE 200 MG PO CAPS: 200.0000 mg | ORAL_CAPSULE | Freq: Two times a day (BID) | ORAL | 0 refills | Status: DC | PRN

## 2023-11-22 NOTE — Assessment & Plan Note (Signed)
 Has not yet started Zepbound .. daughters wedding and personal illness came up.

## 2023-11-22 NOTE — Assessment & Plan Note (Addendum)
 Due for re-eval. Some of her recent symptoms are concerning for possible development of diabetes.  Will check urinalysis to determine if urine smell is due to infection or new diabetes.

## 2023-11-22 NOTE — Progress Notes (Signed)
 Patient ID: Brandi Terrell, female    DOB: March 24, 1966, 58 y.o.   MRN: [id number]  This visit was conducted in person.  BP 134/74   Pulse 70   Temp 99 F (37.2 C) (Oral)   Ht 5\' 4"  (1.626 m)   Wt 193 lb 6.4 oz (87.7 kg)   LMP  (LMP Unknown)   SpO2 97%   BMI 33.20 kg/m    CC:  Chief Complaint  Patient presents with   Diabetes    Pre-diabetes   chest     Patient said she was under the weather and would like her chest listened to. One day last week her temp did get up to 100    Subjective:   HPI: [name] is a 58 y.o. female presenting on 11/22/2023 for Diabetes (Pre-diabetes) and chest  (Patient said she was under the weather and would like her chest listened to. One day last week her temp did get up to 100)  She has noted smell  in urine, smells like popcorn in last few months  No urinary symptoms but increased frequency.  Has noted intermittent blurry vision.. mainly in morning..  has seen eye MD recently.  She is concerned about progression of urine.   In last 1.5 weeks had head cold, 3 days in had fever, chills and aches.  COVID negative x 2   Started feeling better but still persistent cough, coughing fits.  Feeling tired, still scratchy throat and runny nose.  Due for repeat labs. She  has not yet started the Zepbound . Wt Readings from Last 3 Encounters:  11/22/23 193 lb 6.4 oz (87.7 kg)  08/23/23 193 lb 9.6 oz (87.8 kg)  08/11/23 192 lb 4 oz (87.2 kg)   Hx of prediabetes Lab Results  Component Value Date   HGBA1C 6.1 05/10/2022    HTN well controlled on amlodipine /valsartan  10/320 mg 1 tablet daily BP Readings from Last 3 Encounters:  11/22/23 134/74  08/23/23 118/70  08/19/23 122/84         Relevant past medical, surgical, family and social history reviewed and updated as indicated. Interim medical history since our last visit reviewed. Allergies and medications reviewed and updated. Outpatient Medications Prior to Visit  Medication Sig  Dispense Refill   amLODipine -valsartan  (EXFORGE ) 10-320 MG tablet Take 1 tablet by mouth daily. 90 tablet 1   atorvastatin  (LIPITOR) 20 MG tablet Take 1 tablet (20 mg total) by mouth daily. 90 tablet 3   lamoTRIgine  (LAMICTAL ) 200 MG tablet Take 200 mg by mouth daily.     metoprolol  succinate (TOPROL -XL) 100 MG 24 hr tablet Take 1 tablet (100 mg total) by mouth daily. Take with or immediately following a meal. 90 tablet 0   omeprazole  (PRILOSEC) 40 MG capsule Take 1 capsule (40 mg total) by mouth daily. 90 capsule 2   zolpidem  (AMBIEN ) 10 MG tablet Take 10 mg by mouth at bedtime.     Magnesium Citrate 200 MG TABS Take 200 mg by mouth daily.     mupirocin  ointment (BACTROBAN ) 2 % Apply 1 Application topically 2 (two) times daily. 22 g 0   Olopatadine  HCl 0.2 % SOLN Apply 1 drop to eye daily. 2.5 mL 0   trimethoprim -polymyxin b  (POLYTRIM ) ophthalmic solution Place 1 drop into both eyes every 4 (four) hours. 10 mL 0   tirzepatide  (ZEPBOUND ) 5 MG/0.5ML Pen Inject 5 mg into the skin once a week. (Patient not taking: Reported on 11/22/2023) 2 mL 0  No facility-administered medications prior to visit.     Per HPI unless specifically indicated in ROS section below Review of Systems  Constitutional:  Negative for fatigue and fever.  HENT:  Negative for congestion.   Eyes:  Negative for pain.  Respiratory:  Negative for cough and shortness of breath.   Cardiovascular:  Negative for chest pain, palpitations and leg swelling.  Gastrointestinal:  Negative for abdominal pain.  Genitourinary:  Negative for dysuria and vaginal bleeding.  Musculoskeletal:  Negative for back pain.  Neurological:  Negative for syncope, light-headedness and headaches.  Psychiatric/Behavioral:  Negative for dysphoric mood.    Objective:  BP 134/74   Pulse 70   Temp 99 F (37.2 C) (Oral)   Ht 5\' 4"  (1.626 m)   Wt 193 lb 6.4 oz (87.7 kg)   LMP  (LMP Unknown)   SpO2 97%   BMI 33.20 kg/m   Wt Readings from Last 3  Encounters:  11/22/23 193 lb 6.4 oz (87.7 kg)  08/23/23 193 lb 9.6 oz (87.8 kg)  08/11/23 192 lb 4 oz (87.2 kg)      Physical Exam Constitutional:      General: She is not in acute distress.    Appearance: Normal appearance. She is well-developed. She is not ill-appearing or toxic-appearing.  HENT:     Head: Normocephalic.     Right Ear: Hearing, tympanic membrane, ear canal and external ear normal. Tympanic membrane is not erythematous, retracted or bulging.     Left Ear: Hearing, tympanic membrane, ear canal and external ear normal. Tympanic membrane is not erythematous, retracted or bulging.     Nose: No mucosal edema or rhinorrhea.     Right Sinus: No maxillary sinus tenderness or frontal sinus tenderness.     Left Sinus: No maxillary sinus tenderness or frontal sinus tenderness.     Mouth/Throat:     Pharynx: Uvula midline.  Eyes:     General: Lids are normal. Lids are everted, no foreign bodies appreciated.     Conjunctiva/sclera: Conjunctivae normal.     Pupils: Pupils are equal, round, and reactive to light.  Neck:     Thyroid : No thyroid  mass or thyromegaly.     Vascular: No carotid bruit.     Trachea: Trachea normal.  Cardiovascular:     Rate and Rhythm: Normal rate and regular rhythm.     Pulses: Normal pulses.     Heart sounds: Normal heart sounds, S1 normal and S2 normal. No murmur heard.    No friction rub. No gallop.  Pulmonary:     Effort: Pulmonary effort is normal. No tachypnea or respiratory distress.     Breath sounds: Normal breath sounds. No decreased breath sounds, wheezing, rhonchi or rales.  Abdominal:     General: Bowel sounds are normal.     Palpations: Abdomen is soft.     Tenderness: There is no abdominal tenderness.  Musculoskeletal:     Cervical back: Normal range of motion and neck supple.  Skin:    General: Skin is warm and dry.     Findings: No rash.  Neurological:     Mental Status: She is alert.  Psychiatric:        Mood and Affect:  Mood is not anxious or depressed.        Speech: Speech normal.        Behavior: Behavior normal. Behavior is cooperative.        Thought Content: Thought content normal.  Judgment: Judgment normal.       Results for orders placed or performed in visit on 08/01/23  [facility] MAMMOGRAPHY   Collection Time: 07/25/23  9:48 AM  Result Value Ref Range   HM Mammogram 0-4 Bi-Rad 0-4 Bi-Rad, Self Reported Normal    Assessment and Plan  Prediabetes Assessment & Plan: Due for re-eval. Some of her recent symptoms are concerning for possible development of diabetes.  Will check urinalysis to determine if urine smell is due to infection or new diabetes.  Orders: -     Hemoglobin A1c  Primary hypertension Assessment & Plan: Stable, chronic.  Continue current medication.     Class 1 obesity due to excess calories with serious comorbidity and body mass index (BMI) of 33.0 to 33.9 in adult Assessment & Plan: Has not yet started Zepbound .. daughters wedding and personal illness came up.   Abnormal urine odor  Post-infection bronchospasm Assessment & Plan: Acute, recent viral illness now improved. Persistent cough.  No sign of continued infection, lung exam clear. Can treat with benzonatate  200 mg twice daily as needed for cough.  If not improving as expected with time, will consider prednisone  or albuterol .   Easy bruising -     CBC with Differential/Platelet  B12 deficiency -     Vitamin B12  Hypercholesterolemia -     Lipid panel -     Comprehensive metabolic panel with GFR  Other orders -     Benzonatate ; Take 1 capsule (200 mg total) by mouth 2 (two) times daily as needed for cough.  Dispense: 20 capsule; Refill: 0    No follow-ups on file.   Herby Lolling, MD

## 2023-11-22 NOTE — Assessment & Plan Note (Signed)
 Acute, recent viral illness now improved. Persistent cough.  No sign of continued infection, lung exam clear. Can treat with benzonatate  200 mg twice daily as needed for cough.  If not improving as expected with time, will consider prednisone  or albuterol .

## 2023-11-22 NOTE — Assessment & Plan Note (Signed)
Stable, chronic.  Continue current medication.    

## 2023-11-23 ENCOUNTER — Ambulatory Visit: Payer: Self-pay | Admitting: Family Medicine

## 2023-11-23 DIAGNOSIS — E559 Vitamin D deficiency, unspecified: Secondary | ICD-10-CM | POA: Diagnosis not present

## 2023-11-23 DIAGNOSIS — R519 Headache, unspecified: Secondary | ICD-10-CM | POA: Diagnosis not present

## 2023-11-23 DIAGNOSIS — Z1331 Encounter for screening for depression: Secondary | ICD-10-CM | POA: Diagnosis not present

## 2023-11-23 DIAGNOSIS — D329 Benign neoplasm of meninges, unspecified: Secondary | ICD-10-CM | POA: Diagnosis not present

## 2023-11-27 ENCOUNTER — Other Ambulatory Visit: Payer: Self-pay | Admitting: Neurology

## 2023-11-27 DIAGNOSIS — D329 Benign neoplasm of meninges, unspecified: Secondary | ICD-10-CM

## 2023-11-28 ENCOUNTER — Ambulatory Visit: Attending: Neurology | Admitting: Speech Pathology

## 2023-11-28 DIAGNOSIS — R41841 Cognitive communication deficit: Secondary | ICD-10-CM | POA: Insufficient documentation

## 2023-11-28 DIAGNOSIS — G3184 Mild cognitive impairment, so stated: Secondary | ICD-10-CM | POA: Diagnosis not present

## 2023-11-28 DIAGNOSIS — R4701 Aphasia: Secondary | ICD-10-CM | POA: Diagnosis not present

## 2023-11-28 DIAGNOSIS — F5101 Primary insomnia: Secondary | ICD-10-CM | POA: Diagnosis not present

## 2023-11-28 DIAGNOSIS — F3181 Bipolar II disorder: Secondary | ICD-10-CM | POA: Diagnosis not present

## 2023-11-28 NOTE — Therapy (Signed)
 OUTPATIENT SPEECH LANGUAGE PATHOLOGY  COGNITION COMMUNICATION EVALUATION   Patient Name: Brandi Terrell MRN: [id number] DOB:1966/02/17, 58 y.o., female Today's Date: 11/28/2023  PCP: Herby Lolling, MD REFERRING PROVIDER: Devora Folks, MD   End of Session - 11/28/23 0826     Visit Number 1    Number of Visits 11    Date for SLP Re-Evaluation 02/06/24    Authorization Type [employer] Blue [employer]    Authorization Time Period 01/19/2023 thru 01/18/2024    Authorization - Visit Number 1    Authorization - Number of Visits 15    Progress Note Due on Visit 10    SLP Start Time 0810    SLP Stop Time  0850    SLP Time Calculation (min) 40 min    Activity Tolerance Patient tolerated treatment well             Past Medical History:  Diagnosis Date   Acid reflux    Basal cell carcinoma (BCC) of face    Depression    Hypertension    Inflammatory polyps of colon (HCC)    Malignant melanoma of skin of chest (HCC)    Sleep apnea    Past Surgical History:  Procedure Laterality Date   APPENDECTOMY  1976   CHOLECYSTECTOMY  2005   Patient Active Problem List   Diagnosis Date Noted   Post-infection bronchospasm 11/22/2023   Class 1 obesity due to excess calories with serious comorbidity and body mass index (BMI) of 33.0 to 33.9 in adult 04/26/2023   Epigastric pain 03/09/2023   Malignant melanoma (HCC) 02/02/2023   Trapezius strain, right, initial encounter 10/25/2022   Mild cognitive impairment 07/22/2022   Acute pain of left knee 06/23/2022   Forehead trauma 06/23/2022   Abnormal serum thyroxine (T4) level 05/20/2022   Grade I internal hemorrhoids 02/17/2022   Aphasia 12/31/2021   Poor balance 12/31/2021   Abnormal mammogram 12/31/2021   External hemorrhoids 03/23/2021   Elevated LFTs 02/25/2021   Hypercholesterolemia 04/26/2019   History of basal cell cancer 01/26/2017   GAD (generalized anxiety disorder) 01/26/2017   Chronic insomnia 01/26/2017   Hypertension  01/26/2017   Chronic GERD 01/26/2017   Family history of colon cancer 01/26/2017   Prediabetes 01/26/2017   Perineal pain in female, chronic 01/26/2017   Moderate recurrent major depression (HCC) 11/03/2016   B12 deficiency 10/24/2016   Obstructive sleep apnea 11/02/2015   H/O adenomatous polyp of colon 11/02/2015   Basal cell carcinoma of skin of other parts of face 06/24/2014    ONSET DATE: date of referral 11/27/2023; pt reports symptoms began ~ 2 years ago  REFERRING DIAG: R41.89 (ICD-10-CM) - Subjective cognitive impairment   THERAPY DIAG:  Cognitive communication deficit  Aphasia  Mild cognitive impairment  Rationale for Evaluation and Treatment Rehabilitation  SUBJECTIVE:   SUBJECTIVE STATEMENT: Pt pleasant, good historian, intermittent word finding present Pt accompanied by: self  PERTINENT HISTORY: ***  DIAGNOSTIC FINDINGS: ***  PAIN:  Are you having pain? No   FALLS: Has patient fallen in last 6 months?  No  LIVING ENVIRONMENT: Lives with: lives with their spouse Lives in: House/apartment  PLOF:  Level of assistance: Independent with ADLs, Independent with IADLs Employment: Full-time employment   PATIENT GOALS   to improve word finding and memory abilities  OBJECTIVE:   COGNITIVE COMMUNICATION Overall cognitive status: Within functional limits for tasks assessed Areas of impairment:  Memory: Impaired: Working Prospective Functional Impairments: ***  AUDITORY COMPREHENSION  Overall auditory comprehension: {  ZOXWRUEA:54098} YES/NO questions: {JXBJYNWG:95621} Following directions: {HYQMVHQI:69629} Conversation: {SLP conversation:25430} Interfering components: {SLP interfering components:25431} Effective technique: {SLP effective technique:25432}  READING COMPREHENSION: {SLPreadingcomprehension:27140}  EXPRESSION: {SLP EXPRESSION:25433}  VERBAL EXPRESSION:   Overall verbal expression: {IMPAIRED:25374} Level of generative/spontaneous  verbalization: {SLP level of generative/spontaneious verbalization:25435} Automatic speech: {SLP ATOMIC SPEECH:25434}  Repetition: {SLPrepetion:27212} Naming: {SLPnaming:27214} Pragmatics: {slppragmatics:27216} Comments: *** Interfering components: {SLP INTERFERING COMPONENTS:25436} Effective technique: {SLP EFFECTIVE TECHNIQUE:25437} Non-verbal means of communication: {SLP non verbal means of communication:25438}  WRITTEN EXPRESSION: Dominant hand: right Written expression: Appears intact  ORAL MOTOR EXAMINATION Facial : WFL Lingual: WFL Velum: WFL Mandible: WFL Cough: WFL Voice: WFL  MOTOR SPEECH: Overall motor speech: Appears intact Respiration: diaphragmatic/abdominal breathing Phonation: normal Resonance: WFL Articulation: Appears intact Intelligibility: Intelligible Motor planning: Appears intact  STANDARDIZED ASSESSMENTS: Addenbrooke's Cognitive Examination - ACE III The Addenbrooke's Cognitive Examination-III (ACE-III) is a brief cognitive test that assesses five cognitive domains. The total score is 100 with higher scores indicating better cognitive functioning. Cut off scores of 88 and 82 are recommended for suspicion of dementia (88 has sensitivity of 1.00 and specificity of 0.96, 82 has sensitivity of 0.93 and specificity of 1.00). American Version A  Attention 18/18  Memory 26/26  Fluency 12/14  Language 26/26  Visuospatial 16/16  TOTAL ACE- III Score 98/100     PATIENT REPORTED OUTCOME MEASURES (PROM): To be completed during the first 3 sessions   TODAY'S TREATMENT:  ***   PATIENT EDUCATION: Education details: results of this assessment, ST POC Person educated: Patient Education method: Explanation Education comprehension: verbalized understanding   HOME EXERCISE PROGRAM:   N/A   GOALS:  Goals reviewed with patient? Yes  SHORT TERM GOALS: Target date: 10 sessions  *** Baseline: Goal status: {GOALSTATUS:25110}  2.  *** Baseline:   Goal status: {GOALSTATUS:25110}  3.  *** Baseline:  Goal status: {GOALSTATUS:25110}  4.  *** Baseline:  Goal status: {GOALSTATUS:25110}  5.  *** Baseline:  Goal status: {GOALSTATUS:25110}  6.  *** Baseline:  Goal status: {GOALSTATUS:25110}  LONG TERM GOALS: Target date: 02/06/2024  *** Baseline:  Goal status: {GOALSTATUS:25110}  2.  *** Baseline:  Goal status: {GOALSTATUS:25110}  3.  *** Baseline:  Goal status: {GOALSTATUS:25110}  4.  *** Baseline:  Goal status: {GOALSTATUS:25110}  5.  *** Baseline:  Goal status: {GOALSTATUS:25110}  6.  *** Baseline:  Goal status: {GOALSTATUS:25110}  ASSESSMENT:  CLINICAL IMPRESSION: Patient is a *** y.o. *** who was seen today for ***.   OBJECTIVE IMPAIRMENTS include {SLPOBJIMP:27107}. These impairments are limiting patient from {SLPLIMIT:27108}. Factors affecting potential to achieve goals and functional outcome are {SLP factors:25450}. Patient will benefit from skilled SLP services to address above impairments and improve overall function.  REHAB POTENTIAL: {rehabpotential:25112}  PLAN: SLP FREQUENCY: {rehab frequency:25116}  SLP DURATION: {rehab duration:25117}  PLANNED INTERVENTIONS: {SLP treatment/interventions:25449}    Trai Ells B. Garlin Junker, M.S., CCC-SLP, Tree surgeon Certified Brain Injury Specialist [facility]  [facility] Rehabilitation Services Office [phone] Ascom [phone] Fax [phone]

## 2023-11-30 ENCOUNTER — Encounter: Payer: Self-pay | Admitting: Family Medicine

## 2023-11-30 ENCOUNTER — Ambulatory Visit
Admission: RE | Admit: 2023-11-30 | Discharge: 2023-11-30 | Disposition: A | Discharge status: Home / Self Care | Source: Ambulatory Visit | Attending: Neurology | Admitting: Neurology

## 2023-11-30 DIAGNOSIS — D329 Benign neoplasm of meninges, unspecified: Secondary | ICD-10-CM | POA: Insufficient documentation

## 2023-11-30 MED ORDER — GADOBUTROL 1 MMOL/ML IV SOLN
9.0000 mL | Freq: Once | INTRAVENOUS | Status: AC | PRN
  Administered 2023-11-30: 9 mL via INTRAVENOUS

## 2023-11-30 MED ORDER — ALPRAZOLAM 0.25 MG PO TABS: ORAL_TABLET | ORAL | 0 refills | Status: DC

## 2023-12-01 ENCOUNTER — Ambulatory Visit: Admitting: Speech Pathology

## 2023-12-01 DIAGNOSIS — R4701 Aphasia: Secondary | ICD-10-CM | POA: Diagnosis not present

## 2023-12-01 DIAGNOSIS — G3184 Mild cognitive impairment, so stated: Secondary | ICD-10-CM | POA: Diagnosis not present

## 2023-12-01 DIAGNOSIS — R41841 Cognitive communication deficit: Secondary | ICD-10-CM | POA: Diagnosis not present

## 2023-12-01 NOTE — Therapy (Signed)
 OUTPATIENT SPEECH LANGUAGE PATHOLOGY  COGNITION COMMUNICATION TREATMENT   Patient Name: Brandi Terrell MRN: [id number] DOB:09-19-1965, 58 y.o., female Today's Date: 12/01/2023  PCP: Herby Lolling, MD REFERRING PROVIDER: Devora Folks, MD   End of Session - 12/01/23 1156     Visit Number 2    Number of Visits 11    Date for SLP Re-Evaluation 02/06/24    Authorization Type Blue [employer] Blue [employer]    Authorization Time Period 01/19/2023 thru 01/18/2024    Authorization - Visit Number 2    Authorization - Number of Visits 15    Progress Note Due on Visit 10    SLP Start Time 0800    SLP Stop Time  0845    SLP Time Calculation (min) 45 min    Activity Tolerance Patient tolerated treatment well          Past Medical History:  Diagnosis Date   Acid reflux    Basal cell carcinoma (BCC) of face    Depression    Hypertension    Inflammatory polyps of colon (HCC)    Malignant melanoma of skin of chest (HCC)    Sleep apnea    Past Surgical History:  Procedure Laterality Date   APPENDECTOMY  1976   CHOLECYSTECTOMY  2005   Patient Active Problem List   Diagnosis Date Noted   Post-infection bronchospasm 11/22/2023   Class 1 obesity due to excess calories with serious comorbidity and body mass index (BMI) of 33.0 to 33.9 in adult 04/26/2023   Epigastric pain 03/09/2023   Malignant melanoma (HCC) 02/02/2023   Trapezius strain, right, initial encounter 10/25/2022   Mild cognitive impairment 07/22/2022   Acute pain of left knee 06/23/2022   Forehead trauma 06/23/2022   Abnormal serum thyroxine (T4) level 05/20/2022   Grade I internal hemorrhoids 02/17/2022   Aphasia 12/31/2021   Poor balance 12/31/2021   Abnormal mammogram 12/31/2021   External hemorrhoids 03/23/2021   Elevated LFTs 02/25/2021   Hypercholesterolemia 04/26/2019   History of basal cell cancer 01/26/2017   GAD (generalized anxiety disorder) 01/26/2017   Chronic insomnia 01/26/2017   Hypertension 01/26/2017    Chronic GERD 01/26/2017   Family history of colon cancer 01/26/2017   Prediabetes 01/26/2017   Perineal pain in female, chronic 01/26/2017   Moderate recurrent major depression (HCC) 11/03/2016   B12 deficiency 10/24/2016   Obstructive sleep apnea 11/02/2015   H/O adenomatous polyp of colon 11/02/2015   Basal cell carcinoma of skin of other parts of face 06/24/2014    ONSET DATE: date of referral 11/27/2023; pt reports symptoms began ~ 2 years ago (March 2023)  REFERRING DIAG: R41.89 (ICD-10-CM) - Subjective cognitive impairment   THERAPY DIAG:  Cognitive communication deficit  Rationale for Evaluation and Treatment Rehabilitation  SUBJECTIVE:   PERTINENT HISTORY and DIAGNOSTIC FINDINGS:   Pt is a 58 year female referred by neurologist for cognitive communication assessment and training. She presents with report of subjective word-finding difficulty and short term memory loss occurring around March 2023, anxiety, biploar disorders (diagnosed in 30s/early 45s) uncontrolled obstructive sleep apnea, vitamin D  deficiency, family history of dementia, chronic right frontal region headaches.   02/05/2022 - MRI 5 mm nodular dural-based enhancing focus overlying the high posterior left frontal lobe, likely reflecting a small meningioma. No significant mass effect upon the underlying brain parenchyma. Multifocal T2 FLAIR hyperintense signal abnormality within the cerebral white matter, overall mild but advanced for age. These signal changes are nonspecific and differential considerations chronic small vessel  ischemic disease, sequelae of chronic migraine headaches or sequelae of a prior infectious/inflammatory process, among others. The distribution would be atypical for demyelinating disease. No age advanced or lobar predominant parenchymal atrophy.   Results of Neuropsychological Evaluation (11/03/2022) Mrs. Boutelle's test performance suggests mild, yet significant, non-amnestic neurocognitive  deficits in the session of presumed above average premorbid abilities. Organic etiology cannot be fully ruled out. Recent history of concussion (November 2023) could explain worsening focal deficits in executive functions, processing/learning, and fluid (nonverbal) reasoning. However, chronic medical factors (ex. Sleep disorder; post-viral syndrome/COVID-19 infection in 2022); along with persistent depression/anxiety, are core etiologies predating the concussion and have contributed to protracted recovery. Regardless, this does not appear to be a cortical neurodegenerative disease. Mrs. Wuellner presently meets the criteria for Mild Neurocognitive Disorder.   Normal beta-amyloid 42/40 ratio and normal concentration of pTau 181 and NfL (neurology note 07/09/2023) as well as APOE genetic testing that showed an E3/E3 variant which is not associated with increased risk of development of late-onset Alzheimer's Disease compared to general populations.    PAIN:  Are you having pain? No   FALLS: Has patient fallen in last 6 months?  No  LIVING ENVIRONMENT: Lives with: lives with their spouse Lives in: House/apartment  PLOF:  Level of assistance: Independent with ADLs, Independent with IADLs Employment: Full-time employment   PATIENT GOALS   to improve word finding and memory abilities  SUBJECTIVE STATEMENT: Pt pleasant, eager, states they are going to [place] to celebrate Father's Day Pt accompanied by: self  OBJECTIVE:   TODAY'S TREATMENT:  Skilled treatment session focused on pt's cognitive communication goals. SLP facilitated session by providing the following interventions:  Vocabulary building was targeted thru use of [employer] 8: Word Finding page 51 Generation of 3 synonyms for targeted word - pt able to provide 3 synonyms in 20 out of 23 opportunities.   Pt also able to describe semantic target using semantic feature analysis with constraints benefiting from mildly extended time. Overall  great success with activities presented. Pt stated that was fun, I enjoyed that    PATIENT REPORTED OUTCOME MEASURES (PROM): To be completed during the first 3 sessions   PATIENT EDUCATION: Education details: results of this assessment, ST POC Person educated: Patient Education method: Explanation Education comprehension: verbalized understanding   HOME EXERCISE PROGRAM:   N/A   GOALS:  Goals reviewed with patient? Yes  SHORT TERM GOALS: Target date: 10 sessions  Pt. will recall 3/4 memory strategies (w-write it, r-repeat, a-associate it, p-picture) and utilize strategies in structured and functional memory tasks to recall 90% of presented information with Mod I ability.  Baseline: Goal status: INITIAL  With Mod I, patient will complete a semantic feature analysis with at least 4 relevant features for 5/7 target words to improve word-finding skills.  Baseline: Goal status: INITIAL  2.  With Mod I, patient will name abstract words and phrases from description at 95% accuracy.   Baseline:  Goal status: INITIAL   LONG TERM GOALS: Target date: 02/06/2024  With Mod I, patient will use strategies to improve memory for important information with 90% acc. Independently (ie., white board, daily planner/calendar, Apps on phone).   Baseline:  Goal status: INITIAL  2.  With Mod I, patient will demonstrate knowledge of appropriate activities to support cognitive and  language function outside of ST.  Baseline:  Goal status: INITIAL  3.  With Mod I, patient will participate in complex conversation at 95% accuracy to increase ability to communicate  complex thoughts, feelings, and needs.  Baseline:  Goal status: INITIAL   ASSESSMENT:  CLINICAL IMPRESSION: Patient is a 58 y.o. female who was seen today for a cognitive communication treatment d/t report of subjective cognitive impairment.   Pt with eager response to therapy activities. See the above treatment note for  details.   OBJECTIVE IMPAIRMENTS include memory and expressive language. These impairments are limiting patient from managing medications, managing appointments, managing finances, household responsibilities, ADLs/IADLs, and effectively communicating at home and in community. Factors affecting potential to achieve goals and functional outcome are co-morbidities. Patient will benefit from skilled SLP services to address above impairments and improve overall function.  REHAB POTENTIAL: Good  PLAN: SLP FREQUENCY: 1-2x/week  SLP DURATION: 8 weeks  PLANNED INTERVENTIONS: SLP instruction and feedback, Compensatory strategies, Patient/family education, and [id number] Treatment of speech (30 or 45 min)     Sui Kasparek B. Garlin Junker, M.S., CCC-SLP, Tree surgeon Certified Brain Injury Specialist [facility]  [facility] Rehabilitation Services Office [phone] Ascom [phone] Fax [phone]

## 2023-12-06 ENCOUNTER — Ambulatory Visit

## 2023-12-07 DIAGNOSIS — H0011 Chalazion right upper eyelid: Secondary | ICD-10-CM | POA: Diagnosis not present

## 2023-12-13 ENCOUNTER — Encounter: Payer: Self-pay | Admitting: Internal Medicine

## 2023-12-13 ENCOUNTER — Ambulatory Visit (INDEPENDENT_AMBULATORY_CARE_PROVIDER_SITE_OTHER): Admitting: Internal Medicine

## 2023-12-13 ENCOUNTER — Ambulatory Visit: Admitting: Speech Pathology

## 2023-12-13 VITALS — BP 122/78 | HR 76 | Temp 98.7°F | Ht 64.0 in | Wt 194.8 lb

## 2023-12-13 DIAGNOSIS — G4733 Obstructive sleep apnea (adult) (pediatric): Secondary | ICD-10-CM

## 2023-12-13 DIAGNOSIS — G3184 Mild cognitive impairment, so stated: Secondary | ICD-10-CM | POA: Diagnosis not present

## 2023-12-13 DIAGNOSIS — R41841 Cognitive communication deficit: Secondary | ICD-10-CM

## 2023-12-13 DIAGNOSIS — R4701 Aphasia: Secondary | ICD-10-CM | POA: Diagnosis not present

## 2023-12-13 NOTE — Patient Instructions (Signed)
 Recommend Referral to [facility] assessment referral Please let us  know when your insurance changes, we can then refer you to [employer] company  Avoid Allergens and Irritants Avoid secondhand smoke Avoid SICK contacts Recommend  Masking  when appropriate Recommend Keep up-to-date with vaccinations

## 2023-12-13 NOTE — Progress Notes (Signed)
 [facility] Pulmonary Medicine Consultation      Date: 12/13/2023,   MRN# [id number] Brandi Terrell 1966-04-19    Synopsis:  patient seen for chronic SOB and DOE with voice changes and vocal cord weakness with recurrent bouts of URI's chronic vocal cord dysfunction Patient previously diagnosed with chronic vocal cord dysfunction Seems to have resolved was treated at [facility] Patient previous use of oxygen at nighttime but noncompliant with oxygen and CPAP therapy She has a history of grade 1 diastolic dysfunction based on echocardiogram Patient was diagnosed with pancreatitis Subsequently had a CT calcium  score Patient states that she falls asleep subsequently bites her tongue   TESTS Previous sleep study 2019 HST showed severe sleep apnea AHI of 31 Patient attempted and tried CPAP machine however did not tolerated Patient did not follow-up Patient repeat HST July 20, 2023 shows AHI of 18 previously diagnosed with chronic vocal cord dysfunction   TESTS  CHIEF COMPLAINT:  Follow-up assessment for OSA   HISTORY OF PRESENT ILLNESS  Previous sleep study 2019 HST showed severe sleep apnea AHI of 31 Patient attempted and tried CPAP machine however did not tolerated Patient did not follow-up Patient is noncompliant at this time due to mask issues   Discussed sleep data and reviewed with patient.  Encouraged proper weight management.  Discussed driving precautions and its relationship with hypersomnolence.  Discussed sleep hygiene, and benefits of a fixed sleep waked time.  The importance of getting eight or more hours of sleep discussed with patient.  Discussed limiting the use of the computer and television before bedtime.  Decrease naps during the day, so night time sleep will become enhanced.  Limit caffeine, and sleep deprivation.   Patient did not follow-up with oral device Patient is noncompliant due to mask issues  No exacerbation at this time No evidence of heart  failure at this time No evidence or signs of infection at this time No respiratory distress No fevers, chills, nausea, vomiting, diarrhea No evidence of lower extremity edema No evidence hemoptysis    Current Medication:  Current Outpatient Medications:    ALPRAZolam  (XANAX ) 0.25 MG tablet, 1-2 tabs prior to procedure, Disp: 2 tablet, Rfl: 0   amLODipine -valsartan  (EXFORGE ) 10-320 MG tablet, Take 1 tablet by mouth daily., Disp: 90 tablet, Rfl: 1   atorvastatin  (LIPITOR) 20 MG tablet, Take 1 tablet (20 mg total) by mouth daily., Disp: 90 tablet, Rfl: 3   benzonatate  (TESSALON ) 200 MG capsule, Take 1 capsule (200 mg total) by mouth 2 (two) times daily as needed for cough., Disp: 20 capsule, Rfl: 0   lamoTRIgine  (LAMICTAL ) 200 MG tablet, Take 200 mg by mouth daily., Disp: , Rfl:    metoprolol  succinate (TOPROL -XL) 100 MG 24 hr tablet, Take 1 tablet (100 mg total) by mouth daily. Take with or immediately following a meal., Disp: 90 tablet, Rfl: 0   omeprazole  (PRILOSEC) 40 MG capsule, Take 1 capsule (40 mg total) by mouth daily., Disp: 90 capsule, Rfl: 2   tirzepatide  (ZEPBOUND ) 5 MG/0.5ML Pen, Inject 5 mg into the skin once a week. (Patient not taking: Reported on 11/22/2023), Disp: 2 mL, Rfl: 0   zolpidem  (AMBIEN ) 10 MG tablet, Take 10 mg by mouth at bedtime., Disp: , Rfl:     ALLERGIES   Sulfa antibiotics, Levofloxacin, Tramadol, Mirabegron, and Cephalosporins    CT chest 01/21/16 images  reviewed No abnormal findings at this time  BP 122/78 (BP Location: Right Arm, Patient Position: Sitting, Cuff Size: Large)  Pulse 76   Temp 98.7 F (37.1 C) (Oral)   Ht 5' 4 (1.626 m)   Wt 194 lb 12.8 oz (88.4 kg)   LMP  (LMP Unknown)   SpO2 94%   BMI 33.44 kg/m     Review of Systems: Gen:  Denies  fever, sweats, chills weight loss  HEENT: Denies blurred vision, double vision, ear pain, eye pain, hearing loss, nose bleeds, sore throat Cardiac:  No dizziness, chest pain or heaviness,  chest tightness,edema, No JVD Resp:   No cough, -sputum production, -shortness of breath,-wheezing, -hemoptysis,  Other:  All other systems negative   Physical Examination:   General Appearance: No distress  EYES PERRLA, EOM intact.   NECK Supple, No JVD Pulmonary: normal breath sounds, No wheezing.  CardiovascularNormal S1,S2.  No m/r/g.   Abdomen: Benign, Soft, non-tender. Neurology UE/LE 5/5 strength, no focal deficits Ext pulses intact, cap refill intact ALL OTHER ROS ARE NEGATIVE     ASSESSMENT/PLAN   58 year old obese white female seen today for follow-up assessment for severe sleep apnea previously diagnosed in 2019 with AHI of 31 in the setting of obesity and deconditioned state with underlying diastolic dysfunction with a previous history of vocal cord dysfunction with deconditioned state, also some symptoms of tongue biting while sleeping   Severe sleep apnea AHI of 31  Repeat home sleep study to assess diagnosis shows AHI of 18 Assessment of OSA Previous AHI 18 Patient noncompliant due to mask issues Referral to [facility]  Long mask desensitization protocol assessment Reviewed compliance report in detail with patient   No evidence of acute heart failure at this time No respiratory distress No fevers, chills, nausea, vomiting, diarrhea No evidence hemoptysis  Be aware of reduced alertness and do not drive or operate heavy machinery if experiencing this or drowsiness.  Exercise encouraged, as tolerated. Encouraged proper weight management.  Important to get eight or more hours of sleep  Limiting the use of the computer and television before bedtime.  Decrease naps during the day, so night time sleep will become enhanced.  Limit caffeine, and sleep deprivation.  HTN, stroke, uncontrolled diabetes and heart failure are potential risk factors.  Risk of untreated sleep apnea including cardiac arrhthymias, stroke, DM, pulm HTN.    Obesity -recommend significant  weight loss -recommend changing diet  Deconditioned state -Recommend increased daily activity and exercise'  Neurology consultation for tongue biting Patient has seen Dr. Maree neurology follow-up as scheduled   At this time her vocal cord dysfunction has completely resolved No further work-up at this time Previous pulmonary function testing was within normal limits Follow-up speech evaluation    MEDICATION ADJUSTMENTS/LABS AND TESTS ORDERED: Recommend Referral to [facility] assessment referral Please let us  know when your insurance changes, we can then refer you to [employer] company Avoid Allergens and Irritants Avoid secondhand smoke Avoid SICK contacts Recommend  Masking  when appropriate Recommend Keep up-to-date with vaccinations    CURRENT MEDICATIONS REVIEWED AT LENGTH WITH PATIENT TODAY   Patient  satisfied with Plan of action and management. All questions answered   Follow up 6 months   I spent a total of 42 minutes reviewing chart data, face-to-face evaluation with the patient, counseling and coordination of care as detailed above.      Nickolas Alm Cellar, M.D.  [facility]  Medical Director [facility] Medical Director [facility] Cardio-Pulmonary Department

## 2023-12-13 NOTE — Therapy (Signed)
 OUTPATIENT SPEECH LANGUAGE PATHOLOGY  COGNITION COMMUNICATION TREATMENT   Patient Name: Brandi Terrell MRN: [id number] DOB:[birth year], 58 y.o., female Today's Date: 12/13/2023  PCP: Greig Ring, MD REFERRING PROVIDER: Jannett Fairly, MD   End of Session - 12/13/23 1234     Visit Number 3    Number of Visits 11    Date for SLP Re-Evaluation 02/06/24    Authorization Type [employer]    Authorization Time Period 01/19/2023 thru 01/18/2024    Authorization - Visit Number 3    Authorization - Number of Visits 15    Progress Note Due on Visit 10    SLP Start Time 1015    SLP Stop Time  1100    SLP Time Calculation (min) 45 min    Activity Tolerance Patient tolerated treatment well          Past Medical History:  Diagnosis Date   Acid reflux    Basal cell carcinoma (BCC) of face    Depression    Hypertension    Inflammatory polyps of colon (HCC)    Malignant melanoma of skin of chest (HCC)    Sleep apnea    Past Surgical History:  Procedure Laterality Date   APPENDECTOMY  1976   CHOLECYSTECTOMY  2005   Patient Active Problem List   Diagnosis Date Noted   Post-infection bronchospasm 11/22/2023   Class 1 obesity due to excess calories with serious comorbidity and body mass index (BMI) of 33.0 to 33.9 in adult 04/26/2023   Epigastric pain 03/09/2023   Malignant melanoma (HCC) 02/02/2023   Trapezius strain, right, initial encounter 10/25/2022   Mild cognitive impairment 07/22/2022   Acute pain of left knee 06/23/2022   Forehead trauma 06/23/2022   Abnormal serum thyroxine (T4) level 05/20/2022   Grade I internal hemorrhoids 02/17/2022   Aphasia 12/31/2021   Poor balance 12/31/2021   Abnormal mammogram 12/31/2021   External hemorrhoids 03/23/2021   Elevated LFTs 02/25/2021   Hypercholesterolemia 04/26/2019   History of basal cell cancer 01/26/2017   GAD (generalized anxiety disorder) 01/26/2017   Chronic insomnia 01/26/2017   Hypertension 01/26/2017    Chronic GERD 01/26/2017   Family history of colon cancer 01/26/2017   Prediabetes 01/26/2017   Perineal pain in female, chronic 01/26/2017   Moderate recurrent major depression (HCC) 11/03/2016   B12 deficiency 10/24/2016   Obstructive sleep apnea 11/02/2015   H/O adenomatous polyp of colon 11/02/2015   Basal cell carcinoma of skin of other parts of face 06/24/2014    ONSET DATE: date of referral 11/27/2023; pt reports symptoms began ~ 2 years ago (March 2023)  REFERRING DIAG: R41.89 (ICD-10-CM) - Subjective cognitive impairment   THERAPY DIAG:  Cognitive communication deficit  Rationale for Evaluation and Treatment Rehabilitation  SUBJECTIVE:   PERTINENT HISTORY and DIAGNOSTIC FINDINGS:   Pt is a 58 year female referred by neurologist for cognitive communication assessment and training. She presents with report of subjective word-finding difficulty and short term memory loss occurring around March 2023, anxiety, biploar disorders (diagnosed in 30s/early 62s) uncontrolled obstructive sleep apnea, vitamin D  deficiency, family history of dementia, chronic right frontal region headaches.   02/05/2022 - MRI 5 mm nodular dural-based enhancing focus overlying the high posterior left frontal lobe, likely reflecting a small meningioma. No significant mass effect upon the underlying brain parenchyma. Multifocal T2 FLAIR hyperintense signal abnormality within the cerebral white matter, overall mild but advanced for age. These signal changes are nonspecific and differential considerations chronic small vessel  ischemic disease, sequelae of chronic migraine headaches or sequelae of a prior infectious/inflammatory process, among others. The distribution would be atypical for demyelinating disease. No age advanced or lobar predominant parenchymal atrophy.   Results of Neuropsychological Evaluation (11/03/2022) [name]'s test performance suggests mild, yet significant, non-amnestic neurocognitive  deficits in the session of presumed above average premorbid abilities. Organic etiology cannot be fully ruled out. Recent history of concussion (November 2023) could explain worsening focal deficits in executive functions, processing/learning, and fluid (nonverbal) reasoning. However, chronic medical factors (ex. Sleep disorder; post-viral syndrome/COVID-19 infection in 2022); along with persistent depression/anxiety, are core etiologies predating the concussion and have contributed to protracted recovery. Regardless, this does not appear to be a cortical neurodegenerative disease. [name] presently meets the criteria for Mild Neurocognitive Disorder.   Normal beta-amyloid 42/40 ratio and normal concentration of pTau 181 and NfL (neurology note 07/09/2023) as well as APOE genetic testing that showed an E3/E3 variant which is not associated with increased risk of development of late-onset Alzheimer's Disease compared to general populations.    PAIN:  Are you having pain? No   FALLS: Has patient fallen in last 6 months?  No  LIVING ENVIRONMENT: Lives with: lives with their spouse Lives in: House/apartment  PLOF:  Level of assistance: Independent with ADLs, Independent with IADLs Employment: Full-time employment   PATIENT GOALS   to improve word finding and memory abilities  SUBJECTIVE STATEMENT: Pt pleasant, eager, states they had a great time in [place] to celebrate Father's Day Pt accompanied by: self  OBJECTIVE:   TODAY'S TREATMENT:  Skilled treatment session focused on pt's cognitive communication goals. SLP facilitated session by providing the following interventions:  Pt reports we went to the beach for a couple of days this past weekend, I noticed at least one maybe twice and then at home yesterday or the day before I kind of got my words mixed up She further states, I called the cat a dog. She further comments that she had just looked at her pajama pants that had a dog  face on them. Education provided on likely normalcy of this semantic error vs it indicating a language impairment as well as reassuring lab results and neuropsychological testing.   Pt's verbal expression was free of dysfluencies or word finding difficulty when describing the decline of family members as they experienced dementia and cognitive decline as well as her anxiety and fear related to experiencing the same. Skilled education provided on influence of anxiety/psychological issues on cognition with recommendation for pt to reach out to psychiatrist. Information provided.    PATIENT REPORTED OUTCOME MEASURES (PROM): To be completed during the first 3 sessions   PATIENT EDUCATION: Education details: see above Person educated: Patient Education method: Explanation Education comprehension: verbalized understanding   HOME EXERCISE PROGRAM:   N/A   GOALS:  Goals reviewed with patient? Yes  SHORT TERM GOALS: Target date: 10 sessions  Pt. will recall 3/4 memory strategies (w-write it, r-repeat, a-associate it, p-picture) and utilize strategies in structured and functional memory tasks to recall 90% of presented information with Mod I ability.  Baseline: Goal status: INITIAL  With Mod I, patient will complete a semantic feature analysis with at least 4 relevant features for 5/7 target words to improve word-finding skills.  Baseline: Goal status: INITIAL  2.  With Mod I, patient will name abstract words and phrases from description at 95% accuracy.   Baseline:  Goal status: INITIAL   LONG TERM GOALS: Target date: 02/06/2024  With Mod I, patient will use strategies to improve memory for important information with 90% acc. Independently (ie., white board, daily planner/calendar, Apps on phone).   Baseline:  Goal status: INITIAL  2.  With Mod I, patient will demonstrate knowledge of appropriate activities to support cognitive and  language function outside of ST.  Baseline:   Goal status: INITIAL  3.  With Mod I, patient will participate in complex conversation at 95% accuracy to increase ability to communicate complex thoughts, feelings, and needs.  Baseline:  Goal status: INITIAL   ASSESSMENT:  CLINICAL IMPRESSION: Patient is a 58 y.o. female who was seen today for a cognitive communication treatment d/t report of subjective cognitive impairment.   Pt receptive to information above.  See the above treatment note for details.   OBJECTIVE IMPAIRMENTS include memory and expressive language. These impairments are limiting patient from managing medications, managing appointments, managing finances, household responsibilities, ADLs/IADLs, and effectively communicating at home and in community. Factors affecting potential to achieve goals and functional outcome are co-morbidities. Patient will benefit from skilled SLP services to address above impairments and improve overall function.  REHAB POTENTIAL: Good  PLAN: SLP FREQUENCY: 1-2x/week  SLP DURATION: 8 weeks  PLANNED INTERVENTIONS: SLP instruction and feedback, Compensatory strategies, Patient/family education, and [id number] Treatment of speech (30 or 45 min)     Treniyah Lynn B. Rubbie, M.S., CCC-SLP, Tree surgeon Certified Brain Injury Specialist [facility]  [facility] Office [phone] Ascom [phone] Fax [phone]

## 2023-12-15 ENCOUNTER — Ambulatory Visit: Admitting: Speech Pathology

## 2023-12-15 DIAGNOSIS — R41841 Cognitive communication deficit: Secondary | ICD-10-CM

## 2023-12-15 DIAGNOSIS — R4701 Aphasia: Secondary | ICD-10-CM

## 2023-12-15 DIAGNOSIS — G3184 Mild cognitive impairment, so stated: Secondary | ICD-10-CM | POA: Diagnosis not present

## 2023-12-15 NOTE — Therapy (Signed)
 OUTPATIENT SPEECH LANGUAGE PATHOLOGY  COGNITION COMMUNICATION TREATMENT   Patient Name: Brandi Terrell MRN: [id number] DOB:[birth year], 58 y.o., female Today's Date: 12/15/2023  PCP: Brandi Ring, MD REFERRING PROVIDER: Jannett Fairly, MD   End of Session - 12/15/23 1344     Visit Number 4    Number of Visits 11    Date for SLP Re-Evaluation 02/06/24    Authorization Type [employer]    Authorization Time Period 01/19/2023 thru 01/18/2024    Authorization - Visit Number 4    Authorization - Number of Visits 15    Progress Note Due on Visit 10    SLP Start Time 0850    SLP Stop Time  0935    SLP Time Calculation (min) 45 min    Activity Tolerance Patient tolerated treatment well          Past Medical History:  Diagnosis Date   Acid reflux    Basal cell carcinoma (BCC) of face    Depression    Hypertension    Inflammatory polyps of colon (HCC)    Malignant melanoma of skin of chest (HCC)    Sleep apnea    Past Surgical History:  Procedure Laterality Date   APPENDECTOMY  1976   CHOLECYSTECTOMY  2005   Patient Active Problem List   Diagnosis Date Noted   Post-infection bronchospasm 11/22/2023   Class 1 obesity due to excess calories with serious comorbidity and body mass index (BMI) of 33.0 to 33.9 in adult 04/26/2023   Epigastric pain 03/09/2023   Malignant melanoma (HCC) 02/02/2023   Trapezius strain, right, initial encounter 10/25/2022   Mild cognitive impairment 07/22/2022   Acute pain of left knee 06/23/2022   Forehead trauma 06/23/2022   Abnormal serum thyroxine (T4) level 05/20/2022   Grade I internal hemorrhoids 02/17/2022   Aphasia 12/31/2021   Poor balance 12/31/2021   Abnormal mammogram 12/31/2021   External hemorrhoids 03/23/2021   Elevated LFTs 02/25/2021   Hypercholesterolemia 04/26/2019   History of basal cell cancer 01/26/2017   GAD (generalized anxiety disorder) 01/26/2017   Chronic insomnia 01/26/2017   Hypertension 01/26/2017    Chronic GERD 01/26/2017   Family history of colon cancer 01/26/2017   Prediabetes 01/26/2017   Perineal pain in female, chronic 01/26/2017   Moderate recurrent major depression (HCC) 11/03/2016   B12 deficiency 10/24/2016   Obstructive sleep apnea 11/02/2015   H/O adenomatous polyp of colon 11/02/2015   Basal cell carcinoma of skin of other parts of face 06/24/2014    ONSET DATE: date of referral 11/27/2023; pt reports symptoms began ~ 2 years ago (March 2023)  REFERRING DIAG: R41.89 (ICD-10-CM) - Subjective cognitive impairment   THERAPY DIAG:  Cognitive communication deficit  Aphasia  Rationale for Evaluation and Treatment Rehabilitation  SUBJECTIVE:   PERTINENT HISTORY and DIAGNOSTIC FINDINGS:   Pt is a 58 year female referred by neurologist for cognitive communication assessment and training. She presents with report of subjective word-finding difficulty and short term memory loss occurring around March 2023, anxiety, biploar disorders (diagnosed in 30s/early 31s) uncontrolled obstructive sleep apnea, vitamin D  deficiency, family history of dementia, chronic right frontal region headaches.   02/05/2022 - MRI 5 mm nodular dural-based enhancing focus overlying the high posterior left frontal lobe, likely reflecting a small meningioma. No significant mass effect upon the underlying brain parenchyma. Multifocal T2 FLAIR hyperintense signal abnormality within the cerebral white matter, overall mild but advanced for age. These signal changes are nonspecific and differential considerations chronic  small vessel ischemic disease, sequelae of chronic migraine headaches or sequelae of a prior infectious/inflammatory process, among others. The distribution would be atypical for demyelinating disease. No age advanced or lobar predominant parenchymal atrophy.   Results of Neuropsychological Evaluation (11/03/2022) [name]'s test performance suggests mild, yet significant, non-amnestic  neurocognitive deficits in the session of presumed above average premorbid abilities. Organic etiology cannot be fully ruled out. Recent history of concussion (November 2023) could explain worsening focal deficits in executive functions, processing/learning, and fluid (nonverbal) reasoning. However, chronic medical factors (ex. Sleep disorder; post-viral syndrome/COVID-19 infection in 2022); along with persistent depression/anxiety, are core etiologies predating the concussion and have contributed to protracted recovery. Regardless, this does not appear to be a cortical neurodegenerative disease. [name] presently meets the criteria for Mild Neurocognitive Disorder.   Normal beta-amyloid 42/40 ratio and normal concentration of pTau 181 and NfL (neurology note 07/09/2023) as well as APOE genetic testing that showed an E3/E3 variant which is not associated with increased risk of development of late-onset Alzheimer's Disease compared to general populations.    PAIN:  Are you having pain? No   FALLS: Has patient fallen in last 6 months?  No  LIVING ENVIRONMENT: Lives with: lives with their spouse Lives in: House/apartment  PLOF:  Level of assistance: Independent with ADLs, Independent with IADLs Employment: Full-time employment   PATIENT GOALS   to improve word finding and memory abilities  SUBJECTIVE STATEMENT: Pt reports follow thru with researching psychiatrist Pt accompanied by: self  OBJECTIVE:   TODAY'S TREATMENT:  Skilled treatment session focused on pt's cognitive communication goals. SLP facilitated session by providing the following interventions:  Motivational interviewing provided with pt reporting continued good ability with written language (emails and text messaging) as well as good job performance overall.   Skilled verbal and written education provided pertaining to list of activities to target cognitive and language abilities such as reading, reading out loud,  singing as well as list of several apps that pt mentions she will research.   Pt also able to describe complex semantic target using semantic feature analysis with constraints benefiting with increased ability and no extra time.  Overall great success with activities presented. Pt reports increased confidence.   PATIENT REPORTED OUTCOME MEASURES (PROM): To be completed during the first 3 sessions   PATIENT EDUCATION: Education details: see above Person educated: Patient Education method: Explanation Education comprehension: verbalized understanding   HOME EXERCISE PROGRAM:   N/A   GOALS:  Goals reviewed with patient? Yes  SHORT TERM GOALS: Target date: 10 sessions  Pt. will recall 3/4 memory strategies (w-write it, r-repeat, a-associate it, p-picture) and utilize strategies in structured and functional memory tasks to recall 90% of presented information with Mod I ability.  Baseline: Goal status: INITIAL  With Mod I, patient will complete a semantic feature analysis with at least 4 relevant features for 5/7 target words to improve word-finding skills.  Baseline: Goal status: INITIAL  2.  With Mod I, patient will name abstract words and phrases from description at 95% accuracy.   Baseline:  Goal status: INITIAL   LONG TERM GOALS: Target date: 02/06/2024  With Mod I, patient will use strategies to improve memory for important information with 90% acc. Independently (ie., white board, daily planner/calendar, Apps on phone).   Baseline:  Goal status: INITIAL  2.  With Mod I, patient will demonstrate knowledge of appropriate activities to support cognitive and  language function outside of ST.  Baseline:  Goal status:  INITIAL  3.  With Mod I, patient will participate in complex conversation at 95% accuracy to increase ability to communicate complex thoughts, feelings, and needs.  Baseline:  Goal status: INITIAL   ASSESSMENT:  CLINICAL IMPRESSION: Patient is a 58  y.o. female who was seen today for a cognitive communication treatment d/t report of subjective cognitive impairment.   Pt receptive to information above.  See the above treatment note for details.   OBJECTIVE IMPAIRMENTS include memory and expressive language. These impairments are limiting patient from managing medications, managing appointments, managing finances, household responsibilities, ADLs/IADLs, and effectively communicating at home and in community. Factors affecting potential to achieve goals and functional outcome are co-morbidities. Patient will benefit from skilled SLP services to address above impairments and improve overall function.  REHAB POTENTIAL: Good  PLAN: SLP FREQUENCY: 1-2x/week  SLP DURATION: 8 weeks  PLANNED INTERVENTIONS: SLP instruction and feedback, Compensatory strategies, Patient/family education, and 07492 Treatment of speech (30 or 45 min)     Karli Wickizer B. Rubbie, M.S., CCC-SLP, Tree surgeon Certified Brain Injury Specialist [facility]  [facility] Rehabilitation Services Office [phone] Ascom [phone] Fax [phone]

## 2023-12-18 DIAGNOSIS — G4733 Obstructive sleep apnea (adult) (pediatric): Secondary | ICD-10-CM | POA: Diagnosis not present

## 2023-12-19 ENCOUNTER — Encounter: Payer: Self-pay | Admitting: Internal Medicine

## 2023-12-19 ENCOUNTER — Telehealth: Payer: Self-pay

## 2023-12-19 ENCOUNTER — Ambulatory Visit: Attending: Neurology | Admitting: Speech Pathology

## 2023-12-19 DIAGNOSIS — R41841 Cognitive communication deficit: Secondary | ICD-10-CM | POA: Insufficient documentation

## 2023-12-19 DIAGNOSIS — R4701 Aphasia: Secondary | ICD-10-CM | POA: Diagnosis not present

## 2023-12-19 DIAGNOSIS — G4733 Obstructive sleep apnea (adult) (pediatric): Secondary | ICD-10-CM

## 2023-12-19 NOTE — Telephone Encounter (Signed)
 See patient message from today.Order has been placed.  Nothing further needed.

## 2023-12-19 NOTE — Therapy (Signed)
 OUTPATIENT SPEECH LANGUAGE PATHOLOGY  COGNITION COMMUNICATION TREATMENT DISCHARGE SUMMARY   Patient Name: AEISHA MINARIK MRN: [id number] DOB:26-Jun-1965, 58 y.o., female Today's Date: 12/19/2023  PCP: Greig Ring, MD REFERRING PROVIDER: Jannett Fairly, MD   End of Session - 12/19/23 0814     Visit Number 5    Number of Visits 11    Date for SLP Re-Evaluation 02/06/24    Authorization Type [employer]    Authorization Time Period 01/19/2023 thru 01/18/2024    Authorization - Visit Number 5    Authorization - Number of Visits 15    Progress Note Due on Visit 10    SLP Start Time 0805    SLP Stop Time  0845    SLP Time Calculation (min) 40 min    Activity Tolerance Patient tolerated treatment well          Past Medical History:  Diagnosis Date   Acid reflux    Basal cell carcinoma (BCC) of face    Depression    Hypertension    Inflammatory polyps of colon (HCC)    Malignant melanoma of skin of chest (HCC)    Sleep apnea    Past Surgical History:  Procedure Laterality Date   APPENDECTOMY  1976   CHOLECYSTECTOMY  2005   Patient Active Problem List   Diagnosis Date Noted   Post-infection bronchospasm 11/22/2023   Class 1 obesity due to excess calories with serious comorbidity and body mass index (BMI) of 33.0 to 33.9 in adult 04/26/2023   Epigastric pain 03/09/2023   Malignant melanoma (HCC) 02/02/2023   Trapezius strain, right, initial encounter 10/25/2022   Mild cognitive impairment 07/22/2022   Acute pain of left knee 06/23/2022   Forehead trauma 06/23/2022   Abnormal serum thyroxine (T4) level 05/20/2022   Grade I internal hemorrhoids 02/17/2022   Aphasia 12/31/2021   Poor balance 12/31/2021   Abnormal mammogram 12/31/2021   External hemorrhoids 03/23/2021   Elevated LFTs 02/25/2021   Hypercholesterolemia 04/26/2019   History of basal cell cancer 01/26/2017   GAD (generalized anxiety disorder) 01/26/2017   Chronic insomnia 01/26/2017    Hypertension 01/26/2017   Chronic GERD 01/26/2017   Family history of colon cancer 01/26/2017   Prediabetes 01/26/2017   Perineal pain in female, chronic 01/26/2017   Moderate recurrent major depression (HCC) 11/03/2016   B12 deficiency 10/24/2016   Obstructive sleep apnea 11/02/2015   H/O adenomatous polyp of colon 11/02/2015   Basal cell carcinoma of skin of other parts of face 06/24/2014    ONSET DATE: date of referral 11/27/2023; pt reports symptoms began ~ 2 years ago (March 2023)  REFERRING DIAG: R41.89 (ICD-10-CM) - Subjective cognitive impairment   THERAPY DIAG:  Aphasia  Cognitive communication deficit  Rationale for Evaluation and Treatment Rehabilitation  SUBJECTIVE:   PERTINENT HISTORY and DIAGNOSTIC FINDINGS:   Pt is a 58 year female referred by neurologist for cognitive communication assessment and training. She presents with report of subjective word-finding difficulty and short term memory loss occurring around March 2023, anxiety, biploar disorders (diagnosed in 30s/early 64s) uncontrolled obstructive sleep apnea, vitamin D  deficiency, family history of dementia, chronic right frontal region headaches.   02/05/2022 - MRI 5 mm nodular dural-based enhancing focus overlying the high posterior left frontal lobe, likely reflecting a small meningioma. No significant mass effect upon the underlying brain parenchyma. Multifocal T2 FLAIR hyperintense signal abnormality within the cerebral white matter, overall mild but advanced for age. These signal changes are nonspecific and differential  considerations chronic small vessel ischemic disease, sequelae of chronic migraine headaches or sequelae of a prior infectious/inflammatory process, among others. The distribution would be atypical for demyelinating disease. No age advanced or lobar predominant parenchymal atrophy.   Results of Neuropsychological Evaluation (11/03/2022) Mrs. Stanek's test performance suggests mild, yet  significant, non-amnestic neurocognitive deficits in the session of presumed above average premorbid abilities. Organic etiology cannot be fully ruled out. Recent history of concussion (November 2023) could explain worsening focal deficits in executive functions, processing/learning, and fluid (nonverbal) reasoning. However, chronic medical factors (ex. Sleep disorder; post-viral syndrome/COVID-19 infection in 2022); along with persistent depression/anxiety, are core etiologies predating the concussion and have contributed to protracted recovery. Regardless, this does not appear to be a cortical neurodegenerative disease. Mrs. Marten presently meets the criteria for Mild Neurocognitive Disorder.   Normal beta-amyloid 42/40 ratio and normal concentration of pTau 181 and NfL (neurology note 07/09/2023) as well as APOE genetic testing that showed an E3/E3 variant which is not associated with increased risk of development of late-onset Alzheimer's Disease compared to general populations.    PAIN:  Are you having pain? No   FALLS: Has patient fallen in last 6 months?  No  LIVING ENVIRONMENT: Lives with: lives with their spouse Lives in: House/apartment  PLOF:  Level of assistance: Independent with ADLs, Independent with IADLs Employment: Full-time employment   PATIENT GOALS   to improve word finding and memory abilities  SUBJECTIVE STATEMENT: Pt reports improved confidence Pt accompanied by: self  OBJECTIVE:   TODAY'S TREATMENT:  Skilled treatment session focused on pt's cognitive communication goals. SLP facilitated session by providing the following interventions:  Pt reports that she and her husband played Taboo this weekend. She mentioned that she experienced a couple of instances of word misuse. Overall she reports increased confidence and improved skill following ST services. She was able to engage in 45 minute complex conversation regarding unknown information without instance of  word finding difficulty or dysfluencies.    PATIENT EDUCATION: Education details: see above Person educated: Patient Education method: Explanation Education comprehension: verbalized understanding   HOME EXERCISE PROGRAM:   N/A   GOALS:  Goals reviewed with patient? Yes  SHORT TERM GOALS: Target date: 10 sessions  Pt. will recall 3/4 memory strategies (w-write it, r-repeat, a-associate it, p-picture) and utilize strategies in structured and functional memory tasks to recall 90% of presented information with Mod I ability.  Baseline: Goal status: INITIAL: MET  With Mod I, patient will complete a semantic feature analysis with at least 4 relevant features for 5/7 target words to improve word-finding skills.  Baseline: Goal status: INITIAL: MET  2.  With Mod I, patient will name abstract words and phrases from description at 95% accuracy.   Baseline:  Goal status: INITIAL: MET   LONG TERM GOALS: Target date: 02/06/2024  With Mod I, patient will use strategies to improve memory for important information with 90% acc. Independently (ie., white board, daily planner/calendar, Apps on phone).   Baseline:  Goal status: INITIAL: MET  2.  With Mod I, patient will demonstrate knowledge of appropriate activities to support cognitive and  language function outside of ST.  Baseline:  Goal status: INITIAL: MET  3.  With Mod I, patient will participate in complex conversation at 95% accuracy to increase ability to communicate complex thoughts, feelings, and needs.  Baseline:  Goal status: INITIAL: MET   ASSESSMENT:  CLINICAL IMPRESSION: Patient is a 58 y.o. female who was seen today for a cognitive  communication treatment d/t report of subjective cognitive impairment.   Pt has been eager to participate and implement all strategies/recommendations made within skilled ST services. As a result, she has made great progress and met all STG/LTGs. At this time, all education as been  completed with pt to follow up with psychiatry.   PLAN: Pt has met maximal benefit from skilled ST services is appropriate for discharge. Pt in agreement.     Brittie Whisnant B. Rubbie, M.S., CCC-SLP, Tree surgeon Certified Brain Injury Specialist [facility]  [facility] Rehabilitation Services Office [phone] Ascom [phone] Fax [phone]

## 2023-12-19 NOTE — Telephone Encounter (Signed)
 Copied from CRM [id number]. Topic: Clinical - Order For Equipment >> Dec 19, 2023  9:06 AM Brandi Terrell wrote: Reason for CRM: Patient is requesting Dr. Isaiah to please place an order for a discontinuation of CPAP to [facility]. Patient will be needing to return her equipment soon but if the order is not there they will list her as going against medical advice

## 2024-01-08 ENCOUNTER — Other Ambulatory Visit (HOSPITAL_BASED_OUTPATIENT_CLINIC_OR_DEPARTMENT_OTHER)

## 2024-01-09 ENCOUNTER — Ambulatory Visit (HOSPITAL_BASED_OUTPATIENT_CLINIC_OR_DEPARTMENT_OTHER): Attending: Internal Medicine

## 2024-01-09 DIAGNOSIS — G4733 Obstructive sleep apnea (adult) (pediatric): Secondary | ICD-10-CM

## 2024-02-14 ENCOUNTER — Encounter: Payer: Self-pay | Admitting: Family Medicine

## 2024-02-15 ENCOUNTER — Other Ambulatory Visit: Payer: Self-pay | Admitting: Family Medicine

## 2024-02-15 DIAGNOSIS — E559 Vitamin D deficiency, unspecified: Secondary | ICD-10-CM

## 2024-02-15 MED ORDER — TIRZEPATIDE-WEIGHT MANAGEMENT 2.5 MG/0.5ML ~~LOC~~ SOLN: 2.5000 mg | SUBCUTANEOUS | 0 refills | Status: DC

## 2024-02-15 NOTE — Addendum Note (Signed)
 Addended by: AVELINA NO E on: 02/15/2024 01:27 PM   Modules accepted: Orders

## 2024-02-21 ENCOUNTER — Other Ambulatory Visit (HOSPITAL_COMMUNITY): Payer: Self-pay

## 2024-02-21 ENCOUNTER — Telehealth: Payer: Self-pay

## 2024-02-21 NOTE — Telephone Encounter (Signed)
 Pharmacy Patient Advocate Encounter   Received notification from Onbase that prior authorization for Zepbound  2.5 mg/0.5 ml pen is required/requested.   Insurance verification completed.   The patient is insured through [employer] .   Per test claim: PA required; PA submitted to above mentioned insurance via Latent Key/confirmation #/EOC [id number] Status is pending

## 2024-02-22 ENCOUNTER — Ambulatory Visit: Admitting: Family Medicine

## 2024-02-26 ENCOUNTER — Other Ambulatory Visit (HOSPITAL_COMMUNITY): Payer: Self-pay

## 2024-02-28 ENCOUNTER — Ambulatory Visit: Payer: Self-pay | Admitting: Family Medicine

## 2024-02-28 ENCOUNTER — Ambulatory Visit (INDEPENDENT_AMBULATORY_CARE_PROVIDER_SITE_OTHER)
Admission: RE | Admit: 2024-02-28 | Discharge: 2024-02-28 | Disposition: A | Discharge status: Home / Self Care | Source: Ambulatory Visit | Attending: Family Medicine | Admitting: Family Medicine

## 2024-02-28 ENCOUNTER — Ambulatory Visit (INDEPENDENT_AMBULATORY_CARE_PROVIDER_SITE_OTHER): Payer: Self-pay | Admitting: Family Medicine

## 2024-02-28 ENCOUNTER — Other Ambulatory Visit: Payer: Self-pay

## 2024-02-28 ENCOUNTER — Encounter: Payer: Self-pay | Admitting: Family Medicine

## 2024-02-28 VITALS — BP 128/82 | HR 69 | Temp 99.4°F | Ht 64.0 in | Wt 199.2 lb

## 2024-02-28 DIAGNOSIS — R5382 Chronic fatigue, unspecified: Secondary | ICD-10-CM

## 2024-02-28 DIAGNOSIS — E559 Vitamin D deficiency, unspecified: Secondary | ICD-10-CM

## 2024-02-28 DIAGNOSIS — F5104 Psychophysiologic insomnia: Secondary | ICD-10-CM | POA: Diagnosis not present

## 2024-02-28 DIAGNOSIS — I10 Essential (primary) hypertension: Secondary | ICD-10-CM

## 2024-02-28 DIAGNOSIS — G4733 Obstructive sleep apnea (adult) (pediatric): Secondary | ICD-10-CM

## 2024-02-28 DIAGNOSIS — R0602 Shortness of breath: Secondary | ICD-10-CM

## 2024-02-28 DIAGNOSIS — I5189 Other ill-defined heart diseases: Secondary | ICD-10-CM | POA: Insufficient documentation

## 2024-02-28 DIAGNOSIS — R600 Localized edema: Secondary | ICD-10-CM

## 2024-02-28 DIAGNOSIS — F331 Major depressive disorder, recurrent, moderate: Secondary | ICD-10-CM

## 2024-02-28 LAB — VITAMIN D 25 HYDROXY (VIT D DEFICIENCY, FRACTURES): VITD: 24.3 ng/mL — ABNORMAL LOW (ref 30.00–100.00)

## 2024-02-28 MED ORDER — VALSARTAN 320 MG PO TABS: 320.0000 mg | ORAL_TABLET | Freq: Every day | ORAL | 11 refills | Status: AC

## 2024-02-28 MED ORDER — METOPROLOL SUCCINATE ER 50 MG PO TB24: 50.0000 mg | ORAL_TABLET | Freq: Every day | ORAL | 11 refills | Status: AC

## 2024-02-28 MED ORDER — TRIAMTERENE-HCTZ 37.5-25 MG PO TABS: 1.0000 | ORAL_TABLET | Freq: Every day | ORAL | 11 refills | Status: AC

## 2024-02-28 MED ORDER — AMLODIPINE BESYLATE 10 MG PO TABS: 10.0000 mg | ORAL_TABLET | Freq: Every day | ORAL | 11 refills | Status: DC

## 2024-02-28 NOTE — Assessment & Plan Note (Addendum)
 Chronic, good control but medication side effects could be causing some of her current symptoms including amlodipine  possibly contributing to peripheral swelling and beta-blocker contributing to dyspnea on exertion and decreased endurance.  For now we will have her continue amlodipine  10 mg daily and valsartan  320 mg daily.  Did send in 2 separate prescriptions per her insurance request. We will try treating fluid overload with Maxide 25/37.5 mg daily while also trying to decrease metoprolol  to 50 mg daily.  I am hesitant to make a rapid change with the metoprolol  as it may be secondarily helping with her anxiety.  Close follow-up in 2 weeks for repeat blood pressure evaluation and assessment of peripheral swelling.  May still need to decrease/change amlodipine  if peripheral swelling continuing versus trial of decreasing metoprolol  further to see if energy and breathing improves.

## 2024-02-28 NOTE — Assessment & Plan Note (Addendum)
 Chronic, moderate control  Seeing psychologist.  On lamotrigine  200 mg daily and liothyronine ( to enhance the lamictal ) per past  psychiatrist, Dr. Myerson On zolpidem  5 mg p.o. nightly  Fatigue in part could be side effect of her psychiatric medications.

## 2024-02-28 NOTE — Assessment & Plan Note (Signed)
 Chronic, dyspnea on exertion evaluated by cardiology with no clear cardiac source. She does have some evidence of mild fluid overload today and echo did show grade 1 diastolic dysfunction.  Pulmonary has been focusing on obstructive sleep apnea and does not appear to have addressed shortness of breath much. Will evaluate with repeat chest x-ray and consider chest CT. May eventually need pulmonary function tests as well.  On review of her medications I wonder if her beta-blocker could be contributing to some breathing issues as well as fatigue.  We will try treating fluid overload with Maxide 25/37.5 mg daily while also trying to decrease metoprolol  to 50 mg daily.

## 2024-02-28 NOTE — Progress Notes (Signed)
 Patient ID: Brandi Terrell, female    DOB: [birth year], 58 y.o.   MRN: [id number]  This visit was conducted in person.  BP 128/82   Pulse 69   Temp 99.4 F (37.4 C) (Oral)   Ht 5' 4 (1.626 m)   Wt 199 lb 3.2 oz (90.4 kg)   LMP  (LMP Unknown)   SpO2 95%   BMI 34.19 kg/m    CC:  Chief Complaint  Patient presents with   Fatigue    Ongoing for months to years    Shortness of Breath    Daily. Walking from the lobby to the room she feels a little winded. Feels like sometimes she can not catch her breath   Joint Swelling    Noticed it over the past month.     Subjective:   HPI: Brandi Terrell is a 58 y.o. female presenting on 02/28/2024 for Fatigue (Ongoing for months to years ), Shortness of Breath (Daily. Walking from the lobby to the room she feels a little winded. Feels like sometimes she can not catch her breath), and Joint Swelling (Noticed it over the past month. )  Chronic fatigue, ongoing for over  2 years Felt previously to be in part from vitamin deficiency, major depression, sleep apnea and chronic insomnia.   Chronic SOB  ( with walking)and chronic fatigue... worsening.  Still noting trouble breathing with bending over, uncomfortable feeling in lower chest and around upper abdomen. Ongoing for 2 years... noted also when was at lower weight No wheeze, cough, congestion.    She has also noted  peripheral swelling over the past month in lower legs.   History of sleep apnea  not able to use CPAP, chronic insomnia Malignant melanoma Moderate recurrent major depression:   In past SOB evaluated by cardiology and pulmonary  Last  OV with Dr. Isaiah Pulm: 11/2023 reviewed:  Dx severe sleep apnea... was non compliant with CPAP at that time.. referred to mask desensitization program. Also she is lloking into dental appliance. 05/2023 ECHO and EKG performed at cardiology Dr. Nickolas -Daleen:  Grade 1 diastolic dysfnction, Normal systolic function, no vale issues, no LVH   11/2024CA calcium  CT: Calcium  score 0   Nml CXR 2023  Also referred to Neurology for  aphasia,  mild cognitive impairment  MRI brain 11/2023:  Stable 5 mm round area of enhancement in the left frontal high convexity. I favor this to be mild asymmetry of a small benign venous structure rather than a small benign meningioma     Given Zepbound  for weight loss ... Too expensive.. looking into [facility] Wt Readings from Last 3 Encounters:  02/28/24 199 lb 3.2 oz (90.4 kg)  12/13/23 194 lb 12.8 oz (88.4 kg)  11/22/23 193 lb 6.4 oz (87.7 kg)    HTN: on amlodipine /valsartan  10/320 and metoprolol  BP Readings from Last 3 Encounters:  02/28/24 128/82  12/13/23 122/78  11/22/23 134/74    Relevant past medical, surgical, family and social history reviewed and updated as indicated. Interim medical history since our last visit reviewed. Allergies and medications reviewed and updated. Outpatient Medications Prior to Visit  Medication Sig Dispense Refill   atorvastatin  (LIPITOR) 20 MG tablet Take 1 tablet (20 mg total) by mouth daily. 90 tablet 3   Calcium -Magnesium-Vitamin D  300-20-200 MG-MG-UNIT CHEW Chew by mouth.     lamoTRIgine  (LAMICTAL ) 200 MG tablet Take 200 mg by mouth daily.     omeprazole  (PRILOSEC) 40 MG capsule Take 1 capsule (  40 mg total) by mouth daily. 90 capsule 2   Vitamin D , Ergocalciferol , (DRISDOL ) 1.25 MG (50000 UNIT) CAPS capsule Take 50,000 Units by mouth once a week.     amLODipine -valsartan  (EXFORGE ) 10-320 MG tablet Take 1 tablet by mouth daily. 90 tablet 1   metoprolol  succinate (TOPROL -XL) 100 MG 24 hr tablet Take 1 tablet (100 mg total) by mouth daily. Take with or immediately following a meal. 90 tablet 0   tirzepatide  (ZEPBOUND ) 2.5 MG/0.5ML injection vial Inject 2.5 mg into the skin once a week. (Patient not taking: Reported on 02/28/2024) 2 mL 0   ALPRAZolam  (XANAX ) 0.25 MG tablet 1-2 tabs prior to procedure (Patient not taking: Reported on 12/13/2023) 2 tablet 0    benzonatate  (TESSALON ) 200 MG capsule Take 1 capsule (200 mg total) by mouth 2 (two) times daily as needed for cough. (Patient not taking: Reported on 12/13/2023) 20 capsule 0   LORazepam (ATIVAN) 0.5 MG tablet Take 0.5 mg by mouth.     zolpidem  (AMBIEN ) 10 MG tablet Take 10 mg by mouth at bedtime.     No facility-administered medications prior to visit.     Per HPI unless specifically indicated in ROS section below Review of Systems  Constitutional:  Negative for fatigue and fever.  HENT:  Negative for congestion.   Eyes:  Negative for pain.  Respiratory:  Negative for cough and shortness of breath.   Cardiovascular:  Negative for chest pain, palpitations and leg swelling.  Gastrointestinal:  Negative for abdominal pain.  Genitourinary:  Negative for dysuria and vaginal bleeding.  Musculoskeletal:  Negative for back pain.  Neurological:  Negative for syncope, light-headedness and headaches.  Psychiatric/Behavioral:  Negative for dysphoric mood.    Objective:  BP 128/82   Pulse 69   Temp 99.4 F (37.4 C) (Oral)   Ht 5' 4 (1.626 m)   Wt 199 lb 3.2 oz (90.4 kg)   LMP  (LMP Unknown)   SpO2 95%   BMI 34.19 kg/m   Wt Readings from Last 3 Encounters:  02/28/24 199 lb 3.2 oz (90.4 kg)  12/13/23 194 lb 12.8 oz (88.4 kg)  11/22/23 193 lb 6.4 oz (87.7 kg)      Physical Exam Constitutional:      General: She is not in acute distress.    Appearance: Normal appearance. She is well-developed. She is not ill-appearing or toxic-appearing.  HENT:     Head: Normocephalic.     Right Ear: Hearing, tympanic membrane, ear canal and external ear normal. Tympanic membrane is not erythematous, retracted or bulging.     Left Ear: Hearing, tympanic membrane, ear canal and external ear normal. Tympanic membrane is not erythematous, retracted or bulging.     Nose: No mucosal edema or rhinorrhea.     Right Sinus: No maxillary sinus tenderness or frontal sinus tenderness.     Left Sinus: No maxillary  sinus tenderness or frontal sinus tenderness.     Mouth/Throat:     Pharynx: Uvula midline.  Eyes:     General: Lids are normal. Lids are everted, no foreign bodies appreciated.     Conjunctiva/sclera: Conjunctivae normal.     Pupils: Pupils are equal, round, and reactive to light.  Neck:     Thyroid : No thyroid  mass or thyromegaly.     Vascular: No carotid bruit.     Trachea: Trachea normal.  Cardiovascular:     Rate and Rhythm: Normal rate and regular rhythm.     Pulses: Normal pulses.  Heart sounds: Normal heart sounds, S1 normal and S2 normal. No murmur heard.    No friction rub. No gallop.  Pulmonary:     Effort: Pulmonary effort is normal. No tachypnea or respiratory distress.     Breath sounds: Normal breath sounds. No decreased breath sounds, wheezing, rhonchi or rales.  Abdominal:     General: Bowel sounds are normal.     Palpations: Abdomen is soft.     Tenderness: There is no abdominal tenderness.  Musculoskeletal:     Cervical back: Normal range of motion and neck supple.  Skin:    General: Skin is warm and dry.     Findings: No rash.  Neurological:     Mental Status: She is alert.  Psychiatric:        Attention and Perception: Attention normal.        Mood and Affect: Mood is not anxious or depressed. Affect is flat.        Speech: Speech normal.        Behavior: Behavior is slowed and withdrawn. Behavior is cooperative.        Thought Content: Thought content normal.        Judgment: Judgment normal.       Results for orders placed or performed in visit on 11/22/23  POCT Urinalysis Dipstick (Automated)   Collection Time: 11/22/23 10:18 AM  Result Value Ref Range   Color, UA AMBER    Clarity, UA Clear    Glucose, UA Negative Negative   Bilirubin, UA 1+    Ketones, UA negative    Spec Grav, UA >=1.030 (A) 1.010 - 1.025   Blood, UA negative    pH, UA 5.5 5.0 - 8.0   Protein, UA Negative Negative   Urobilinogen, UA 0.2 0.2 or 1.0 E.U./dL   Nitrite,  UA negative    Leukocytes, UA Negative Negative  CBC with Differential/Platelet   Collection Time: 11/22/23 10:32 AM  Result Value Ref Range   WBC 6.3 4.0 - 10.5 K/uL   RBC 4.55 3.87 - 5.11 Mil/uL   Hemoglobin 12.2 12.0 - 15.0 g/dL   HCT 62.2 63.9 - 53.9 %   MCV 82.9 78.0 - 100.0 fl   MCHC 32.3 30.0 - 36.0 g/dL   RDW 85.2 88.4 - 84.4 %   Platelets 327.0 150.0 - 400.0 K/uL   Neutrophils Relative % 49.1 43.0 - 77.0 %   Lymphocytes Relative 41.9 12.0 - 46.0 %   Monocytes Relative 7.1 3.0 - 12.0 %   Eosinophils Relative 1.5 0.0 - 5.0 %   Basophils Relative 0.4 0.0 - 3.0 %   Neutro Abs 3.1 1.4 - 7.7 K/uL   Lymphs Abs 2.6 0.7 - 4.0 K/uL   Monocytes Absolute 0.4 0.1 - 1.0 K/uL   Eosinophils Absolute 0.1 0.0 - 0.7 K/uL   Basophils Absolute 0.0 0.0 - 0.1 K/uL  Vitamin B12   Collection Time: 11/22/23 10:32 AM  Result Value Ref Range   Vitamin B-12 252 211 - 911 pg/mL  Lipid panel   Collection Time: 11/22/23 10:32 AM  Result Value Ref Range   Cholesterol 184 0 - 200 mg/dL   Triglycerides 830.9 (H) 0.0 - 149.0 mg/dL   HDL 48.69 >60.99 mg/dL   VLDL 66.1 0.0 - 59.9 mg/dL   LDL Cholesterol 99 0 - 99 mg/dL   Total CHOL/HDL Ratio 4    NonHDL 132.87   Hemoglobin A1c   Collection Time: 11/22/23 10:32 AM  Result Value Ref Range  Hgb A1c MFr Bld 6.3 4.6 - 6.5 %  Comprehensive metabolic panel   Collection Time: 11/22/23 10:32 AM  Result Value Ref Range   Sodium 140 135 - 145 mEq/L   Potassium 4.4 3.5 - 5.1 mEq/L   Chloride 105 96 - 112 mEq/L   CO2 29 19 - 32 mEq/L   Glucose, Bld 100 (H) 70 - 99 mg/dL   BUN 24 (H) 6 - 23 mg/dL   Creatinine, Ser 9.23 0.40 - 1.20 mg/dL   Total Bilirubin 0.6 0.2 - 1.2 mg/dL   Alkaline Phosphatase 132 (H) 39 - 117 U/L   AST 20 0 - 37 U/L   ALT 21 0 - 35 U/L   Total Protein 7.1 6.0 - 8.3 g/dL   Albumin 4.5 3.5 - 5.2 g/dL   GFR 14.71 >39.99 mL/min   Calcium  10.6 (H) 8.4 - 10.5 mg/dL    Assessment and Plan  SOB (shortness of breath) Assessment &  Plan: Chronic, dyspnea on exertion evaluated by cardiology with no clear cardiac source. She does have some evidence of mild fluid overload today and echo did show grade 1 diastolic dysfunction.  Pulmonary has been focusing on obstructive sleep apnea and does not appear to have addressed shortness of breath much. Will evaluate with repeat chest x-ray and consider chest CT. May eventually need pulmonary function tests as well.  On review of her medications I wonder if her beta-blocker could be contributing to some breathing issues as well as fatigue.  We will try treating fluid overload with Maxide 25/37.5 mg daily while also trying to decrease metoprolol  to 50 mg daily.   Orders: -     DG Chest 2 View; Future  Chronic insomnia Assessment & Plan: Chronic, patient reports that this has improved some recently.    Obstructive sleep apnea Assessment & Plan: Chronic, untreated. Patient seen by mask desensitization program to get better fitting mask.  Found 1 that works better but given insurance changes has not been able to get yet. She has also been fitted for an oral appliance which is in progress with her dentist.  She is looking into this option as well.  Discussed in detail that fatigue and cognitive impairment, are not likely to improve until she treats her severe sleep apnea.   Peripheral edema  Chronic fatigue  Vitamin D  deficiency Assessment & Plan: Due for re-eval.  Orders: -     VITAMIN D  25 Hydroxy (Vit-D Deficiency, Fractures)  Moderate recurrent major depression (HCC) Assessment & Plan: Chronic, moderate control  Seeing psychologist.  On lamotrigine  200 mg daily and liothyronine ( to enhance the lamictal ) per past  psychiatrist, Dr. Myerson On zolpidem  5 mg p.o. nightly  Fatigue in part could be side effect of her psychiatric medications.   Primary hypertension Assessment & Plan: Chronic, good control but medication side effects could be causing some of  her current symptoms including amlodipine  possibly contributing to peripheral swelling and beta-blocker contributing to dyspnea on exertion and decreased endurance.  For now we will have her continue amlodipine  10 mg daily and valsartan  320 mg daily.  Did send in 2 separate prescriptions per her insurance request. We will try treating fluid overload with Maxide 25/37.5 mg daily while also trying to decrease metoprolol  to 50 mg daily.  I am hesitant to make a rapid change with the metoprolol  as it may be secondarily helping with her anxiety.  Close follow-up in 2 weeks for repeat blood pressure evaluation and assessment of peripheral  swelling.  May still need to decrease/change amlodipine  if peripheral swelling continuing versus trial of decreasing metoprolol  further to see if energy and breathing improves.   Grade I diastolic dysfunction  Other orders -     Metoprolol  Succinate ER; Take 1 tablet (50 mg total) by mouth daily. Take with or immediately following a meal.  Dispense: 30 tablet; Refill: 11 -     Triamterene -HCTZ; Take 1 tablet by mouth daily.  Dispense: 30 tablet; Refill: 11 -     Valsartan ; Take 1 tablet (320 mg total) by mouth daily.  Dispense: 30 tablet; Refill: 11 -     amLODIPine  Besylate; Take 1 tablet (10 mg total) by mouth daily.  Dispense: 30 tablet; Refill: 11    Return in about 2 weeks (around 03/13/2024) for  follow up SOB, fatgiue, swelling.   Greig Ring, MD

## 2024-02-28 NOTE — Assessment & Plan Note (Signed)
 Chronic, patient reports that this has improved some recently.

## 2024-02-28 NOTE — Assessment & Plan Note (Signed)
 Due for re-eval.

## 2024-02-28 NOTE — Assessment & Plan Note (Signed)
 Chronic, untreated. Patient seen by mask desensitization program to get better fitting mask.  Found 1 that works better but given insurance changes has not been able to get yet. She has also been fitted for an oral appliance which is in progress with her dentist.  She is looking into this option as well.  Discussed in detail that fatigue and cognitive impairment, are not likely to improve until she treats her severe sleep apnea.

## 2024-02-28 NOTE — Patient Instructions (Addendum)
 Most important is setting up oral appliance or better fit CPAP.  Consider CT chest to evaluate shortness of breath  if Chest X-ray unremarkable.   Decrease metoprolol  to  50 mg daily.  We will start a fluid medication hydrochlorothiazide/triamterene   1 tablet daily.   Look into [facility] for Zepbound ... let me know if you want me to send a prescription in and they will send it direct to your house/ bypass insurance.

## 2024-03-06 ENCOUNTER — Other Ambulatory Visit: Payer: Self-pay | Admitting: Family Medicine

## 2024-03-06 MED ORDER — VITAMIN D (ERGOCALCIFEROL) 1.25 MG (50000 UNIT) PO CAPS: 50000.0000 [IU] | ORAL_CAPSULE | ORAL | 3 refills | Status: AC

## 2024-03-13 ENCOUNTER — Ambulatory Visit: Admitting: Family Medicine

## 2024-03-13 VITALS — BP 124/74 | HR 68 | Temp 98.8°F | Ht 64.0 in | Wt 196.4 lb

## 2024-03-13 DIAGNOSIS — E66811 Obesity, class 1: Secondary | ICD-10-CM

## 2024-03-13 DIAGNOSIS — Z23 Encounter for immunization: Secondary | ICD-10-CM | POA: Diagnosis not present

## 2024-03-13 DIAGNOSIS — G4733 Obstructive sleep apnea (adult) (pediatric): Secondary | ICD-10-CM

## 2024-03-13 DIAGNOSIS — R0602 Shortness of breath: Secondary | ICD-10-CM

## 2024-03-13 DIAGNOSIS — Z6833 Body mass index (BMI) 33.0-33.9, adult: Secondary | ICD-10-CM

## 2024-03-13 DIAGNOSIS — I10 Essential (primary) hypertension: Secondary | ICD-10-CM

## 2024-03-13 DIAGNOSIS — R5382 Chronic fatigue, unspecified: Secondary | ICD-10-CM | POA: Insufficient documentation

## 2024-03-13 DIAGNOSIS — E6609 Other obesity due to excess calories: Secondary | ICD-10-CM

## 2024-03-13 MED ORDER — TIRZEPATIDE-WEIGHT MANAGEMENT 2.5 MG/0.5ML ~~LOC~~ SOLN: 2.5000 mg | SUBCUTANEOUS | 0 refills | Status: DC

## 2024-03-13 NOTE — Assessment & Plan Note (Signed)
 Chronic, discussed that some of her shortness of breath given names to be worse with leaning over may be related to central obesity. We discussed options for treatment.  We will start Zepbound  2.5 mg weekly through [facility]. She does have a history of slightly elevated lipase last year and concern for possible pancreatitis but abdominal CT follow-up was normal.  So no definitive history of pancreatitis. Reviewed in detail signs and symptoms of pancreatitis.  She will contact me in the next month with response to Zepbound  as well as any possible side effects.  At that time we can consider increase to 5 mg weekly.

## 2024-03-13 NOTE — Progress Notes (Signed)
 Patient ID: Brandi Terrell, female    DOB: [birth year], 58 y.o.   MRN: [id number]  This visit was conducted in person.  BP 124/74   Pulse 68   Temp 98.8 F (37.1 C) (Oral)   Ht 5' 4 (1.626 m)   Wt 196 lb 6.4 oz (89.1 kg)   LMP  (LMP Unknown)   SpO2 96%   BMI 33.71 kg/m    CC:  Chief Complaint  Patient presents with   Follow-up    Shortness of breath, fatigue and swelling    Subjective:   HPI: Brandi Terrell is a 58 y.o. female presenting on 03/13/2024 for Follow-up (Shortness of breath, fatigue and swelling)  HPI at last office visit: Chronic fatigue, ongoing for over  2 years Felt previously to be in part from vitamin deficiency, major depression, sleep apnea and chronic insomnia.   Chronic SOB  ( with walking)and chronic fatigue... worsening.  Still noting trouble breathing with bending over, uncomfortable feeling in lower chest and around upper abdomen. Ongoing for 2 years... noted also when was at lower weight No wheeze, cough, congestion.   She has also noted  peripheral swelling over the past month in lower legs.   History of sleep apnea  not able to use CPAP, chronic insomnia Malignant melanoma Moderate recurrent major depression:   In past SOB evaluated by cardiology and pulmonary  Last  OV with Dr. Isaiah Pulm: 11/2023 reviewed:  Dx severe sleep apnea... was non compliant with CPAP at that time.. referred to mask desensitization program. Also she is lloking into dental appliance. 05/2023 ECHO and EKG performed at cardiology Dr. Nickolas -Etang:  Grade 1 diastolic dysfnction, Normal systolic function, no vale issues, no LVH  11/2024CA calcium  CT: Calcium  score 0   At last OV 02/28/2024 CXR done : No acute cardiopulmonary disease seen. Given chronic dyspnea on exertion and fatigue: Considered if beta-blocker could be contributing to these issues. Given diastolic dysfunction seen on echo started trial of Maxide 25/37.5 mg daily while also trying to decrease  metoprolol  to 50 mg daily. Also of note psychiatric medication as well as untreated sleep apnea likely contributing to fatigue.  Today she reports she is having  no further selling in ankles, much better.  Still very fatigued, minimal change in SOB.   BP running 125-129/75-92 HR 70-80  BP Readings from Last 3 Encounters:  03/13/24 124/74  02/28/24 128/82  12/13/23 122/78   Wt Readings from Last 3 Encounters:  03/13/24 196 lb 6.4 oz (89.1 kg)  02/28/24 199 lb 3.2 oz (90.4 kg)  12/13/23 194 lb 12.8 oz (88.4 kg)     Relevant past medical, surgical, family and social history reviewed and updated as indicated. Interim medical history since our last visit reviewed. Allergies and medications reviewed and updated. Outpatient Medications Prior to Visit  Medication Sig Dispense Refill   amLODipine  (NORVASC ) 10 MG tablet Take 1 tablet (10 mg total) by mouth daily. 30 tablet 11   atorvastatin  (LIPITOR) 20 MG tablet Take 1 tablet (20 mg total) by mouth daily. 90 tablet 3   lamoTRIgine  (LAMICTAL ) 200 MG tablet Take 200 mg by mouth daily.     MAGNESIUM CITRATE PO Take by mouth.     metoprolol  succinate (TOPROL -XL) 50 MG 24 hr tablet Take 1 tablet (50 mg total) by mouth daily. Take with or immediately following a meal. 30 tablet 11   omeprazole  (PRILOSEC) 40 MG capsule Take 1 capsule (40 mg total) by mouth  daily. 90 capsule 2   sertraline (ZOLOFT) 50 MG tablet Take 50 mg by mouth daily.     traZODone (DESYREL) 50 MG tablet Take 50-100 mg by mouth at bedtime as needed.     triamterene -hydrochlorothiazide (MAXZIDE-25) 37.5-25 MG tablet Take 1 tablet by mouth daily. 30 tablet 11   valsartan  (DIOVAN ) 320 MG tablet Take 1 tablet (320 mg total) by mouth daily. 30 tablet 11   vitamin B-12 (CYANOCOBALAMIN ) 100 MCG tablet Take 100 mcg by mouth daily.     Vitamin D , Ergocalciferol , (DRISDOL ) 1.25 MG (50000 UNIT) CAPS capsule Take 1 capsule (50,000 Units total) by mouth once a week. 12 capsule 3    tirzepatide  (ZEPBOUND ) 2.5 MG/0.5ML injection vial Inject 2.5 mg into the skin once a week. 2 mL 0   Calcium -Magnesium-Vitamin D  300-20-200 MG-MG-UNIT CHEW Chew by mouth.     No facility-administered medications prior to visit.     Per HPI unless specifically indicated in ROS section below Review of Systems  Constitutional:  Negative for fatigue and fever.  HENT:  Negative for congestion.   Eyes:  Negative for pain.  Respiratory:  Negative for cough and shortness of breath.   Cardiovascular:  Negative for chest pain, palpitations and leg swelling.  Gastrointestinal:  Negative for abdominal pain.  Genitourinary:  Negative for dysuria and vaginal bleeding.  Musculoskeletal:  Negative for back pain.  Neurological:  Negative for syncope, light-headedness and headaches.  Psychiatric/Behavioral:  Negative for dysphoric mood.    Objective:  BP 124/74   Pulse 68   Temp 98.8 F (37.1 C) (Oral)   Ht 5' 4 (1.626 m)   Wt 196 lb 6.4 oz (89.1 kg)   LMP  (LMP Unknown)   SpO2 96%   BMI 33.71 kg/m   Wt Readings from Last 3 Encounters:  03/13/24 196 lb 6.4 oz (89.1 kg)  02/28/24 199 lb 3.2 oz (90.4 kg)  12/13/23 194 lb 12.8 oz (88.4 kg)      Physical Exam Constitutional:      General: She is not in acute distress.    Appearance: Normal appearance. She is well-developed. She is not ill-appearing or toxic-appearing.  HENT:     Head: Normocephalic.     Right Ear: Hearing, tympanic membrane, ear canal and external ear normal. Tympanic membrane is not erythematous, retracted or bulging.     Left Ear: Hearing, tympanic membrane, ear canal and external ear normal. Tympanic membrane is not erythematous, retracted or bulging.     Nose: No mucosal edema or rhinorrhea.     Right Sinus: No maxillary sinus tenderness or frontal sinus tenderness.     Left Sinus: No maxillary sinus tenderness or frontal sinus tenderness.     Mouth/Throat:     Pharynx: Uvula midline.  Eyes:     General: Lids are  normal. Lids are everted, no foreign bodies appreciated.     Conjunctiva/sclera: Conjunctivae normal.     Pupils: Pupils are equal, round, and reactive to light.  Neck:     Thyroid : No thyroid  mass or thyromegaly.     Vascular: No carotid bruit.     Trachea: Trachea normal.  Cardiovascular:     Rate and Rhythm: Normal rate and regular rhythm.     Pulses: Normal pulses.     Heart sounds: Normal heart sounds, S1 normal and S2 normal. No murmur heard.    No friction rub. No gallop.  Pulmonary:     Effort: Pulmonary effort is normal. No tachypnea or respiratory  distress.     Breath sounds: Normal breath sounds. No decreased breath sounds, wheezing, rhonchi or rales.  Abdominal:     General: Bowel sounds are normal.     Palpations: Abdomen is soft.     Tenderness: There is no abdominal tenderness.  Musculoskeletal:     Cervical back: Normal range of motion and neck supple.  Skin:    General: Skin is warm and dry.     Findings: No rash.  Neurological:     Mental Status: She is alert.  Psychiatric:        Attention and Perception: Attention normal.        Mood and Affect: Mood is not anxious or depressed. Affect is flat.        Speech: Speech normal.        Behavior: Behavior is slowed and withdrawn. Behavior is cooperative.        Thought Content: Thought content normal.        Judgment: Judgment normal.       Results for orders placed or performed in visit on 02/28/24  VITAMIN D  25 Hydroxy (Vit-D Deficiency, Fractures)   Collection Time: 02/28/24  9:37 AM  Result Value Ref Range   VITD 24.30 (L) 30.00 - 100.00 ng/mL    Assessment and Plan  SOB (shortness of breath) Assessment & Plan: Chronic, she reports significant difficulty in air movement as well as difficulty in breathing especially when bending over. Possibly secondary to obesity. I will refer her back to pulmonary for possible pulmonary function test. Chest x-ray was unremarkable. Lab work and cardiac evaluation  has been unremarkable.  Orders: -     Pulmonary Visit  Primary hypertension Assessment & Plan: Chronic, tolerable control despite decreasing metoprolol  to 50 mg daily and adding Maxide daily. She has noted significant improvement of peripheral swelling but unfortunately no improvement with fatigue or shortness of breath.   Chronic fatigue  Obstructive sleep apnea Assessment & Plan: Chronic, untreated. Patient seen by mask desensitization program to get better fitting mask.  Found 1 that works better but given insurance changes has not been able to get yet. She has also been fitted for an oral appliance which is in progress with her dentist.  She is looking into this option as well.  Discussed in detail that fatigue and cognitive impairment, are not likely to improve until she treats her severe sleep apnea. Unfortunately she still has not done anything about treating sleep apnea.   Class 1 obesity due to excess calories with serious comorbidity and body mass index (BMI) of 33.0 to 33.9 in adult Assessment & Plan: Chronic, discussed that some of her shortness of breath given names to be worse with leaning over may be related to central obesity. We discussed options for treatment.  We will start Zepbound  2.5 mg weekly through [employer]. She does have a history of slightly elevated lipase last year and concern for possible pancreatitis but abdominal CT follow-up was normal.  So no definitive history of pancreatitis. Reviewed in detail signs and symptoms of pancreatitis.  She will contact me in the next month with response to Zepbound  as well as any possible side effects.  At that time we can consider increase to 5 mg weekly.   Other orders -     Tirzepatide -Weight Management; Inject 2.5 mg into the skin once a week.  Dispense: 2 mL; Refill: 0     Return in about 6 months (around 09/10/2024) for annual physical with fasting  labs prior.   Greig Ring, MD

## 2024-03-13 NOTE — Assessment & Plan Note (Signed)
 Chronic, she reports significant difficulty in air movement as well as difficulty in breathing especially when bending over. Possibly secondary to obesity. I will refer her back to pulmonary for possible pulmonary function test. Chest x-ray was unremarkable. Lab work and cardiac evaluation has been unremarkable.

## 2024-03-13 NOTE — Assessment & Plan Note (Signed)
 Chronic, tolerable control despite decreasing metoprolol  to 50 mg daily and adding Maxide daily. She has noted significant improvement of peripheral swelling but unfortunately no improvement with fatigue or shortness of breath.

## 2024-03-13 NOTE — Assessment & Plan Note (Signed)
 Chronic, untreated. Patient seen by mask desensitization program to get better fitting mask.  Found 1 that works better but given insurance changes has not been able to get yet. She has also been fitted for an oral appliance which is in progress with her dentist.  She is looking into this option as well.  Discussed in detail that fatigue and cognitive impairment, are not likely to improve until she treats her severe sleep apnea. Unfortunately she still has not done anything about treating sleep apnea.

## 2024-03-13 NOTE — Addendum Note (Signed)
 Addended by: TRUDY DAVENE CROME on: 03/13/2024 12:30 PM   Modules accepted: Orders

## 2024-03-27 ENCOUNTER — Encounter: Payer: Self-pay | Admitting: Family Medicine

## 2024-03-28 ENCOUNTER — Other Ambulatory Visit: Payer: Self-pay | Admitting: Family Medicine

## 2024-04-03 ENCOUNTER — Encounter: Payer: Self-pay | Admitting: Internal Medicine

## 2024-04-03 ENCOUNTER — Ambulatory Visit: Admitting: Internal Medicine

## 2024-04-03 VITALS — BP 120/80 | HR 69 | Temp 98.7°F | Ht 63.0 in | Wt 197.4 lb

## 2024-04-03 DIAGNOSIS — R5381 Other malaise: Secondary | ICD-10-CM | POA: Diagnosis not present

## 2024-04-03 DIAGNOSIS — E669 Obesity, unspecified: Secondary | ICD-10-CM | POA: Diagnosis not present

## 2024-04-03 DIAGNOSIS — I5030 Unspecified diastolic (congestive) heart failure: Secondary | ICD-10-CM

## 2024-04-03 DIAGNOSIS — G4733 Obstructive sleep apnea (adult) (pediatric): Secondary | ICD-10-CM

## 2024-04-03 DIAGNOSIS — R0602 Shortness of breath: Secondary | ICD-10-CM

## 2024-04-03 NOTE — Progress Notes (Signed)
 [facility] [place] [facility] Consultation      Date: 04/03/2024,   MRN# [id number] Brandi Terrell Feb 23, 1966    Synopsis:  patient seen for chronic SOB and DOE with voice changes and vocal cord weakness with recurrent bouts of URI's chronic vocal cord dysfunction Patient previously diagnosed with chronic vocal cord dysfunction Seems to have resolved was treated at [facility] Patient previous use of oxygen at nighttime but noncompliant with oxygen and CPAP therapy She has a history of grade 1 diastolic dysfunction based on echocardiogram Patient was diagnosed with pancreatitis Subsequently had a CT calcium  score Patient states that she falls asleep subsequently bites her tongue   TESTS Previous sleep study 2019 HST showed severe sleep apnea AHI of 31 Patient attempted and tried CPAP machine however did not tolerated Patient did not follow-up Patient repeat HST July 20, 2023 shows AHI of 18 previously diagnosed with chronic vocal cord dysfunction   TESTS  CHIEF COMPLAINT:  Follow-up assessment for OSA   HISTORY OF PRESENT ILLNESS  Previous sleep study 2019 HST showed severe sleep apnea AHI of 31 Patient attempted and tried CPAP machine however did not tolerated Patient did not follow-up Patient is noncompliant at this time due to mask issues  Patient did not follow-up with oral device Patient is noncompliant due to mask issues  No exacerbation at this time No evidence of heart failure at this time No evidence or signs of infection at this time No respiratory distress No fevers, chills, nausea, vomiting, diarrhea No evidence of lower extremity edema No evidence hemoptysis  Echo shows impaired grade 1 diastolic dysfunction December 2024 Recommend follow-up cardiology  Patient states she has increased shortness of breath and dyspnea on exertion especially when she bends over to pick up things I explained to her that her obesity and restrictive physiology is causing  her symptoms Recommend weight loss Patient currently on Zepbound   Patient and deconditioned state  Current Medication:  Current Outpatient Medications:    amLODipine  (NORVASC ) 10 MG tablet, Take 1 tablet (10 mg total) by mouth daily., Disp: 30 tablet, Rfl: 11   atorvastatin  (LIPITOR) 20 MG tablet, Take 1 tablet (20 mg total) by mouth daily., Disp: 90 tablet, Rfl: 3   lamoTRIgine  (LAMICTAL ) 200 MG tablet, Take 200 mg by mouth daily., Disp: , Rfl:    MAGNESIUM CITRATE PO, Take by mouth., Disp: , Rfl:    metoprolol  succinate (TOPROL -XL) 50 MG 24 hr tablet, Take 1 tablet (50 mg total) by mouth daily. Take with or immediately following a meal., Disp: 30 tablet, Rfl: 11   omeprazole  (PRILOSEC) 40 MG capsule, TAKE 1 CAPSULE(40 MG) BY MOUTH DAILY, Disp: 90 capsule, Rfl: 1   sertraline (ZOLOFT) 50 MG tablet, Take 50 mg by mouth daily., Disp: , Rfl:    tirzepatide  (ZEPBOUND ) 2.5 MG/0.5ML injection vial, Inject 2.5 mg into the skin once a week., Disp: 2 mL, Rfl: 0   traZODone (DESYREL) 50 MG tablet, Take 50-100 mg by mouth at bedtime as needed., Disp: , Rfl:    triamterene -hydrochlorothiazide (MAXZIDE-25) 37.5-25 MG tablet, Take 1 tablet by mouth daily., Disp: 30 tablet, Rfl: 11   valsartan  (DIOVAN ) 320 MG tablet, Take 1 tablet (320 mg total) by mouth daily., Disp: 30 tablet, Rfl: 11   vitamin B-12 (CYANOCOBALAMIN ) 100 MCG tablet, Take 100 mcg by mouth daily., Disp: , Rfl:    Vitamin D , Ergocalciferol , (DRISDOL ) 1.25 MG (50000 UNIT) CAPS capsule, Take 1 capsule (50,000 Units total) by mouth once a week., Disp: 12 capsule,  Rfl: 3    ALLERGIES   Sulfa antibiotics, Levofloxacin, Tramadol, Mirabegron, and Cephalosporins    CT chest 01/21/16 images  reviewed No abnormal findings at this time  LMP  (LMP Unknown)   BP 120/80   Pulse 69   Temp 98.7 F (37.1 C)   Ht 5' 3 (1.6 m)   Wt 197 lb 6.4 oz (89.5 kg)   LMP  (LMP Unknown)   SpO2 97%   BMI 34.97 kg/m      Physical Examination:    General Appearance: No distress  EYES PERRLA, EOM intact.   NECK Supple, No JVD Pulmonary: normal breath sounds, No wheezing.  CardiovascularNormal S1,S2.  No m/r/g.   Abdomen: Benign, Soft, non-tender. Neurology UE/LE 5/5 strength, no focal deficits Ext pulses intact, cap refill intact ALL OTHER ROS ARE NEGATIVE      ASSESSMENT/PLAN   58 year old obese white female seen today for follow-up assessment for severe sleep apnea previously diagnosed in 2019 with AHI of 31 in the setting of obesity and deconditioned state with underlying diastolic dysfunction with a previous history of vocal cord dysfunction with deconditioned state, also some symptoms of tongue biting while sleeping   Severe sleep apnea AHI of 31  Patient noncompliant due to mask issues Patient refuses CPAP therapy at this time Patient refuses oral therapy at this time   No exacerbation at this time No evidence of heart failure at this time No evidence or signs of infection at this time No respiratory distress No fevers, chills, nausea, vomiting, diarrhea No evidence of lower extremity edema No evidence hemoptysis Risk of untreated sleep apnea including cardiac arrhthymias, stroke, DM, pulm HTN death  Obesity -recommend significant weight loss -recommend changing diet  Deconditioned state -Recommend increased daily activity and exercise  Neurology consultation for tongue biting Patient has seen Dr. Maree neurology follow-up as scheduled   At this time her vocal cord dysfunction has completely resolved No further work-up at this time Previous pulmonary function testing was within normal limits Follow-up speech evaluation  Impaired diastolic dysfunction grade 1 Recommend repeat echocardiogram follow-up cardiology  Patient with signs symptoms of insomnia on trazodone Recommend referral to Dr. Jess for further assessment and medical management  MEDICATION ADJUSTMENTS/LABS AND TESTS ORDERED: Avoid  Allergens and Irritants Avoid secondhand smoke Avoid SICK contacts Recommend  Masking  when appropriate Recommend Keep up-to-date with vaccinations Cardiology referral Dr. Jess referral Recommend significant weight loss   CURRENT MEDICATIONS REVIEWED AT LENGTH WITH PATIENT TODAY   Patient  satisfied with Plan of action and management. All questions answered   Follow up 1 year   MEDICATION ADJUSTMENTS/LABS AND TESTS ORDERED:    CURRENT MEDICATIONS REVIEWED AT LENGTH WITH PATIENT TODAY   Patient  satisfied with Plan of action and management. All questions answered   Follow up 1 year   I spent a total of 42 minutes dedicated to the care of this patient on the date of this encounter to include pre-visit review of records, face-to-face time with the patient discussing conditions above, post visit ordering of testing, clinical documentation with the electronic health record, making appropriate referrals as documented, and communicating necessary information to the patient's healthcare team.    The Patient requires high complexity decision making for assessment and support, frequent evaluation and titration of therapies, application of advanced monitoring technologies and extensive interpretation of multiple databases.  Patient satisfied with Plan of action and management. All questions answered    Nickolas Alm Cellar, M.D.  [facility]  Medical Director [facility] [place]

## 2024-04-03 NOTE — Patient Instructions (Signed)
 Please let us  know if you change your mind about CPAP therapy and oral device  Highly recommend weight loss  Recommend follow-up with cardiology  Recommend referral to Dr. Jess for insomnia  Avoid Allergens and Irritants Avoid secondhand smoke Avoid SICK contacts Recommend  Masking  when appropriate Recommend Keep up-to-date with vaccinations

## 2024-04-15 ENCOUNTER — Ambulatory Visit (INDEPENDENT_AMBULATORY_CARE_PROVIDER_SITE_OTHER): Admitting: Sleep Medicine

## 2024-04-15 ENCOUNTER — Encounter: Payer: Self-pay | Admitting: Sleep Medicine

## 2024-04-15 VITALS — BP 100/60 | HR 68 | Temp 98.6°F | Ht 64.0 in | Wt 199.0 lb

## 2024-04-15 DIAGNOSIS — E669 Obesity, unspecified: Secondary | ICD-10-CM

## 2024-04-15 DIAGNOSIS — I10 Essential (primary) hypertension: Secondary | ICD-10-CM

## 2024-04-15 DIAGNOSIS — Z6834 Body mass index (BMI) 34.0-34.9, adult: Secondary | ICD-10-CM

## 2024-04-15 DIAGNOSIS — G4733 Obstructive sleep apnea (adult) (pediatric): Secondary | ICD-10-CM

## 2024-04-15 NOTE — Progress Notes (Signed)
 Name:Brandi Terrell MRN: [id number] DOB: [birth year]   CHIEF COMPLAINT:  OSA F/U   HISTORY OF PRESENT ILLNESS: Brandi Terrell is a 58 y.o. w/ a h/o OSA, HTN, hyperlipidemia, depression and obesity who presents to follow up on OSA. Patient was diagnosed with moderate OSA several months ago. States that she tried CPAP therapy for a few months and discontinued use due to mask discomfort. Reports wanting to try CPAP therapy again. Reports c/o loud snoring and excessive daytime sleepiness. Reports nocturnal awakenings due to unclear reasons, however does not have difficulty falling back to sleep. Reports a 45 lb weight gain over the last few years. Admits to dry mouth and morning headaches. Denies RLS symptoms, dream enactment, cataplexy, hypnagogic or hypnapompic hallucinations. Reports a family history of sleep apnea. Denies drowsy driving. Drinks 1 sodas daily, occasional alcohol use, denies tobacco or illicit drug use.   Bedtime 11 pm Sleep onset 10 mins Rise time 6-6:30 am   EPWORTH SLEEP SCORE 13    04/15/2024    8:00 AM 06/29/2023    3:00 PM 05/03/2018    9:00 AM  Results of the Epworth flowsheet  Sitting and reading 3 2 2   Watching TV 3 3 3   Sitting, inactive in a public place (e.g. a theatre or a meeting) 1 1 2   As a passenger in a car for an hour without a break 1 1 2   Lying down to rest in the afternoon when circumstances permit 3 2 2   Sitting and talking to someone 0 0 0  Sitting quietly after a lunch without alcohol 2 1 2   In a car, while stopped for a few minutes in traffic 0 0 1  Total score 13 10 14      PAST MEDICAL HISTORY :   has a past medical history of Acid reflux, Basal cell carcinoma (BCC) of face, Depression, Hypertension, Inflammatory polyps of colon (HCC), Malignant melanoma of skin of chest (HCC), and Sleep apnea.  has a past surgical history that includes Cholecystectomy (2005) and Appendectomy (1976). Prior to Admission medications   Medication Sig  Start Date End Date Taking? Authorizing Provider  amLODipine  (NORVASC ) 10 MG tablet Take 1 tablet (10 mg total) by mouth daily. 02/28/24  Yes Bedsole, Amy E, MD  atorvastatin  (LIPITOR) 20 MG tablet Take 1 tablet (20 mg total) by mouth daily. 05/05/23  Yes Agbor-Etang, Redell, MD  lamoTRIgine  (LAMICTAL ) 200 MG tablet Take 200 mg by mouth daily. 03/14/22  Yes [provider]  MAGNESIUM CITRATE PO Take by mouth.   Yes [provider]  metoprolol  succinate (TOPROL -XL) 50 MG 24 hr tablet Take 1 tablet (50 mg total) by mouth daily. Take with or immediately following a meal. 02/28/24  Yes Bedsole, Amy E, MD  omeprazole  (PRILOSEC) 40 MG capsule TAKE 1 CAPSULE(40 MG) BY MOUTH DAILY 03/28/24  Yes Bedsole, Amy E, MD  sertraline (ZOLOFT) 50 MG tablet Take 50 mg by mouth daily. 02/09/24  Yes [provider]  tirzepatide  (ZEPBOUND ) 2.5 MG/0.5ML injection vial Inject 2.5 mg into the skin once a week. 03/13/24  Yes Bedsole, Amy E, MD  traZODone (DESYREL) 50 MG tablet Take 50-100 mg by mouth at bedtime as needed. 01/23/24  Yes [provider]  triamterene -hydrochlorothiazide (MAXZIDE-25) 37.5-25 MG tablet Take 1 tablet by mouth daily. 02/28/24  Yes Bedsole, Amy E, MD  valsartan  (DIOVAN ) 320 MG tablet Take 1 tablet (320 mg total) by mouth daily. 02/28/24  Yes Avelina Greig BRAVO, MD  vitamin B-12 (CYANOCOBALAMIN ) 100 MCG tablet Take 100 mcg by mouth daily.   Yes [provider]  Vitamin D , Ergocalciferol , (DRISDOL ) 1.25 MG (50000 UNIT) CAPS capsule Take 1 capsule (50,000 Units total) by mouth once a week. 03/06/24  Yes Bedsole, Amy E, MD   Allergies  Allergen Reactions   Sulfa Antibiotics Hives   Levofloxacin Diarrhea   Tramadol Hives   Mirabegron Other (See Comments)    Blurred vision   Cephalosporins Itching and Rash    FAMILY HISTORY:  family history includes Cancer in her maternal grandfather; Cancer (age of onset: 16) in her father; Depression in her father and mother; Heart  disease in her paternal grandmother; Hypertension in her father, maternal aunt, mother, and paternal grandmother; Skin cancer in her father; Stroke in her maternal grandfather, maternal grandmother, paternal grandfather, and paternal grandmother. SOCIAL HISTORY:  reports that she has never smoked. She has been exposed to tobacco smoke. She has never used smokeless tobacco. She reports current alcohol use. She reports that she does not use drugs.   Review of Systems:  Gen:  Denies  fever, sweats, chills weight loss  HEENT: Denies blurred vision, double vision, ear pain, eye pain, hearing loss, nose bleeds, sore throat Cardiac:  No dizziness, chest pain or heaviness, chest tightness,edema, No JVD Resp:   No cough, -sputum production, -shortness of breath,-wheezing, -hemoptysis,  Gi: Denies swallowing difficulty, stomach pain, nausea or vomiting, diarrhea, constipation, bowel incontinence Gu:  Denies bladder incontinence, burning urine Ext:   Denies Joint pain, stiffness or swelling Skin: Denies  skin rash, easy bruising or bleeding or hives Endoc:  Denies polyuria, polydipsia , polyphagia or weight change Psych:   Denies depression, insomnia or hallucinations  Other:  All other systems negative  VITAL SIGNS: BP 100/60   Pulse 68   Temp 98.6 F (37 C)   Ht 5' 4 (1.626 m)   Wt 199 lb (90.3 kg)   LMP  (LMP Unknown)   SpO2 95%   BMI 34.16 kg/m    Physical Examination:   General Appearance: No distress  EYES PERRLA, EOM intact.   NECK Supple, No JVD Pulmonary: normal breath sounds, No wheezing.  CardiovascularNormal S1,S2.  No m/r/g.   Abdomen: Benign, Soft, non-tender. Skin:   warm, no rashes, no ecchymosis  Extremities: normal, no cyanosis, clubbing. Neuro:without focal findings,  speech normal  PSYCHIATRIC: Mood, affect within normal limits.   ASSESSMENT AND PLAN  OSA I suspect that OSA is likely still present due to clinical presentation. Discussed the consequences of  untreated sleep apnea. Advised not to drive drowsy for safety of patient and others. Will complete reassessment of OSA with a home sleep study and follow up to review results.    HTN Stable, on current management. Following with PCP.   Obesity Counseled patient on diet and lifestyle modification.   MEDICATION ADJUSTMENTS/LABS AND TESTS ORDERED: Recommend Sleep Study   Patient  satisfied with Plan of action and management. All questions answered  Follow up to review HST results and treatment plan.   I spent a total of 52 minutes reviewing chart data, face-to-face evaluation with the patient, counseling and coordination of care as detailed above.    Mileah Hemmer, M.D.  Sleep Medicine  Pulmonary & Critical Care Medicine

## 2024-04-15 NOTE — Patient Instructions (Addendum)
 Brandi Terrell

## 2024-04-19 ENCOUNTER — Encounter: Payer: Self-pay | Admitting: Cardiology

## 2024-04-19 ENCOUNTER — Ambulatory Visit: Attending: Cardiology | Admitting: Cardiology

## 2024-04-19 VITALS — BP 90/58 | HR 71 | Ht 64.0 in | Wt 198.2 lb

## 2024-04-19 DIAGNOSIS — R0609 Other forms of dyspnea: Secondary | ICD-10-CM

## 2024-04-19 DIAGNOSIS — I10 Essential (primary) hypertension: Secondary | ICD-10-CM | POA: Diagnosis not present

## 2024-04-19 DIAGNOSIS — E7800 Pure hypercholesterolemia, unspecified: Secondary | ICD-10-CM | POA: Diagnosis not present

## 2024-04-19 MED ORDER — AMLODIPINE BESYLATE 5 MG PO TABS: 5.0000 mg | ORAL_TABLET | Freq: Every day | ORAL | 3 refills | Status: AC

## 2024-04-19 NOTE — Patient Instructions (Signed)
 Medication Instructions:  - DECREASE norvasc  to 5 mg daily   *If you need a refill on your cardiac medications before your next appointment, please call your pharmacy*  Lab Work: No labs ordered today  If you have labs (blood work) drawn today and your tests are completely normal, you will receive your results only by: MyChart Message (if you have MyChart) OR A paper copy in the mail If you have any lab test that is abnormal or we need to change your treatment, we will call you to review the results.  Testing/Procedures: No test ordered today   Follow-Up: At [facility], you and your health needs are our priority.  As part of our continuing mission to provide you with exceptional heart care, our providers are all part of one team.  This team includes your primary Cardiologist (physician) and Advanced Practice Providers or APPs (Physician Assistants and Nurse Practitioners) who all work together to provide you with the care you need, when you need it.  Your next appointment:   5 month(s)  Provider:   You may see Redell Cave, MD or one of the following Advanced Practice Providers on your designated Care Team:   Lonni Meager, NP Lesley Maffucci, PA-C Bernardino Bring, PA-C Cadence [place], PA-C Tylene Lunch, NP Barnie Hila, NP    We recommend signing up for the patient portal called MyChart.  Sign up information is provided on this After Visit Summary.  MyChart is used to connect with patients for Virtual Visits (Telemedicine).  Patients are able to view lab/test results, encounter notes, upcoming appointments, etc.  Non-urgent messages can be sent to your provider as well.   To learn more about what you can do with MyChart, go to [email or web address].

## 2024-04-19 NOTE — Progress Notes (Signed)
 Cardiology Office Note:    Date:  04/19/2024   ID:  KAMBRE MESSNER, DOB [birth year], MRN [id number]  PCP:  Avelina Greig BRAVO, MD   [facility] Providers Cardiologist:  Redell Cave, MD     Referring MD: Avelina Greig BRAVO, MD   Chief Complaint  Patient presents with   Follow-up     follow up visit, The pt has no complaints of chest pain, chest pressure , some c/o SOB, medciation reviewed verbally with patient. Last 2 weeks b/p has been lower than normal.    History of Present Illness:    Brandi Terrell is a 58 y.o. female with a hx of hypertension, hyperlipidemia, OSA presenting for follow-up.   States having lower than usual BP over the past 2 weeks.  Endorses shortness of breath, fatigue, daytime somnolence over the past several months.  Has untreated sleep apnea, currently working with sleep specialist regarding getting a new sleep study, mask fitting.   Prior notes/testing Echo 12/24 EF 55 to 60% Coronary CT 11/24 no CAD. abdominal CT 03/15/2023 due to abdominal pain, abdominal aortic atherosclerosis noted. CT coronary calcium  score 03/24/2023 was 0. CT coronary calcium  score 2021, score 0 Lexiscan Myoview  12/2015 no significant ischemia Echo 2017 EF 60 to 65%  Past Medical History:  Diagnosis Date   Acid reflux    Basal cell carcinoma (BCC) of face    Depression    Hypertension    Inflammatory polyps of colon (HCC)    Malignant melanoma of skin of chest (HCC)    Sleep apnea     Past Surgical History:  Procedure Laterality Date   APPENDECTOMY  1976   CHOLECYSTECTOMY  2005    Current Medications: Current Meds  Medication Sig   atorvastatin  (LIPITOR) 20 MG tablet Take 1 tablet (20 mg total) by mouth daily.   lamoTRIgine  (LAMICTAL ) 200 MG tablet Take 200 mg by mouth daily.   MAGNESIUM CITRATE PO Take by mouth.   metoprolol  succinate (TOPROL -XL) 50 MG 24 hr tablet Take 1 tablet (50 mg total) by mouth daily. Take with or immediately following a meal.    omeprazole  (PRILOSEC) 40 MG capsule TAKE 1 CAPSULE(40 MG) BY MOUTH DAILY   sertraline (ZOLOFT) 50 MG tablet Take 50 mg by mouth daily.   traZODone (DESYREL) 50 MG tablet Take 50-100 mg by mouth at bedtime as needed.   triamterene -hydrochlorothiazide (MAXZIDE-25) 37.5-25 MG tablet Take 1 tablet by mouth daily.   valsartan  (DIOVAN ) 320 MG tablet Take 1 tablet (320 mg total) by mouth daily.   vitamin B-12 (CYANOCOBALAMIN ) 100 MCG tablet Take 100 mcg by mouth daily.   Vitamin D , Ergocalciferol , (DRISDOL ) 1.25 MG (50000 UNIT) CAPS capsule Take 1 capsule (50,000 Units total) by mouth once a week.   [DISCONTINUED] amLODipine  (NORVASC ) 10 MG tablet Take 1 tablet (10 mg total) by mouth daily.     Allergies:   Sulfa antibiotics, Levofloxacin, Tramadol, Mirabegron, and Cephalosporins   Social History   Socioeconomic History   Marital status: Married    Spouse name: Not on file   Number of children: 0   Years of education: Not on file   Highest education level: Bachelor's degree (e.g., BA, AB, BS)  Occupational History   Occupation: Accountant  Tobacco Use   Smoking status: Never    Passive exposure: Past (Growing up both parents smoked. Second hand smoke exposure.)   Smokeless tobacco: Never  Vaping Use   Vaping status: Never Used  Substance and Sexual Activity  Alcohol use: Yes    Comment: occ   Drug use: No   Sexual activity: Not on file  Other Topics Concern   Not on file  Social History Narrative   Not on file   Social Drivers of Health   Financial Resource Strain: Low Risk  (02/26/2024)   Overall Financial Resource Strain (CARDIA)    Difficulty of Paying Living Expenses: Not hard at all  Food Insecurity: No Food Insecurity (02/26/2024)   Hunger Vital Sign    Worried About Running Out of Food in the Last Year: Never true    Ran Out of Food in the Last Year: Never true  Transportation Needs: No Transportation Needs (02/26/2024)   PRAPARE - Scientist, Research (physical Sciences) (Medical): No    Lack of Transportation (Non-Medical): No  Physical Activity: Inactive (02/26/2024)   Exercise Vital Sign    Days of Exercise per Week: 0 days    Minutes of Exercise per Session: Not on file  Stress: No Stress Concern Present (08/05/2023)   [employer] of Occupational Health - Occupational Stress Questionnaire    Feeling of Stress : Not at all  Social Connections: Unknown (02/26/2024)   Social Connection and Isolation Panel    Frequency of Communication with Friends and Family: Twice a week    Frequency of Social Gatherings with Friends and Family: Patient declined    Attends Religious Services: Patient declined    Database Administrator or Organizations: No    Attends Engineer, Structural: Not on file    Marital Status: Married     Family History: The patient's family history includes Cancer in her maternal grandfather; Cancer (age of onset: 78) in her father; Depression in her father and mother; Heart disease in her paternal grandmother; Hypertension in her father, maternal aunt, mother, and paternal grandmother; Skin cancer in her father; Stroke in her maternal grandfather, maternal grandmother, paternal grandfather, and paternal grandmother.  ROS:   Please see the history of present illness.     All other systems reviewed and are negative.  EKGs/Labs/Other Studies Reviewed:    The following studies were reviewed today:  EKG Interpretation Date/Time:  Friday April 19 2024 14:42:40 EDT Ventricular Rate:  71 PR Interval:  192 QRS Duration:  76 QT Interval:  386 QTC Calculation: 419 R Axis:   12  Text Interpretation: Normal sinus rhythm Cannot rule out Anterior infarct Confirmed by Darliss Rogue ([id number]) on 04/19/2024 2:48:54 PM    Recent Labs: 11/22/2023: ALT 21; BUN 24; Creatinine, Ser 0.76; Hemoglobin 12.2; Platelets 327.0; Potassium 4.4; Sodium 140  Recent Lipid Panel    Component Value Date/Time   CHOL 184 11/22/2023 1032    TRIG 169.0 (H) 11/22/2023 1032   HDL 51.30 11/22/2023 1032   CHOLHDL 4 11/22/2023 1032   VLDL 33.8 11/22/2023 1032   LDLCALC 99 11/22/2023 1032     Risk Assessment/Calculations:             Physical Exam:    VS:  BP (!) 90/58 (BP Location: Left Arm, Patient Position: Sitting, Cuff Size: Normal) Comment: pt has taking meds. today  Pulse 71   Ht 5' 4 (1.626 m)   Wt 198 lb 3.2 oz (89.9 kg)   LMP  (LMP Unknown)   SpO2 96%   BMI 34.02 kg/m     Wt Readings from Last 3 Encounters:  04/19/24 198 lb 3.2 oz (89.9 kg)  04/15/24 199 lb (90.3 kg)  04/03/24 197  lb 6.4 oz (89.5 kg)     GEN:  Well nourished, well developed in no acute distress HEENT: Normal NECK: No JVD; No carotid bruits CARDIAC: RRR, no murmurs, rubs, gallops RESPIRATORY:  Clear to auscultation without rales, wheezing or rhonchi  ABDOMEN: Soft, non-tender, non-distended MUSCULOSKELETAL:  No edema; No deformity  SKIN: Warm and dry NEUROLOGIC:  Alert and oriented x 3 PSYCHIATRIC:  Normal affect   ASSESSMENT:    1. Dyspnea on exertion   2. Primary hypertension   3. Pure hypercholesterolemia     PLAN:    In order of problems listed above:  Dyspnea on exertion, coronary CTA 11/24 no CAD.  Echo 05/2023 normal EF 55 to 60%, impaired relaxation.  No cardiac findings to suggest etiology of dyspnea.  Possible etiology includes deconditioning, untreated OSA.  Symptoms of fatigue, daytime somnolence likely from untreated sleep apnea.  Deconditioning, obesity also contributing.  Agree with Zepbound  as prescribed by PCP.  Advised on low-carb diet, increase activity. Hypertension, BP low normal.  Reduce Norvasc  to 5 mg daily, continue valsartan  320 mg daily, Toprol -XL 50 mg daily, Maxzide 25 mg daily.  Monitor BP over the next week, if systolic stays in the 90s to low 100, advised to stop Norvasc  completely. Hyperlipidemia, continue Lipitor 20 mg daily.  Follow-up in 4 to 5 months     Medication Adjustments/Labs and  Tests Ordered: Current medicines are reviewed at length with the patient today.  Concerns regarding medicines are outlined above.  Orders Placed This Encounter  Procedures   EKG 12-Lead   Meds ordered this encounter  Medications   amLODipine  (NORVASC ) 5 MG tablet    Sig: Take 1 tablet (5 mg total) by mouth daily.    Dispense:  30 tablet    Refill:  3    Patient Instructions  Medication Instructions:  - DECREASE norvasc  to 5 mg daily   *If you need a refill on your cardiac medications before your next appointment, please call your pharmacy*  Lab Work: No labs ordered today  If you have labs (blood work) drawn today and your tests are completely normal, you will receive your results only by: MyChart Message (if you have MyChart) OR A paper copy in the mail If you have any lab test that is abnormal or we need to change your treatment, we will call you to review the results.  Testing/Procedures: No test ordered today   Follow-Up: At [facility], you and your health needs are our priority.  As part of our continuing mission to provide you with exceptional heart care, our providers are all part of one team.  This team includes your primary Cardiologist (physician) and Advanced Practice Providers or APPs (Physician Assistants and Nurse Practitioners) who all work together to provide you with the care you need, when you need it.  Your next appointment:   5 month(s)  Provider:   You may see Redell Cave, MD or one of the following Advanced Practice Providers on your designated Care Team:   Lonni Meager, NP Lesley Maffucci, PA-C Bernardino Bring, PA-C Cadence [place], PA-C Tylene Lunch, NP Barnie Hila, NP    We recommend signing up for the patient portal called MyChart.  Sign up information is provided on this After Visit Summary.  MyChart is used to connect with patients for Virtual Visits (Telemedicine).  Patients are able to view lab/test results, encounter  notes, upcoming appointments, etc.  Non-urgent messages can be sent to your provider as well.  To learn more about what you can do with MyChart, go to [email or web address].              Signed, Redell Cave, MD  04/19/2024 4:44 PM    [facility]

## 2024-05-03 ENCOUNTER — Other Ambulatory Visit: Payer: Self-pay | Admitting: Cardiology

## 2024-05-06 ENCOUNTER — Encounter: Payer: Self-pay | Admitting: Family Medicine

## 2024-05-06 MED ORDER — ATORVASTATIN CALCIUM 20 MG PO TABS: 20.0000 mg | ORAL_TABLET | Freq: Every day | ORAL | 1 refills | Status: DC

## 2024-05-28 ENCOUNTER — Ambulatory Visit: Admitting: Family Medicine

## 2024-05-29 ENCOUNTER — Ambulatory Visit: Admitting: Family Medicine

## 2024-06-05 ENCOUNTER — Ambulatory Visit: Admitting: Family Medicine

## 2024-06-05 ENCOUNTER — Ambulatory Visit: Payer: Self-pay | Admitting: Family Medicine

## 2024-06-05 VITALS — BP 122/84 | HR 73 | Temp 99.1°F | Ht 64.0 in | Wt 194.4 lb

## 2024-06-05 DIAGNOSIS — E538 Deficiency of other specified B group vitamins: Secondary | ICD-10-CM | POA: Diagnosis not present

## 2024-06-05 DIAGNOSIS — E215 Disorder of parathyroid gland, unspecified: Secondary | ICD-10-CM

## 2024-06-05 DIAGNOSIS — E210 Primary hyperparathyroidism: Secondary | ICD-10-CM | POA: Insufficient documentation

## 2024-06-05 DIAGNOSIS — R251 Tremor, unspecified: Secondary | ICD-10-CM | POA: Insufficient documentation

## 2024-06-05 DIAGNOSIS — R1013 Epigastric pain: Secondary | ICD-10-CM | POA: Diagnosis not present

## 2024-06-05 DIAGNOSIS — R7989 Other specified abnormal findings of blood chemistry: Secondary | ICD-10-CM

## 2024-06-05 LAB — COMPREHENSIVE METABOLIC PANEL WITH GFR
ALT: 12 U/L (ref 3–35)
AST: 14 U/L (ref 5–37)
Albumin: 4.6 g/dL (ref 3.5–5.2)
Alkaline Phosphatase: 115 U/L (ref 39–117)
BUN: 26 mg/dL — ABNORMAL HIGH (ref 6–23)
CO2: 33 meq/L — ABNORMAL HIGH (ref 19–32)
Calcium: 11.1 mg/dL — ABNORMAL HIGH (ref 8.4–10.5)
Chloride: 102 meq/L (ref 96–112)
Creatinine, Ser: 1.2 mg/dL (ref 0.40–1.20)
GFR: 49.11 mL/min — ABNORMAL LOW (ref >60.00)
Glucose, Bld: 103 mg/dL — ABNORMAL HIGH (ref 70–99)
Potassium: 4.4 meq/L (ref 3.5–5.1)
Sodium: 140 meq/L (ref 135–145)
Total Bilirubin: 0.6 mg/dL (ref 0.2–1.2)
Total Protein: 7.3 g/dL (ref 6.0–8.3)

## 2024-06-05 LAB — CBC WITH DIFFERENTIAL/PLATELET
Basophils Absolute: 0 K/uL (ref 0.0–0.1)
Basophils Relative: 0.4 % (ref 0.0–3.0)
Eosinophils Absolute: 0.1 K/uL (ref 0.0–0.7)
Eosinophils Relative: 1.3 % (ref 0.0–5.0)
HCT: 35.7 % — ABNORMAL LOW (ref 36.0–46.0)
Hemoglobin: 11.9 g/dL — ABNORMAL LOW (ref 12.0–15.0)
Lymphocytes Relative: 34.2 % (ref 12.0–46.0)
Lymphs Abs: 2.5 K/uL (ref 0.7–4.0)
MCHC: 33.4 g/dL (ref 30.0–36.0)
MCV: 84.2 fl (ref 78.0–100.0)
Monocytes Absolute: 0.4 K/uL (ref 0.1–1.0)
Monocytes Relative: 5.8 % (ref 3.0–12.0)
Neutro Abs: 4.3 K/uL (ref 1.4–7.7)
Neutrophils Relative %: 58.3 % (ref 43.0–77.0)
Platelets: 303 K/uL (ref 150.0–400.0)
RBC: 4.24 Mil/uL (ref 3.87–5.11)
RDW: 16 % — ABNORMAL HIGH (ref 11.5–15.5)
WBC: 7.4 K/uL (ref 4.0–10.5)

## 2024-06-05 LAB — VITAMIN D 25 HYDROXY (VIT D DEFICIENCY, FRACTURES): VITD: 36.05 ng/mL (ref 30.00–100.00)

## 2024-06-05 LAB — TSH: TSH: 2.29 u[IU]/mL (ref 0.35–5.50)

## 2024-06-05 LAB — LIPASE: Lipase: 45 U/L (ref 11.0–59.0)

## 2024-06-05 LAB — VITAMIN B12: Vitamin B-12: 685 pg/mL (ref 211–911)

## 2024-06-05 MED ORDER — NYSTATIN 100000 UNIT/GM EX CREA: 1.0000 | TOPICAL_CREAM | Freq: Two times a day (BID) | CUTANEOUS | 1 refills | Status: AC

## 2024-06-05 NOTE — Assessment & Plan Note (Signed)
 Acute, differential includes gastritis, peptic ulcer or duodenal ulcer, pancreatitis.  Patient is status post cholecystectomy... less likely retained stone given previous nml LFTs. Will  re-eval with labs to check CBC, lipase and complete metabolic panel.   Neg H. pylori breath test in 2024 Last year lipase was elevated but CT normal.   Will move forward with referral to ENDO for possible endoscopy.   Continue omeprazole  to 40 mg p.o. daily.  Return and ER precautions provided

## 2024-06-05 NOTE — Progress Notes (Signed)
 Patient ID: Brandi Terrell, female    DOB: 1966-06-10, 58 y.o.   MRN: [id number]  This visit was conducted in person.  BP 122/84   Pulse 73   Temp 99.1 F (37.3 C) (Oral)   Ht 5' 4 (1.626 m)   Wt 194 lb 6 oz (88.2 kg)   LMP  (LMP Unknown)   SpO2 94%   BMI 33.36 kg/m    CC:  Chief Complaint  Patient presents with   Medical Management of Chronic Issues    Pt CC is upper abdominal pain x 3-4 weeks. Pt has had diarrhea 2-3 weeks. Pt also want to talk about issues with  yeast located under her breast and stomach. Pt has been have shakes in her hands and Left leg.    Subjective:   HPI: Brandi Terrell is a 58 y.o. female presenting on 06/05/2024 for Medical Management of Chronic Issues (Pt CC is upper abdominal pain x 3-4 weeks. Pt has had diarrhea 2-3 weeks. Pt also want to talk about issues with  yeast located under her breast and stomach. Pt has been have shakes in her hands and Left leg.)   Multiple concerns today   Primary issue is conitnue epigastric pain.. noted first 1 year ago, but resolved.. now  in last 3-4 weeks she has noted upper abdominal pain ( mild ache), associated with diarrhea 2-3 weeks. No change in pain with BM. Multiple BMs a day, 3-4 times in AM.  Epigastric pain has become daily... notes most when waking in AM... improves after a few hours.  Decreased appetite in last few months.  No diet changes.  Epigastric pain feels better with eating something in AM.  No heartburn or reflux  on omeprazole  40 mg daily... if misses a day has some.    Labs reviewed from 11/2023 nml cbc, CMET   CT abd : nml in 02/2023 eval for similar issue... lipase was 231, but on recehck 35.  Improved with increasing omeprazole  to 40 mg and clear liquids given concern for pancreatitis.  S/p cholecystecomy   Tremor noted in hands x 4 weeks.. in last   2 weeks shaking at night in left leg  Has cut back to 100 sertraline.SABRA as felt could be SE.   2 great aunts with parkinson's Mom  had tremor in hands.  Relevant past medical, surgical, family and social history reviewed and updated as indicated. Interim medical history since our last visit reviewed. Allergies and medications reviewed and updated. Outpatient Medications Prior to Visit  Medication Sig Dispense Refill   sertraline (ZOLOFT) 50 MG tablet Take 50 mg by mouth daily. (Patient taking differently: Take 50 mg by mouth daily. Pt taking 100mg )     amLODipine  (NORVASC ) 5 MG tablet Take 1 tablet (5 mg total) by mouth daily. 30 tablet 3   atorvastatin  (LIPITOR) 20 MG tablet TAKE 1 TABLET(20 MG) BY MOUTH DAILY (Patient not taking: Reported on 06/05/2024) 90 tablet 3   atorvastatin  (LIPITOR) 20 MG tablet Take 1 tablet (20 mg total) by mouth daily. 90 tablet 1   lamoTRIgine  (LAMICTAL ) 200 MG tablet Take 200 mg by mouth daily.     MAGNESIUM CITRATE PO Take by mouth.     metoprolol  succinate (TOPROL -XL) 50 MG 24 hr tablet Take 1 tablet (50 mg total) by mouth daily. Take with or immediately following a meal. 30 tablet 11   omeprazole  (PRILOSEC) 40 MG capsule TAKE 1 CAPSULE(40 MG) BY MOUTH DAILY 90 capsule 1  tirzepatide  (ZEPBOUND ) 2.5 MG/0.5ML injection vial Inject 2.5 mg into the skin once a week. (Patient not taking: Reported on 04/19/2024) 2 mL 0   traZODone (DESYREL) 50 MG tablet Take 50-100 mg by mouth at bedtime as needed.     triamterene -hydrochlorothiazide (MAXZIDE-25) 37.5-25 MG tablet Take 1 tablet by mouth daily. 30 tablet 11   valsartan  (DIOVAN ) 320 MG tablet Take 1 tablet (320 mg total) by mouth daily. 30 tablet 11   vitamin B-12 (CYANOCOBALAMIN ) 100 MCG tablet Take 100 mcg by mouth daily.     Vitamin D , Ergocalciferol , (DRISDOL ) 1.25 MG (50000 UNIT) CAPS capsule Take 1 capsule (50,000 Units total) by mouth once a week. 12 capsule 3   No facility-administered medications prior to visit.     Per HPI unless specifically indicated in ROS section below Review of Systems  Constitutional:  Negative for fatigue and  fever.  HENT:  Negative for congestion.   Eyes:  Negative for pain.  Respiratory:  Negative for cough and shortness of breath.   Cardiovascular:  Negative for chest pain, palpitations and leg swelling.  Gastrointestinal:  Positive for abdominal pain and diarrhea.  Genitourinary:  Negative for dysuria and vaginal bleeding.  Musculoskeletal:  Negative for back pain.  Neurological:  Positive for tremors. Negative for syncope, light-headedness and headaches.  Psychiatric/Behavioral:  Negative for dysphoric mood.    Objective:  BP 122/84   Pulse 73   Temp 99.1 F (37.3 C) (Oral)   Ht 5' 4 (1.626 m)   Wt 194 lb 6 oz (88.2 kg)   LMP  (LMP Unknown)   SpO2 94%   BMI 33.36 kg/m   Wt Readings from Last 3 Encounters:  06/05/24 194 lb 6 oz (88.2 kg)  04/19/24 198 lb 3.2 oz (89.9 kg)  04/15/24 199 lb (90.3 kg)      Physical Exam Constitutional:      General: She is not in acute distress.    Appearance: Normal appearance. She is well-developed. She is not ill-appearing or toxic-appearing.  HENT:     Head: Normocephalic.     Right Ear: Hearing, tympanic membrane, ear canal and external ear normal. Tympanic membrane is not erythematous, retracted or bulging.     Left Ear: Hearing, tympanic membrane, ear canal and external ear normal. Tympanic membrane is not erythematous, retracted or bulging.     Nose: No mucosal edema or rhinorrhea.     Right Sinus: No maxillary sinus tenderness or frontal sinus tenderness.     Left Sinus: No maxillary sinus tenderness or frontal sinus tenderness.     Mouth/Throat:     Pharynx: Uvula midline.  Eyes:     General: Lids are normal. Lids are everted, no foreign bodies appreciated.     Conjunctiva/sclera: Conjunctivae normal.     Pupils: Pupils are equal, round, and reactive to light.  Neck:     Thyroid : No thyroid  mass or thyromegaly.     Vascular: No carotid bruit.     Trachea: Trachea normal.  Cardiovascular:     Rate and Rhythm: Normal rate and  regular rhythm.     Pulses: Normal pulses.     Heart sounds: Normal heart sounds, S1 normal and S2 normal. No murmur heard.    No friction rub. No gallop.  Pulmonary:     Effort: Pulmonary effort is normal. No tachypnea or respiratory distress.     Breath sounds: Normal breath sounds. No decreased breath sounds, wheezing, rhonchi or rales.  Abdominal:  General: Bowel sounds are normal.     Palpations: Abdomen is soft.     Tenderness: There is no abdominal tenderness.  Musculoskeletal:     Cervical back: Normal range of motion and neck supple.  Skin:    General: Skin is warm and dry.     Findings: No rash.  Neurological:     Mental Status: She is alert and oriented to person, place, and time.     Cranial Nerves: Cranial nerves 2-12 are intact.     Sensory: Sensation is intact.     Motor: Tremor present.     Coordination: Romberg sign negative. Coordination normal. Finger-Nose-Finger Test normal.     Comments:  Flat affect  Psychiatric:        Mood and Affect: Mood is not anxious or depressed.        Speech: Speech normal.        Behavior: Behavior normal. Behavior is cooperative.        Thought Content: Thought content normal.        Judgment: Judgment normal.       Results for orders placed or performed in visit on 02/28/24  VITAMIN D  25 Hydroxy (Vit-D Deficiency, Fractures)   Collection Time: 02/28/24  9:37 AM  Result Value Ref Range   VITD 24.30 (L) 30.00 - 100.00 ng/mL    Assessment and Plan  Epigastric abdominal pain Assessment & Plan: Acute, differential includes gastritis, peptic ulcer or duodenal ulcer, pancreatitis.  Patient is status post cholecystectomy... less likely retained stone given previous nml LFTs. Will  re-eval with labs to check CBC, lipase and complete metabolic panel.   Neg H. pylori breath test in 2024 Last year lipase was elevated but CT normal.   Will move forward with referral to ENDO for possible endoscopy.   Continue omeprazole  to 40 mg  p.o. daily.  Return and ER precautions provided  Orders: -     Ambulatory referral to Gastroenterology -     Comprehensive metabolic panel with GFR -     Lipase -     CBC with Differential/Platelet  Vitamin B12 deficiency Assessment & Plan: On oral B12 but patient would prefer to take injections.  We will recheck B12 level and consider weekly to monthly injections depending on level.  [facility]  Orders: -     Vitamin B12  Tremor Assessment & Plan:  Acute.. most likely due to  medication SE.. sertraline , lamictal .. psychiatrist decreasing doses.  Eval with labs.  No PE finding consistent with parkinson's.  Possible  benign essential tremor.  If not improving consider neuro referral.  Orders: -     TSH  Serum calcium  elevated Assessment & Plan: Noted at last lab visit.  Vitamin D  also low.  Will recheck vitamin D  now after supplementation.  Will also check PTH and repeat calcium .   Orders: -     VITAMIN D  25 Hydroxy (Vit-D Deficiency, Fractures) -     Parathyroid  hormone, intact (no Ca)  Other orders -     Nystatin ; Apply 1 Application topically 2 (two) times daily.  Dispense: 30 g; Refill: 1    No follow-ups on file.   Greig Ring, MD

## 2024-06-05 NOTE — Assessment & Plan Note (Signed)
 On oral B12 but patient would prefer to take injections.  We will recheck B12 level and consider weekly to monthly injections depending on level.  [facility].

## 2024-06-05 NOTE — Assessment & Plan Note (Signed)
 Noted at last lab visit.  Vitamin D  also low.  Will recheck vitamin D  now after supplementation.  Will also check PTH and repeat calcium .

## 2024-06-05 NOTE — Assessment & Plan Note (Signed)
 Acute.. most likely due to  medication SE.. sertraline , lamictal .. psychiatrist decreasing doses.  Eval with labs.  No PE finding consistent with parkinson's.  Possible  benign essential tremor.  If not improving consider neuro referral.

## 2024-06-06 LAB — PARATHYROID HORMONE, INTACT (NO CA): PTH: 37 pg/mL (ref 16–77)

## 2024-06-06 MED ORDER — CYANOCOBALAMIN 1000 MCG/ML IJ SOLN: 1000.0000 ug | INTRAMUSCULAR | 11 refills | Status: AC

## 2024-07-03 ENCOUNTER — Other Ambulatory Visit: Payer: Self-pay | Admitting: Family Medicine

## 2024-07-26 ENCOUNTER — Ambulatory Visit: Admitting: Family Medicine

## 2024-07-26 VITALS — BP 118/84 | HR 81 | Temp 97.8°F | Ht 64.0 in | Wt 200.4 lb

## 2024-07-26 DIAGNOSIS — R040 Epistaxis: Secondary | ICD-10-CM

## 2024-07-26 DIAGNOSIS — N289 Disorder of kidney and ureter, unspecified: Secondary | ICD-10-CM | POA: Insufficient documentation

## 2024-07-26 DIAGNOSIS — E210 Primary hyperparathyroidism: Secondary | ICD-10-CM

## 2024-07-26 DIAGNOSIS — L821 Other seborrheic keratosis: Secondary | ICD-10-CM | POA: Insufficient documentation

## 2024-07-26 NOTE — Assessment & Plan Note (Addendum)
 Acute change in GFR. Patient's renal function last June was well in the normal range.  Now in December and January GFR 49-54. Patient drinks minimal water and we will work on increasing this. Change in renal function could also be due to the addition of triamterene  hydrochlorothiazide in September.  She will stop this medication and only use as needed. ?  If connected to hypercalcemia: Patient has upcoming consult with surgeon for parathyroidectomy.  Will recheck in 4 weeks.

## 2024-07-26 NOTE — Assessment & Plan Note (Signed)
 New diagnosis Given osteoporosis in forearm, decrease in kidney function and high calcium , patient recommended by Dr. Leafy endocrinology to move forward with parathyroidectomy.  She has upcoming appointment with surgeon for consult.

## 2024-07-26 NOTE — Progress Notes (Signed)
 "   Patient ID: Brandi Terrell, female    DOB: 1966/05/15, 59 y.o.   MRN: [id number]  This visit was conducted in person.  BP 118/84   Pulse 81   Temp 97.8 F (36.6 C) (Temporal)   Ht 5' 4 (1.626 m)   Wt 200 lb 6 oz (90.9 kg)   LMP  (LMP Unknown)   SpO2 97%   BMI 34.39 kg/m    CC:  Chief Complaint  Patient presents with   Epistaxis   Hyper Para Thyroid     Will be having surgery and wants to discuss labs   Skin Lesion    Left Side of Neck    Subjective:   HPI: Brandi Terrell is a 59 y.o. female presenting on 07/26/2024 for Epistaxis, Hyper Para Thyroid  (Will be having surgery and wants to discuss labs), and Skin Lesion (Left Side of Neck)  Hypercalcemia (calcium  persistently 10.4-11.1 since 2023), no improvement with replacing vitamin D . Vit d now normal. Parathyroid  hormone normal Referred to endocrinology Dr. Leafy.  Last note from February 2  reviewed. Given osteoporosis parathyroidectomy is recommended  Referred for surgical consultation... Dr. Luke at [facility].   Decrease in renal function... note since 11/2023.SABRA GFR from 0s to 49-54.  Started  hydrochlorothiazide/triamterene  in 02/2024  Recently having episodes of epistaxis in last week.. has been having daily to every other day from left nostril.. comes out front.  Nasal passage are very dry.  She is using vaseline in nares.  Yesterday did not have bloody nose. Has seen in past for ENT.   Hg 11.9 05/20/2024  She has noted a skin lesion on the left side of her neck.  Noted this AM...  no itching or irritation.  Has skin tags on neck.negative noted 2 new skin lesions on left neck.  Relevant past medical, surgical, family and social history reviewed and updated as indicated. Interim medical history since our last visit reviewed. Allergies and medications reviewed and updated. Outpatient Medications Prior to Visit  Medication Sig Dispense Refill   amLODipine  (NORVASC ) 5 MG tablet Take 1 tablet (5 mg total) by mouth  daily. 30 tablet 3   atorvastatin  (LIPITOR) 20 MG tablet TAKE 1 TABLET(20 MG) BY MOUTH DAILY 90 tablet 3   cyanocobalamin  (VITAMIN B12) 1000 MCG/ML injection Inject 1 mL (1,000 mcg total) into the muscle every 30 (thirty) days. 1 mL 11   lamoTRIgine  (LAMICTAL ) 200 MG tablet Take 200 mg by mouth daily.     MAGNESIUM CITRATE PO Take 2 tablets by mouth daily.     MAGNESIUM GLYCINATE PO Take 1 tablet by mouth daily.     metoprolol  succinate (TOPROL -XL) 50 MG 24 hr tablet Take 1 tablet (50 mg total) by mouth daily. Take with or immediately following a meal. 30 tablet 11   nystatin  cream (MYCOSTATIN ) Apply 1 Application topically 2 (two) times daily. 30 g 1   omeprazole  (PRILOSEC) 40 MG capsule TAKE 1 CAPSULE(40 MG) BY MOUTH DAILY 90 capsule 1   sertraline (ZOLOFT) 50 MG tablet Take 50 mg by mouth daily. (Patient taking differently: Take 50 mg by mouth daily. Pt taking 100mg )     triamterene -hydrochlorothiazide (MAXZIDE-25) 37.5-25 MG tablet Take 1 tablet by mouth daily. 30 tablet 11   valsartan  (DIOVAN ) 320 MG tablet Take 1 tablet (320 mg total) by mouth daily. 30 tablet 11   Vitamin D , Ergocalciferol , (DRISDOL ) 1.25 MG (50000 UNIT) CAPS capsule Take 1 capsule (50,000 Units total) by mouth once a week. 12  capsule 3   atorvastatin  (LIPITOR) 20 MG tablet Take 1 tablet (20 mg total) by mouth daily. 90 tablet 1   tirzepatide  (ZEPBOUND ) 2.5 MG/0.5ML injection vial Inject 2.5 mg into the skin once a week. (Patient not taking: Reported on 04/19/2024) 2 mL 0   traZODone (DESYREL) 50 MG tablet Take 50-100 mg by mouth at bedtime as needed.     No facility-administered medications prior to visit.     Per HPI unless specifically indicated in ROS section below Review of Systems  Constitutional:  Positive for fatigue. Negative for fever.  HENT:  Positive for nosebleeds. Negative for congestion, ear pain, facial swelling, mouth sores, postnasal drip, rhinorrhea, sinus pressure, sinus pain and sneezing.   Eyes:   Negative for pain.  Respiratory:  Negative for cough and shortness of breath.   Cardiovascular:  Negative for chest pain, palpitations and leg swelling.  Gastrointestinal:  Negative for abdominal pain.  Genitourinary:  Negative for dysuria and vaginal bleeding.  Musculoskeletal:  Negative for back pain.  Skin:  Positive for rash.  Neurological:  Negative for syncope, light-headedness and headaches.  Psychiatric/Behavioral:  Negative for dysphoric mood.    Objective:  BP 118/84   Pulse 81   Temp 97.8 F (36.6 C) (Temporal)   Ht 5' 4 (1.626 m)   Wt 200 lb 6 oz (90.9 kg)   LMP  (LMP Unknown)   SpO2 97%   BMI 34.39 kg/m   Wt Readings from Last 3 Encounters:  07/26/24 200 lb 6 oz (90.9 kg)  06/05/24 194 lb 6 oz (88.2 kg)  04/19/24 198 lb 3.2 oz (89.9 kg)      Physical Exam Constitutional:      General: She is not in acute distress.    Appearance: Normal appearance. She is well-developed. She is not ill-appearing or toxic-appearing.  HENT:     Head: Normocephalic.     Right Ear: Hearing, tympanic membrane, ear canal and external ear normal. Tympanic membrane is not erythematous, retracted or bulging.     Left Ear: Hearing, tympanic membrane, ear canal and external ear normal. Tympanic membrane is not erythematous, retracted or bulging.     Nose: No mucosal edema or rhinorrhea.     Right Sinus: No maxillary sinus tenderness or frontal sinus tenderness.     Left Sinus: No maxillary sinus tenderness or frontal sinus tenderness.     Mouth/Throat:     Pharynx: Uvula midline.  Eyes:     General: Lids are normal. Lids are everted, no foreign bodies appreciated.     Conjunctiva/sclera: Conjunctivae normal.     Pupils: Pupils are equal, round, and reactive to light.  Neck:     Thyroid : No thyroid  mass or thyromegaly.     Vascular: No carotid bruit.     Trachea: Trachea normal.  Cardiovascular:     Rate and Rhythm: Normal rate and regular rhythm.     Pulses: Normal pulses.      Heart sounds: Normal heart sounds, S1 normal and S2 normal. No murmur heard.    No friction rub. No gallop.  Pulmonary:     Effort: Pulmonary effort is normal. No tachypnea or respiratory distress.     Breath sounds: Normal breath sounds. No decreased breath sounds, wheezing, rhonchi or rales.  Abdominal:     General: Bowel sounds are normal.     Palpations: Abdomen is soft.     Tenderness: There is no abdominal tenderness.  Musculoskeletal:     Cervical back: Normal  range of motion and neck supple.  Skin:    General: Skin is warm and dry.     Findings: No rash.     Comments: 2 x pale appearing, uniform skin lesions most consistent with seborrheic keratosis, early  Neurological:     Mental Status: She is alert.  Psychiatric:        Mood and Affect: Mood is not anxious or depressed.        Speech: Speech normal.        Behavior: Behavior normal. Behavior is cooperative.        Thought Content: Thought content normal.        Judgment: Judgment normal.       Results for orders placed or performed in visit on 06/05/24  Comprehensive metabolic panel with GFR   Collection Time: 06/05/24  9:10 AM  Result Value Ref Range   Sodium 140 135 - 145 mEq/L   Potassium 4.4 3.5 - 5.1 mEq/L   Chloride 102 96 - 112 mEq/L   CO2 33 (H) 19 - 32 mEq/L   Glucose, Bld 103 (H) 70 - 99 mg/dL   BUN 26 (H) 6 - 23 mg/dL   Creatinine, Ser 8.79 0.40 - 1.20 mg/dL   Total Bilirubin 0.6 0.2 - 1.2 mg/dL   Alkaline Phosphatase 115 39 - 117 U/L   AST 14 5 - 37 U/L   ALT 12 3 - 35 U/L   Total Protein 7.3 6.0 - 8.3 g/dL   Albumin 4.6 3.5 - 5.2 g/dL   GFR 50.88 (L) >39.99 mL/min   Calcium  11.1 (H) 8.4 - 10.5 mg/dL  Lipase   Collection Time: 06/05/24  9:10 AM  Result Value Ref Range   Lipase 45.0 11.0 - 59.0 U/L  CBC with Differential/Platelet   Collection Time: 06/05/24  9:10 AM  Result Value Ref Range   WBC 7.4 4.0 - 10.5 K/uL   RBC 4.24 3.87 - 5.11 Mil/uL   Hemoglobin 11.9 (L) 12.0 - 15.0 g/dL   HCT  64.2 (L) 63.9 - 46.0 %   MCV 84.2 78.0 - 100.0 fl   MCHC 33.4 30.0 - 36.0 g/dL   RDW 83.9 (H) 88.4 - 84.4 %   Platelets 303.0 150.0 - 400.0 K/uL   Neutrophils Relative % 58.3 43.0 - 77.0 %   Lymphocytes Relative 34.2 12.0 - 46.0 %   Monocytes Relative 5.8 3.0 - 12.0 %   Eosinophils Relative 1.3 0.0 - 5.0 %   Basophils Relative 0.4 0.0 - 3.0 %   Neutro Abs 4.3 1.4 - 7.7 K/uL   Lymphs Abs 2.5 0.7 - 4.0 K/uL   Monocytes Absolute 0.4 0.1 - 1.0 K/uL   Eosinophils Absolute 0.1 0.0 - 0.7 K/uL   Basophils Absolute 0.0 0.0 - 0.1 K/uL  TSH   Collection Time: 06/05/24  9:10 AM  Result Value Ref Range   TSH 2.29 0.35 - 5.50 uIU/mL  Vitamin B12   Collection Time: 06/05/24  9:10 AM  Result Value Ref Range   Vitamin B-12 685 211 - 911 pg/mL  VITAMIN D  25 Hydroxy (Vit-D Deficiency, Fractures)   Collection Time: 06/05/24  9:10 AM  Result Value Ref Range   VITD 36.05 30.00 - 100.00 ng/mL  Parathyroid  hormone, intact (no Ca)   Collection Time: 06/05/24  9:10 AM  Result Value Ref Range   PTH 37 16 - 77 pg/mL    Assessment and Plan  Acute renal insufficiency Assessment & Plan: Acute change in GFR. Patient's  renal function last June was well in the normal range.  Now in December and January GFR 49-54. Patient drinks minimal water and we will work on increasing this. Change in renal function could also be due to the addition of triamterene  hydrochlorothiazide in September.  She will stop this medication and only use as needed. ?  If connected to hypercalcemia: Patient has upcoming consult with surgeon for parathyroidectomy.  Will recheck in 4 weeks.  Orders: -     Basic metabolic panel with GFR; Future  Recurrent epistaxis  Seborrheic keratoses  Primary hyperparathyroidism Assessment & Plan: New diagnosis Given osteoporosis in forearm, decrease in kidney function and high calcium , patient recommended by Dr. Leafy endocrinology to move forward with parathyroidectomy.  She has  upcoming appointment with surgeon for consult.      Return in about 4 weeks (around 08/23/2024) for lab only for  renal check.   Greig Ring, MD  "

## 2024-07-26 NOTE — Assessment & Plan Note (Signed)
 Acute, recurrent Patient has seen ENT about 5 years ago for similar... Had vessel cauterized, not sure which nares. Normal nose exam at this time. Patient states she has not had nosebleed in the last 24 hours since adding humidifier nasal Vaseline and stopping blowing nose. She will call ENT for further recommendations if nosebleed recurrent.

## 2024-07-26 NOTE — Patient Instructions (Signed)
"   Increase water intake. Continue to avoid NSAIDs. Change  hydrochlorothiazide/ triamterence to as needed "

## 2024-07-26 NOTE — Assessment & Plan Note (Signed)
 New, lesions on neck, no red flags.  Patient reassured.

## 2024-09-24 ENCOUNTER — Ambulatory Visit: Admitting: Physician Assistant
